# Patient Record
Sex: Male | Born: 1955 | Race: White | Hispanic: No | Marital: Married | Smoking: Former smoker
Health system: Southern US, Community
[De-identification: ages and names within clinical notes are randomized; demographics above are authoritative.]

## PROBLEM LIST (undated history)

## (undated) DIAGNOSIS — L409 Psoriasis, unspecified: Secondary | ICD-10-CM

## (undated) DIAGNOSIS — Z9221 Personal history of antineoplastic chemotherapy: Secondary | ICD-10-CM

## (undated) DIAGNOSIS — C099 Malignant neoplasm of tonsil, unspecified: Secondary | ICD-10-CM

## (undated) DIAGNOSIS — B370 Candidal stomatitis: Principal | ICD-10-CM

## (undated) DIAGNOSIS — B20 Human immunodeficiency virus [HIV] disease: Secondary | ICD-10-CM

## (undated) DIAGNOSIS — C801 Malignant (primary) neoplasm, unspecified: Secondary | ICD-10-CM

## (undated) DIAGNOSIS — K5900 Constipation, unspecified: Secondary | ICD-10-CM

## (undated) DIAGNOSIS — I890 Lymphedema, not elsewhere classified: Principal | ICD-10-CM

## (undated) DIAGNOSIS — Z21 Asymptomatic human immunodeficiency virus [HIV] infection status: Secondary | ICD-10-CM

## (undated) DIAGNOSIS — R070 Pain in throat: Principal | ICD-10-CM

## (undated) DIAGNOSIS — Z923 Personal history of irradiation: Secondary | ICD-10-CM

## (undated) DIAGNOSIS — H109 Unspecified conjunctivitis: Secondary | ICD-10-CM

## (undated) DIAGNOSIS — B1920 Unspecified viral hepatitis C without hepatic coma: Secondary | ICD-10-CM

## (undated) HISTORY — DX: Unspecified conjunctivitis: H10.9

## (undated) HISTORY — DX: Candidal stomatitis: B37.0

## (undated) HISTORY — DX: Pain in throat: R07.0

## (undated) HISTORY — DX: Psoriasis, unspecified: L40.9

## (undated) HISTORY — DX: Asymptomatic human immunodeficiency virus (hiv) infection status: Z21

## (undated) HISTORY — DX: Lymphedema, not elsewhere classified: I89.0

## (undated) HISTORY — DX: Malignant (primary) neoplasm, unspecified: C80.1

## (undated) HISTORY — DX: Unspecified viral hepatitis C without hepatic coma: B19.20

## (undated) HISTORY — DX: Human immunodeficiency virus (HIV) disease: B20

## (undated) HISTORY — DX: Constipation, unspecified: K59.00

---
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
## 1999-11-24 ENCOUNTER — Emergency Department (HOSPITAL_COMMUNITY): Admission: EM | Admit: 1999-11-24 | Discharge: 1999-11-24 | Payer: Self-pay | Admitting: *Deleted

## 2001-08-17 ENCOUNTER — Ambulatory Visit (HOSPITAL_COMMUNITY): Admission: RE | Admit: 2001-08-17 | Discharge: 2001-08-17 | Payer: Self-pay | Admitting: Gastroenterology

## 2001-10-05 ENCOUNTER — Ambulatory Visit (HOSPITAL_COMMUNITY): Admission: RE | Admit: 2001-10-05 | Discharge: 2001-10-05 | Payer: Self-pay | Admitting: Gastroenterology

## 2001-10-05 ENCOUNTER — Encounter: Payer: Self-pay | Admitting: Gastroenterology

## 2001-10-05 ENCOUNTER — Encounter (INDEPENDENT_AMBULATORY_CARE_PROVIDER_SITE_OTHER): Payer: Self-pay | Admitting: *Deleted

## 2001-11-02 ENCOUNTER — Ambulatory Visit (HOSPITAL_COMMUNITY): Admission: RE | Admit: 2001-11-02 | Discharge: 2001-11-02 | Payer: Self-pay | Admitting: Gastroenterology

## 2001-11-15 HISTORY — PX: LIVER BIOPSY: SHX301

## 2002-04-10 ENCOUNTER — Encounter: Payer: Self-pay | Admitting: Emergency Medicine

## 2002-04-10 ENCOUNTER — Emergency Department (HOSPITAL_COMMUNITY): Admission: EM | Admit: 2002-04-10 | Discharge: 2002-04-10 | Payer: Self-pay | Admitting: Physical Therapy

## 2007-02-02 ENCOUNTER — Ambulatory Visit: Payer: Self-pay | Admitting: Gastroenterology

## 2007-10-17 ENCOUNTER — Ambulatory Visit: Payer: Self-pay | Admitting: Gastroenterology

## 2008-11-15 HISTORY — PX: OTHER SURGICAL HISTORY: SHX169

## 2013-11-15 HISTORY — PX: OTHER SURGICAL HISTORY: SHX169

## 2014-03-15 ENCOUNTER — Other Ambulatory Visit: Payer: Self-pay | Admitting: Family Medicine

## 2014-03-15 DIAGNOSIS — R221 Localized swelling, mass and lump, neck: Secondary | ICD-10-CM

## 2014-03-20 ENCOUNTER — Ambulatory Visit
Admission: RE | Admit: 2014-03-20 | Discharge: 2014-03-20 | Disposition: A | Discharge status: Home / Self Care | Payer: BC Managed Care – PPO | Source: Ambulatory Visit | Attending: Family Medicine | Admitting: Family Medicine

## 2014-03-20 DIAGNOSIS — R221 Localized swelling, mass and lump, neck: Secondary | ICD-10-CM

## 2014-03-20 MED ORDER — IOHEXOL 300 MG/ML  SOLN
75.0000 mL | Freq: Once | INTRAMUSCULAR | Status: AC | PRN
  Administered 2014-03-20: 75 mL via INTRAVENOUS

## 2014-03-25 ENCOUNTER — Other Ambulatory Visit: Payer: Self-pay | Admitting: Otolaryngology

## 2014-03-25 DIAGNOSIS — C099 Malignant neoplasm of tonsil, unspecified: Secondary | ICD-10-CM

## 2014-03-25 HISTORY — DX: Malignant neoplasm of tonsil, unspecified: C09.9

## 2014-04-01 ENCOUNTER — Other Ambulatory Visit (HOSPITAL_COMMUNITY): Payer: Self-pay | Admitting: Otolaryngology

## 2014-04-01 ENCOUNTER — Other Ambulatory Visit (HOSPITAL_COMMUNITY): Payer: Self-pay

## 2014-04-01 DIAGNOSIS — C099 Malignant neoplasm of tonsil, unspecified: Secondary | ICD-10-CM

## 2014-04-03 ENCOUNTER — Telehealth: Payer: Self-pay | Admitting: *Deleted

## 2014-04-03 NOTE — Telephone Encounter (Signed)
Per discussion at ENT Conference this morning, called patient to offer attendance at the [facility] the afternoon of 04/17/14 as an option to his Consult appt with Dr. Isidore Moos that morning.  He stated he appreciated the opportunity to meet with several practitoners during the same visit and agreed to clinic attendance.  Initiating navigation as L1 patient (new patient) with this encounter.  Gayleen Orem, RN, BSN, [facility] Head & Neck Oncology Navigator [phone]

## 2014-04-10 ENCOUNTER — Telehealth: Payer: Self-pay | Admitting: *Deleted

## 2014-04-10 NOTE — Telephone Encounter (Signed)
Returned patient's call, answered his questions regarding the [facility] [place] he will be attending next week, the usual  treatment plan for his dx.  He expressed that he has not told his children yet about his dx b/c his son was just married a couple of weeks ago and he wants to have additional information before telling them.  He asked about suggested approach to sharing news, I suggested that he give Polo Riley, CSW, a call to see what assistance she might provide.  He understands that he will be seeing Lauren next week during [place].  He asked about obtaining a second opinion, I encouraged him to ask for a referral next week when he meets with Drs. Alvy Bimler and Isidore Moos.  I explained that the Tom[facility] we offer is state of the art.  Continuing navigation as L1 patient (new patient).

## 2014-04-11 ENCOUNTER — Encounter: Payer: Self-pay | Admitting: Radiation Oncology

## 2014-04-11 NOTE — Progress Notes (Signed)
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 Jodi Marble with a 1 month history of "slight uncomfortable dyshagia, but reported a slight dry cough", no hoarseness or swallowing difficulties." Also has mild right referred otalgia. He was examined by his Medical Physician for strep which was negative and then examined by his dentist who did not discover any abnormalities.  He then had a CT of the neck which showed several apparent necrotic centered nodes in the RIGHT neck level II,III.  Possible asymmetry in the RIGHT parapharyngeal space but difficult to assess due to dental artifact  Biopsies of the Right Tonsil (if applicable) revealed:  02/02/21  The tumor cells are strongly and diffusely positive for p16, HR-HPV surrogate marker  Tonsil, biopsy, right, biopsy - INVASIVE SQUAMOUS CELL CARCINOMA  Nutrition Status:  Weight changes: No  Swallowing status: "mild"  Plans, if any, for PEG tube: None yet  Tobacco/Marijuana/Snuff/ETOH use: smoked 1 PPD x 25 years, stopped 2 years ago.  Past/Anticipated interventions by otolaryngology, if QMG:NOIBBC of Right Tonsil   Past/Anticipated interventions by medical oncology, if any: Unknown  Referrals yet, to any of the following?  Social Work? NO  Dentistry? Personal Dentist Prior to Diaganosis  Swallowing therapy?  Nutrition? NO  Med/Onc? NO  PEG placement? No  SAFETY ISSUES:  Prior radiation? No  Pacemaker/ICD? No  Possible current pregnancy? N/A  Is the patient on methotrexate? 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  Liver Biopsy 2005 -negative

## 2014-04-12 ENCOUNTER — Telehealth: Payer: Self-pay | Admitting: Hematology and Oncology

## 2014-04-12 NOTE — Telephone Encounter (Signed)
C/D 04/12/14 for appt. 04/17/14 °

## 2014-04-15 ENCOUNTER — Ambulatory Visit (HOSPITAL_COMMUNITY)
Admission: RE | Admit: 2014-04-15 | Discharge: 2014-04-15 | Disposition: A | Discharge status: Home / Self Care | Payer: BC Managed Care – PPO | Source: Ambulatory Visit | Attending: Otolaryngology | Admitting: Otolaryngology

## 2014-04-15 DIAGNOSIS — R911 Solitary pulmonary nodule: Secondary | ICD-10-CM | POA: Insufficient documentation

## 2014-04-15 DIAGNOSIS — I700 Atherosclerosis of aorta: Secondary | ICD-10-CM | POA: Insufficient documentation

## 2014-04-15 DIAGNOSIS — C099 Malignant neoplasm of tonsil, unspecified: Secondary | ICD-10-CM | POA: Insufficient documentation

## 2014-04-15 LAB — GLUCOSE, CAPILLARY: Glucose-Capillary: 91 mg/dL (ref 70–99)

## 2014-04-15 MED ORDER — FLUDEOXYGLUCOSE F - 18 (FDG) INJECTION
9.9000 | Freq: Once | INTRAVENOUS | Status: AC | PRN
  Administered 2014-04-15: 9.9 via INTRAVENOUS

## 2014-04-16 ENCOUNTER — Telehealth: Payer: Self-pay | Admitting: *Deleted

## 2014-04-16 NOTE — Telephone Encounter (Signed)
Called patient to answer any additional questions prior to his attendance at the [facility] tomorrow afternoon, reminded him of his 12:15 arrival time, reviewed the check-in process.  He indicated that his wife will be joining him.  Gayleen Orem, RN, BSN, [facility] Head & Neck Oncology Navigator [phone]

## 2014-04-17 ENCOUNTER — Ambulatory Visit: Payer: BC Managed Care – PPO | Attending: Radiation Oncology | Admitting: Physical Therapy

## 2014-04-17 ENCOUNTER — Ambulatory Visit
Admission: RE | Admit: 2014-04-17 | Discharge: 2014-04-17 | Disposition: A | Discharge status: Home / Self Care | Payer: BC Managed Care – PPO | Source: Ambulatory Visit | Attending: Radiation Oncology | Admitting: Radiation Oncology

## 2014-04-17 ENCOUNTER — Encounter: Payer: Self-pay | Admitting: *Deleted

## 2014-04-17 ENCOUNTER — Ambulatory Visit: Payer: BC Managed Care – PPO

## 2014-04-17 ENCOUNTER — Encounter: Payer: Self-pay | Admitting: Hematology and Oncology

## 2014-04-17 ENCOUNTER — Ambulatory Visit: Payer: BC Managed Care – PPO | Admitting: Nutrition

## 2014-04-17 ENCOUNTER — Encounter: Payer: Self-pay | Admitting: Radiation Oncology

## 2014-04-17 ENCOUNTER — Ambulatory Visit (HOSPITAL_BASED_OUTPATIENT_CLINIC_OR_DEPARTMENT_OTHER): Payer: BC Managed Care – PPO | Admitting: Hematology and Oncology

## 2014-04-17 ENCOUNTER — Ambulatory Visit: Payer: BC Managed Care – PPO | Admitting: Radiation Oncology

## 2014-04-17 VITALS — BP 132/71 | HR 67 | Temp 97.7°F | Resp 16 | Ht 68.0 in | Wt 186.6 lb

## 2014-04-17 DIAGNOSIS — B977 Papillomavirus as the cause of diseases classified elsewhere: Secondary | ICD-10-CM

## 2014-04-17 DIAGNOSIS — C099 Malignant neoplasm of tonsil, unspecified: Secondary | ICD-10-CM

## 2014-04-17 DIAGNOSIS — L409 Psoriasis, unspecified: Secondary | ICD-10-CM

## 2014-04-17 DIAGNOSIS — L408 Other psoriasis: Secondary | ICD-10-CM

## 2014-04-17 DIAGNOSIS — R293 Abnormal posture: Secondary | ICD-10-CM | POA: Insufficient documentation

## 2014-04-17 DIAGNOSIS — IMO0001 Reserved for inherently not codable concepts without codable children: Secondary | ICD-10-CM | POA: Insufficient documentation

## 2014-04-17 DIAGNOSIS — Z8619 Personal history of other infectious and parasitic diseases: Secondary | ICD-10-CM | POA: Insufficient documentation

## 2014-04-17 DIAGNOSIS — B1920 Unspecified viral hepatitis C without hepatic coma: Secondary | ICD-10-CM

## 2014-04-17 HISTORY — DX: Malignant neoplasm of tonsil, unspecified: C09.9

## 2014-04-17 NOTE — Assessment & Plan Note (Signed)
We discussed his case in a multidisciplinary fashion and reviewed his pathology and imaging at the most recent tumor Board. I recommend concurrent chemoradiation therapy with high-dose cisplatin.  In preparation for treatment, the patient will need the following tests or referrals, to be arranged   #1 Referral to dentist for full dental evaluation and possible dental extraction  #2 Referral for feeding tube placement.  #3 Infusaport placement #4 Referral to Speech Pathologist  #5 Referral to Nutritionist  #6 Referral to Social Worker  #7 Referral to chemotherapy class to learn about practical tips while on treatment.  #8 Blood work

## 2014-04-17 NOTE — Progress Notes (Signed)
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 male who presented in early April with right neck swelling.  Strep culture was negative, and blood tests unremarkable.  He underwent a CT of his neck on 03-20-14 revealing: 1. There are several complex solid and cystic lesions within the  right neck corresponding with the patient's palpable concern. The  multiplicity is most consistent with necrotic lymphadenopathy and  worrisome for underlying squamous cell carcinoma of the head and  neck. No clear primary malignancy identified, although there is  asymmetric low-density in the right parapharyngeal space. An  inflammatory process is considered less likely.  He was referred to Dr. Erik Obey who appreciated a firm area in the right tonsil.  He performed a right tonsil biopsy on 03-25-14.  Pathology revealed:  INVASIVE SQUAMOUS CELL CARCINOMA; The tumor cells are strongly and diffusely positive for p16, HR-HPV surrogate marker.  PET on 04-15-14 showed: 1. Asymmetric increased radiotracer uptake within the right  parapharyngeal space which may represent site of primary head neck  neoplasm.  2. Multiple hypermetabolic right level 2 lymph nodes compatible with  metastatic adenopathy.  3. No evidence for hypermetabolic metastasis within the chest,  abdomen or pelvis.   He has lost 3 lbs.  No dysphagia, but swallowing sometimes feels "weird."  He smoked 1 PPD x 25 years, and stopped 2 years ago.  He works for [employer], and lives in [place]. 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    PREVIOUS RADIATION THERAPY: No  PAST MEDICAL HISTORY:  has a past medical history of Tonsillar cancer (03/25/14); Cancer; Hepatitis C; and Psoriasis.    PAST SURGICAL HISTORY: Past Surgical History  Procedure Laterality Date  . Splenectomy, total      at the age of 38  . Colonscopy  2010  . Tonsil biopsy    . Liver biopsy      FAMILY HISTORY: family history is negative for Cancer.  SOCIAL HISTORY:  reports that he quit smoking about 2 years ago. His smoking use included Cigarettes. He has a 30 pack-year smoking history. He has never used smokeless tobacco. He reports that he drinks alcohol. He reports that he does not use illicit drugs.  ALLERGIES: Sulfa antibiotics  MEDICATIONS:  No current outpatient prescriptions on file.   No current facility-administered medications for this encounter.    REVIEW OF SYSTEMS:  Notable for that above.   PHYSICAL EXAM:  height is 5' 8"  (1.727 m) and weight is 186 lb 9.6 oz (84.641 kg). His oral temperature is 97.7 F (36.5 C). His blood pressure is 132/71 and his pulse is 67. His respiration is 16 and oxygen saturation is 100%.   General: Alert and oriented, in no acute distress HEENT: Head is normocephalic. Pupils are equally round and reactive to light. Extraocular movements are intact. Oropharynx- slight fullness in right tonsil. Neck: ~3cm lymph node conglomerate in right level 2/3.  no palpable left cervical or bilateral supraclavicular lymphadenopathy. Heart: Regular in rate and rhythm with  no murmurs, rubs, or gallops. Chest: Clear to auscultation bilaterally, with no rhonchi, wheezes, or rales. Abdomen: Soft, nontender, nondistended, with no rigidity or guarding. Extremities: No cyanosis or edema. Lymphatics: No concerning lymphadenopathy. Skin: various psoriatic lesions Musculoskeletal: symmetric strength and muscle tone throughout. Neurologic: Cranial  nerves II through XII are grossly intact. No obvious focalities. Speech is fluent. Coordination is intact. Psychiatric: Judgment and insight are intact. Affect is appropriate.   ECOG = 0  0 - Asymptomatic (Fully active, able to carry on all predisease activities without restriction)  1 - Symptomatic but completely ambulatory (Restricted in physically strenuous activity but ambulatory and able to carry out work of a light or sedentary nature. For example, light housework, office work)  2 - Symptomatic, <50% in bed during the day (Ambulatory and capable of all self care but unable to carry out any work activities. Up and about more than 50% of waking hours)  3 - Symptomatic, >50% in bed, but not bedbound (Capable of only limited self-care, confined to bed or chair 50% or more of waking hours)  4 - Bedbound (Completely disabled. Cannot carry on any self-care. Totally confined to bed or chair)  5 - Death   Eustace Pen MM, Creech RH, Tormey DC, et al. [phone]). "Toxicity and response criteria of the [facility]". [place] Oncol. 5 (6): 649-55   LABORATORY DATA:  No results found for this basename: WBC, HGB, HCT, MCV, PLT   CMP  No results found for this basename: na, k, cl, co2, glucose, bun, creatinine, calcium, prot, albumin, ast, alt, alkphos, bilitot, gfrnonaa, gfraa        RADIOGRAPHY: Ct Soft Tissue Neck W Contrast  03/20/2014   CLINICAL DATA:  58 year old with right neck mass for 3 weeks. Some dysphagia. Evaluate for lipoma versus tumor.  EXAM: CT NECK WITH CONTRAST  TECHNIQUE: Multidetector CT imaging of the neck was performed using the standard protocol following the bolus administration of intravenous contrast.  CONTRAST:  74m OMNIPAQUE IOHEXOL 300 MG/ML  SOLN  COMPARISON:  None.  FINDINGS: Capsules were placed laterally over the patient's palpable concern in the mid right neck. There are several underlying complex masses within the right neck. These demonstrate  central low density, thickened peripheral septations and a few small calcifications. The dominant component measures up to 2.5 x 1.9 x 3.9 cm. This is most consistent with a necrotic level III lymph node. There are additional separate smaller IIA and IIB lymph nodes.  No enlarged lymph nodes are identified within the left neck. There is no generalized inflammatory changes.  No lesions of the pharyngeal mucosal space are seen. There is asymmetric low-density within the right parapharyngeal fat (image 25). This area is suboptimally evaluated due to artifact from the patient's dental ware. The thyroid, submandibular and parotid glands appear normal.  The mastoid air cells and middle ears are clear. The visualized paranasal sinuses are clear. No intracranial abnormalities are identified. The cervical spine appears normal. The lung apices are clear with mild emphysematous changes.  IMPRESSION: 1. There are several complex solid and cystic lesions within the right neck corresponding with the patient's palpable concern. The multiplicity is most consistent with necrotic lymphadenopathy and worrisome for underlying squamous cell carcinoma of the head and neck. No clear primary malignancy identified, although there is asymmetric low-density in the right parapharyngeal space. An inflammatory process is considered less likely. 2. ENT consultation and tissue sampling recommended. 3. These results will be called to the ordering clinician  or representative by the Radiologist Assistant, and communication documented in the PACS Dashboard.   Electronically Signed   By: Camie Patience M.D.   On: 03/20/2014 17:27   Nm Pet Image Initial (pi) Skull Base To Thigh  04/15/2014   CLINICAL DATA:  Initial treatment strategy for tonsillar cancer.  EXAM: NUCLEAR MEDICINE PET SKULL BASE TO THIGH  TECHNIQUE: 9.9 mCi F-18 FDG was injected intravenously. Full-ring PET imaging was performed from the skull base to thigh after the radiotracer. CT data  was obtained and used for attenuation correction and anatomic localization.  FASTING BLOOD GLUCOSE:  Value: 91 mg/dl  COMPARISON:  CT 03/20/2014  FINDINGS: NECK  There is subtle, asymmetric increased radiotracer activity localizing to the right parapharyngeal region. This has an SUV max equal to 6.8. Multi focal hypermetabolic level 2 lymph nodes are identified within the right side of neck. Index right level 2 lymph node has an SUV max equal to 3.5. More laterally there is a necrotic appearing level 2 lymph node within SUV max equal to 3.3. No hypermetabolic contralateral lymph nodes identified.  CHEST  No hypermetabolic mediastinal or hilar nodes. Subpleural nodule along the minor fissure measures 7 mm. No significant FDG uptake is associated with this nodule which is favored to represent a fissural lymph node. The heart size appears normal. There is calcified atherosclerotic disease involving the thoracic and abdominal aorta.  ABDOMEN/PELVIS  No abnormal hypermetabolic activity within the liver, pancreas, adrenal glands, or spleen. Multiple sub cm lymph nodes are identified within the abdomen. No adenopathy identified however. No hypermetabolic lymph nodes in the abdomen or pelvis.  SKELETON  No focal hypermetabolic activity to suggest skeletal metastasis.  IMPRESSION: 1. Asymmetric increased radiotracer uptake within the right parapharyngeal space which may represent site of primary head neck neoplasm. 2. Multiple hypermetabolic right level 2 lymph nodes compatible with metastatic adenopathy. 3. No evidence for hypermetabolic metastasis within the chest, abdomen or pelvis.   Electronically Signed   By: Kerby Moors M.D.   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 He is an excellent candidate for radiotherapy. Plan is as below:   1) He has met with med/onc  to discuss chemotherapy - anticipate concurrent ChRT   1a) Getting second opinion tomorrow with oncologists at [facility] encouraged him to keep these appointments. He is leaning towards keeping care close to home in [place], but will let us know if he decides otherwise.  2 He has been referred to Dr Enrique Sack for dental evaluation/extractions as needed +/- scatter guards   3) today in multidisciplinary clinic he will see Polo Riley from social work for social support  4) today in multidisciplinary clinic he will see nutrition for nutrition support - anticipate PEG tube. We discussed the rationale for this in depth.  4a) Medical Oncology will eventually refer to surgery for PEG tube placement.   5) Will refer to swallowing therapy for dysphagia which can occur during or after chemoradiotherapy.   6) Simulation once cleared by dentistry. Anticipate 7 weeks of RT - 70 Gy in 35 fractions.   7) PT referral for pre-RT assessment / neck measurements due to risk of lymphedema in neck; may benefit from PT for this after completion of radiotherapy. He also may benefit from this for deconditioning after treatment      It was a pleasure  meeting the patient today. We discussed the risks, benefits, and side effects of adjuvant radiotherapy. We talked in detail about acute and late effects. He understands that some of the most bothersome acute effects will be significant soreness of the mouth and throat, changes in taste, changes in salivary function, skin irritation, hair loss, dehydration, weight loss and fatigue. We talked about late effects which include but are not necessarily limited to dysphagia, hypothyroidism, nerve injury, spinal cord injury, dry mouth, trismus, and neck edema. No guarantees of treatment were given. A consent form was signed and placed in the patient's medical record. The patient is enthusiastic about proceeding with treatment. I look forward to participating in the patient's care.    __________________________________________   Eppie Gibson, MD

## 2014-04-17 NOTE — Assessment & Plan Note (Addendum)
He had a liver biopsy which was negative for liver cirrhosis. I would recheck a hepatitis C panel with the next blood draw. To his knowledge, this is not an active infection. It should not impact on decision making regarding his current treatment for tonsil cancer.

## 2014-04-17 NOTE — Progress Notes (Signed)
Pt is a 58 y/o male with Dx of Tonsil CA that will begin chemo and XRT.  Pt seen in clinic today.     HT 68" Wt 187 lbs UBW 187 lbs BMI 28.43  Pt reports good appetite and po intake.  No problems swallowing or wt loss.    Nutrition Dx:  Food and Nutrition related knowledge deficit related to new Dx of Tonsil CA and associated tx as evidenced by no prior need for nutrition related information.  Intervention:  Pt was educated on the importance of good nutrition and wt maintenance during tx.  Encouraged 6 small high kcal/high protein meals/snacks with supplements.  Reviewed tips on coping with sore throat and ways to increase kcals and protein with beverages.  Coupons for Boost plus and Ensure plus provided with recipes for shakes and smoothies.  Pt and pt's wife seemed to have a good understanding of the information and seemed motivated to follow recommendations. Also discussed PEG tube and benefits, if needed, pt open to PEG to help prevent malnutrition, will discuss with MD.   Monitoring, evaluation, goals:  Pt will tolerate adequate kcal and protein to promote wt maintenance and healing.   Next visit:  To be scheduled.

## 2014-04-17 NOTE — Assessment & Plan Note (Signed)
I warned the patient that his psoriasis might flare during treatment due to stress. I recommend we observe this carefully. He does not require any treatment for that right now.

## 2014-04-17 NOTE — Progress Notes (Signed)
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 male is here because of newly diagnosed tonsillar cancer According to the patient, the first initial presentation was due to a right neck swelling with associated sore throat. He saw his primary care provider for evaluation when it did not get better. He also saw the dentist for evaluation for possible dental abscess. CT scan show abnormalities and he was subsequently referred to ENT and had a biopsy which confirmed the diagnosis he denies any hearing deficit, difficulties with chewing food, swallowing difficulties, painful swallowing or abnormal weight loss. His wife thought that he may have changes in his voice. Oncology History   Tonsil cancer, Right, HPV positive,    Primary site: Pharynx - Oropharynx   Staging method: AJCC 7th Edition   Clinical: Stage IVA (T1, N2, M0) signed by Heath Lark, MD on 04/17/2014  1:04 PM   Summary: Stage IVA (T1, N2, M0)       Tonsil cancer   03/20/2014 Imaging Ct scan of neck showed several complex solid and cystic lesions within the right neck and abnormalities in the pharynx   03/25/2014 Procedure 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 Multiple hypermetabolic right level 2 lymph nodes compatible with metastatic adenopathy    MEDICAL HISTORY:  Past Medical History  Diagnosis Date  . 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 Cancer   . Hepatitis C   . Psoriasis     SURGICAL  HISTORY: Past Surgical History  Procedure Laterality Date  . 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 Colonscopy  2010  . Tonsil biopsy    . Liver biopsy      SOCIAL HISTORY: History   Social History  . Marital Status: Married    Spouse Name: N/A    Number of Children: N/A  . Years of Education: N/A   Occupational History  . Not on file.   Social History Main Topics  . Smoking status: Former Smoker -- 1.00 packs/day for 30 years    Types: Cigarettes    Quit date: 11/16/2011  . Smokeless tobacco: Never Used  . Alcohol Use: Yes     Comment: occasional   . Drug Use: No  . Sexual Activity: Yes   Other Topics Concern  . Not on file   Social History Narrative  . No narrative on file    FAMILY HISTORY: Family History  Problem Relation Age of Onset  . Cancer Neg Hx     ALLERGIES:  is allergic to sulfa antibiotics.  MEDICATIONS:  No current outpatient prescriptions on file.   No current facility-administered medications for this visit.    REVIEW OF SYSTEMS:   Constitutional: Denies fevers, chills or abnormal night sweats Eyes: Denies blurriness of vision, double vision or watery eyes Respiratory: Denies cough, dyspnea or wheezes Cardiovascular: Denies palpitation, chest discomfort or lower extremity swelling Gastrointestinal:  Denies nausea, heartburn or change in bowel habits Neurological:Denies numbness, tingling or new weaknesses Behavioral/Psych: Mood is stable, no  new changes  All other systems were reviewed with the patient and are negative.  PHYSICAL EXAMINATION: ECOG PERFORMANCE STATUS: 1 - Symptomatic but completely ambulatory  BP 132/71 HR 67 RR 16 Temp: 36.5  GENERAL:alert, no distress and comfortable SKIN: Noted multiple psoriatic plaques. He has very dry skin.  EYES: normal, conjunctiva are pink and non-injected, sclera clear OROPHARYNX:no exudate, no erythema and lips, buccal mucosa, and tongue normal  NECK: supple, thyroid normal  size, non-tender, without nodularity LYMPH:  Noted palpable multiple lymphadenopathy in the right side of the neck. Non-elsewhere. LUNGS: clear to auscultation and percussion with normal breathing effort HEART: regular rate & rhythm and no murmurs and no lower extremity edema ABDOMEN:abdomen soft, non-tender and normal bowel sounds Musculoskeletal:no cyanosis of digits and no clubbing  PSYCH: alert & oriented x 3 with fluent speech NEURO: no focal motor/sensory deficits  RADIOGRAPHIC STUDIES: I have personally reviewed the radiological images as listed and agreed with the findings in the report. Ct Soft Tissue Neck W Contrast  03/20/2014   CLINICAL DATA:  58 year old with right neck mass for 3 weeks. Some dysphagia. Evaluate for lipoma versus tumor.  EXAM: CT NECK WITH CONTRAST  TECHNIQUE: Multidetector CT imaging of the neck was performed using the standard protocol following the bolus administration of intravenous contrast.  CONTRAST:  71mL OMNIPAQUE IOHEXOL 300 MG/ML  SOLN  COMPARISON:  None.  FINDINGS: Capsules were placed laterally over the patient's palpable concern in the mid right neck. There are several underlying complex masses within the right neck. These demonstrate central low density, thickened peripheral septations and a few small calcifications. The dominant component measures up to 2.5 x 1.9 x 3.9 cm. This is most consistent with a necrotic level III lymph node. There are additional separate smaller IIA and IIB lymph nodes.  No enlarged lymph nodes are identified within the left neck. There is no generalized inflammatory changes.  No lesions of the pharyngeal mucosal space are seen. There is asymmetric low-density within the right parapharyngeal fat (image 25). This area is suboptimally evaluated due to artifact from the patient's dental ware. The thyroid, submandibular and parotid glands appear normal.  The mastoid air cells and middle ears are clear. The visualized paranasal sinuses are  clear. No intracranial abnormalities are identified. The cervical spine appears normal. The lung apices are clear with mild emphysematous changes.  IMPRESSION: 1. There are several complex solid and cystic lesions within the right neck corresponding with the patient's palpable concern. The multiplicity is most consistent with necrotic lymphadenopathy and worrisome for underlying squamous cell carcinoma of the head and neck. No clear primary malignancy identified, although there is asymmetric low-density in the right parapharyngeal space. An inflammatory process is considered less likely. 2. ENT consultation and tissue sampling recommended. 3. These results will be called to the ordering clinician or representative by the Radiologist Assistant, and communication documented in the PACS Dashboard.   Electronically Signed   By: Camie Patience M.D.   On: 03/20/2014 17:27   Nm Pet Image Initial (pi) Skull Base To Thigh  04/15/2014   CLINICAL DATA:  Initial treatment strategy for tonsillar cancer.  EXAM: NUCLEAR MEDICINE PET SKULL BASE TO THIGH  TECHNIQUE: 9.9 mCi F-18 FDG was injected intravenously. Full-ring PET imaging was performed from the skull base to thigh after the radiotracer. CT data was obtained and used for attenuation correction and anatomic localization.  FASTING BLOOD GLUCOSE:  Value: 91 mg/dl  COMPARISON:  CT 03/20/2014  FINDINGS: NECK  There is  subtle, asymmetric increased radiotracer activity localizing to the right parapharyngeal region. This has an SUV max equal to 6.8. Multi focal hypermetabolic level 2 lymph nodes are identified within the right side of neck. Index right level 2 lymph node has an SUV max equal to 3.5. More laterally there is a necrotic appearing level 2 lymph node within SUV max equal to 3.3. No hypermetabolic contralateral lymph nodes identified.  CHEST  No hypermetabolic mediastinal or hilar nodes. Subpleural nodule along the minor fissure measures 7 mm. No significant FDG uptake  is associated with this nodule which is favored to represent a fissural lymph node. The heart size appears normal. There is calcified atherosclerotic disease involving the thoracic and abdominal aorta.  ABDOMEN/PELVIS  No abnormal hypermetabolic activity within the liver, pancreas, adrenal glands, or spleen. Multiple sub cm lymph nodes are identified within the abdomen. No adenopathy identified however. No hypermetabolic lymph nodes in the abdomen or pelvis.  SKELETON  No focal hypermetabolic activity to suggest skeletal metastasis.  IMPRESSION: 1. Asymmetric increased radiotracer uptake within the right parapharyngeal space which may represent site of primary head neck neoplasm. 2. Multiple hypermetabolic right level 2 lymph nodes compatible with metastatic adenopathy. 3. No evidence for hypermetabolic metastasis within the chest, abdomen or pelvis.   Electronically Signed   By: Kerby Moors M.D.   On: 04/15/2014 10:46    ASSESSMENT:  Newly diagnosed squamous cell carcinoma of the Head & Neck, HPV Positive Tonsil cancer We discussed his case in a multidisciplinary fashion and reviewed his pathology and imaging at the most recent tumor Board. I recommend concurrent chemoradiation therapy with high-dose cisplatin.  In preparation for treatment, the patient will need the following tests or referrals, to be arranged   #1 Referral to dentist for full dental evaluation and possible dental extraction  #2 Referral for feeding tube placement.  #3 Infusaport placement #4 Referral to Speech Pathologist  #5 Referral to Nutritionist  #6 Referral to Social Worker  #7 Referral to chemotherapy class to learn about practical tips while on treatment.  #8 Blood work        Hepatitis C He had a liver biopsy which was negative for liver cirrhosis. I would recheck a hepatitis C panel with the next blood draw. To his knowledge, this is not an active infection. It should not impact on decision making regarding  his current treatment for tonsil cancer.  Psoriasis I warned the patient that his psoriasis might flare during treatment due to stress. I recommend we observe this carefully. He does not require any treatment for that right now.   We discussed also about prognosis and natural history of HPV positive tonsil cancer.  Orders Placed This Encounter  Procedures  . CBC with Differential    Standing Status: Standing     Number of Occurrences: 3     Standing Expiration Date: 04/18/2015  . Comprehensive metabolic panel    Standing Status: Standing     Number of Occurrences: 3     Standing Expiration Date: 04/18/2015  . Magnesium    Standing Status: Standing     Number of Occurrences: 3     Standing Expiration Date: 04/18/2015  . Ambulatory Referral to General Surgery for Portacath & Open Gastrostomy Tube    Referral Priority:  Routine    Referral Type:  Surgical    Referral Reason:  Specialty Services Required    Requested Specialty:  General Surgery    Number of Visits Requested:  1  .  Ambulatory Referral to Speech Therapy  (specifically to Garald Balding)    Referral Priority:  Routine    Referral Type:  Speech Therapy    Referral Reason:  Specialty Services Required    Requested Specialty:  Speech Pathology    Number of Visits Requested:  1  . Ambulatory Referral to Dentistry (specifically to Dr. Enrique Sack)    Referral Priority:  Routine    Referral Type:  Consultation    Referral Reason:  Specialty Services Required    Requested Specialty:  Dental General Practice    Number of Visits Requested:  1  . Amb Referral to Nutrition and Diabetic Education (specifically to Ernestene Kiel)    Referral Priority:  Routine    Referral Type:  Consultation    Referral Reason:  Specialty Services Required    Number of Visits Requested:  1  . Ambulatory Referral to Social Work    Referral Priority:  Routine    Referral Type:  Consultation    Referral Reason:  Specialty Services Required    Number of  Visits Requested:  1  . Ambulatory Referral to Physical Therapy    Referral Priority:  Routine    Referral Type:  Physical Medicine    Referral Reason:  Specialty Services Required    Requested Specialty:  Physical Therapy    Number of Visits Requested:  1    All questions were answered. The patient knows to call the clinic with any problems, questions or concerns.    Heath Lark, MD 04/17/2014 7:07 PM

## 2014-04-17 NOTE — Progress Notes (Signed)
Head & Neck Multidisciplinary Clinic Clinical Social Work  Clinical Social Work met with patient/family at head & neck multidisciplinary clinic to offer support and assess for psychosocial needs.  Patient was accompanied by spouse at today's visit.  Christopher Mullins has two children ages 4 and 22, both living in [place].  Patient and spouse shared they are concerned with sharing diagnosis with children and how to support family through cancer process.  CSW provided patient/spouse with practical ideas of how to communicate and cope with cancer (examples: when and how to share patient has cancer, how to keep family and friends updated throughout process, and how to support patient's children).  Christopher Mullins works in [employer] with [employer] and reported employer is very supportive of patient "focusing on surviving and getting through this".  The patient reported his main concern at this time is "starting treatment".  He expressed feeling well supported by family, friends, and coworkers.  CSW will provide patient's spouse with information for cancer support services in [place]- possible counseling resource for patient's children.  Clinical Social Work briefly discussed Clinical Social Work role and [employer] support programs/services.  Clinical Social Work encouraged patient to call with any additional questions or concerns.  ONCBCN DISTRESS SCREENING 04/17/2014  Screening Type Initial Screening  Mark the number that describes how much distress you have been experiencing in the past week 3  Other seen by Merdis Delay, MSW, LCSW, OSW-C Clinical Social Worker [place] [phone]

## 2014-04-18 ENCOUNTER — Telehealth: Payer: Self-pay | Admitting: Hematology and Oncology

## 2014-04-18 NOTE — Telephone Encounter (Signed)
s.w. pt and advised on June appts....pt sched to see car on 6.8 @ 2:45pm///pt sched for PT on 6.10 @ 1:45pm///lvm for Dr. Lawana Chambers office///lvm ofr Mrs. Mullis///nut appt sched on 6.23....sed added tx....Marland Kitchen-pt ok and aware

## 2014-04-19 ENCOUNTER — Telehealth: Payer: Self-pay | Admitting: *Deleted

## 2014-04-19 ENCOUNTER — Other Ambulatory Visit (HOSPITAL_COMMUNITY): Payer: BC Managed Care – PPO | Admitting: Dentistry

## 2014-04-22 ENCOUNTER — Encounter: Payer: Self-pay | Admitting: *Deleted

## 2014-04-22 ENCOUNTER — Ambulatory Visit: Payer: BC Managed Care – PPO

## 2014-04-23 ENCOUNTER — Ambulatory Visit (HOSPITAL_COMMUNITY): Payer: Self-pay | Admitting: Dentistry

## 2014-04-23 ENCOUNTER — Encounter (HOSPITAL_COMMUNITY): Payer: Self-pay | Admitting: Dentistry

## 2014-04-23 ENCOUNTER — Other Ambulatory Visit (HOSPITAL_COMMUNITY): Payer: BC Managed Care – PPO | Admitting: Dentistry

## 2014-04-23 VITALS — BP 111/76 | HR 59 | Temp 98.5°F

## 2014-04-23 DIAGNOSIS — C099 Malignant neoplasm of tonsil, unspecified: Secondary | ICD-10-CM

## 2014-04-23 DIAGNOSIS — IMO0002 Reserved for concepts with insufficient information to code with codable children: Secondary | ICD-10-CM

## 2014-04-23 DIAGNOSIS — M270 Developmental disorders of jaws: Secondary | ICD-10-CM

## 2014-04-23 DIAGNOSIS — K0530 Chronic periodontitis, unspecified: Secondary | ICD-10-CM

## 2014-04-23 DIAGNOSIS — K045 Chronic apical periodontitis: Secondary | ICD-10-CM

## 2014-04-23 DIAGNOSIS — K036 Deposits [accretions] on teeth: Secondary | ICD-10-CM

## 2014-04-23 DIAGNOSIS — M264 Malocclusion, unspecified: Secondary | ICD-10-CM

## 2014-04-23 DIAGNOSIS — Z0189 Encounter for other specified special examinations: Secondary | ICD-10-CM

## 2014-04-23 DIAGNOSIS — K029 Dental caries, unspecified: Secondary | ICD-10-CM

## 2014-04-23 DIAGNOSIS — K08409 Partial loss of teeth, unspecified cause, unspecified class: Secondary | ICD-10-CM

## 2014-04-23 DIAGNOSIS — K08109 Complete loss of teeth, unspecified cause, unspecified class: Secondary | ICD-10-CM

## 2014-04-23 MED ORDER — SODIUM FLUORIDE 1.1 % DT GEL: DENTAL | Status: DC

## 2014-04-23 NOTE — Progress Notes (Signed)
DENTAL CONSULTATION  Date of Consultation:  04/23/2014 Patient Name:   Christopher Mullins Date of Birth:   06-20-1956 Medical Record Number: [id number]  VITALS: BP 111/76  Pulse 59  Temp(Src) 98.5 F (36.9 C) (Oral)   CHIEF COMPLAINT: Patient was referred for a pre-chemoradiation therapy dental consultation.  HPI: Christopher Mullins is a 58 year old male recently diagnosed with squamous cell carcinoma of the right tonsil. Patient with anticipated chemoradiation therapy. Patient is now seen as part of a medically necessary pre-chemoradiation therapy dental protocol examination.  The patient currently denies acute toothache, swellings, or abscesses. Patient was last seen for an exam and cleaning approximately September of 2014. Patient is followed by Dr. Nicki Reaper Minor as his primary dentist. The patient had an implant placed in the area of tooth #30 in 2013 by Dr. Sherren Mocha Owsley(oral surgeon). This currently has a healing abutment and no definitive crown restoration.   PROBLEM LIST: Patient Active Problem List   Diagnosis Date Noted  . Tonsil cancer 04/17/2014    Priority: High  . Hepatitis C 04/17/2014  . Psoriasis 04/17/2014    PMH: Past Medical History  Diagnosis Date  . 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 Cancer   . Hepatitis C   . Psoriasis     PSH: Past Surgical History  Procedure Laterality Date  . 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 Colonscopy  2010  . Tonsil biopsy    . Liver biopsy      ALLERGIES: Allergies  Allergen Reactions  . Sulfa Antibiotics     MEDICATIONS: No current outpatient prescriptions on file.   No current facility-administered medications for this visit.    LABS: No results found for this basename: WBC, HGB, HCT, MCV, PLT   No results found for this basename: na, k, cl, co2, glucose, bun, creatinine, calcium, gfrnonaa, gfraa   No results found for this basename: INR, PROTIME   No results found for this  basename: PTT    SOCIAL HISTORY: History   Social History  . Marital Status: Married    Spouse Name: N/A    Number of Children: N/A  . Years of Education: N/A   Occupational History  . Not on file.   Social History Main Topics  . Smoking status: Former Smoker -- 1.00 packs/day for 30 years    Types: Cigarettes    Quit date: 11/16/2011  . Smokeless tobacco: Never Used  . Alcohol Use: Yes     Comment: occasional   . Drug Use: No  . Sexual Activity: Yes   Other Topics Concern  . Not on file   Social History Narrative  . No narrative on file    FAMILY HISTORY: Family History  Problem Relation Age of Onset  . Cancer Neg Hx      REVIEW OF SYSTEMS: Reviewed with patient and included in the dental record.  Constitutional: Denies fevers, chills or abnormal night sweats  Eyes: Denies blurriness of vision, double vision or watery eyes  Respiratory: Denies cough, dyspnea or wheezes  Cardiovascular: Denies palpitation, chest discomfort or lower extremity swelling  Gastrointestinal: Denies nausea, heartburn or change in bowel habits  Neurological:Denies numbness, tingling or new weaknesses  Behavioral/Psych: Mood is stable, no new changes  All other systems were reviewed with the patient and are negative.  DENTAL HISTORY: CHIEF COMPLAINT: Patient was referred for a pre-chemoradiation therapy dental consultation.  HPI: Christopher Mullins is a 58 year old  male recently diagnosed with squamous cell carcinoma of the right tonsil. Patient with anticipated chemoradiation therapy. Patient is now seen as part of a medically necessary pre-chemoradiation therapy dental protocol examination.  The patient currently denies acute toothache, swellings, or abscesses. Patient was last seen for an exam and cleaning approximately September of 2014. Patient is followed by Dr. Nicki Reaper Minor as his primary dentist. The patient had an implant placed in the area of tooth #30 in 2013 by Dr. Sherren Mocha  Owsley(oral surgeon). This currently has a healing abutment and no definitive crown restoration.   DENTAL EXAMINATION: GENERAL: Patient is a well-developed, well-nourished male in no acute distress. HEAD AND NECK: There is right neck lymphadenopathy that is palpated and noted on CT scan. There is no left neck lymphadenopathy palpated at this time. Patient denies acute TMJ symptoms. INTRAORAL EXAM: Patient has normal saliva. I do NOT see any evidence of oral abscess formation. The patient has a mandibular right torus. The patient has a deep palatal vault.  DENTITION: The patient is missing tooth numbers 1, 16, 17, 30 and 32. There is an implant in the area of tooth #30 that is not currently restored. The patient has multiple malpositioned teeth and maxillary and mandibular anterior crowding. PERIODONTAL: Patient has chronic periodontitis with plaque and calculus accumulations, selective areas of gingival recession and no significant tooth mobility. Incipient bone loss is noted. DENTAL CARIES/SUBOPTIMAL RESTORATIONS: There may be recurrent caries associated with the distal of tooth #13, lingaul caries on #14, and distal facial caries #21.  I will refer back to his primary dentist for evaluation for restorative treatment at this time.  ENDODONTIC: Patient currently denies acute pulpitis symptoms. Patient has a history of occasional sensitivity in the lower left molar area. There may be some periapical radiolucency associated with tooth #19. I will refer to an endodontist for evaluation and treatment as indicated. Patient does have a root canal therapy associated with tooth #14 that was treated by Dr. Tarri Glenn with no persistent symptoms. CROWN AND BRIDGE: There are multiple crown restorations noted on tooth numbers 2, 3, 5, 14, 15, 18, 8, 20, 29, and 31. IMPLANT: There is an endosteal implant in the area numbers 30 that has a healing cap on it. This has NOT been restored with a definitive restoration due to  economic reasons. PROSTHODONTIC: Patient denies having any partial dentures. OCCLUSION:  The patient has a poor occlusal scheme secondary to multiple missing teeth, multiple malpositioned teeth, anterior crowding, and slight anterior open bite.   RADIOGRAPHIC INTERPRETATION: An orthopantogram was taken and this was supplemented with a full series of dental radiographs. The patient is missing tooth numbers 1, 16, 17, 30, and 32. There is an implant in the area of tooth #30. There is incipient bone loss noted. Patient has a root canal therapy associated with tooth #14. Patient has multiple resin, amalgam, and crown or bridge restorations. There appears to be recurrent caries associated with the distal of #13. There may be incipient periapical radiolucency and pathology associated with the roots of tooth #19.   ASSESSMENTS: 1. Squamous cell carcinoma of the right tonsil 2. Pre-chemoradiation therapy dental protocol  3. Chronic periodontitis with bone loss 4. Accretions 5. Gingival recession 6. Missing tooth numbers 1, 16, 17, 30, and 32.  6. Endosteal implant an area of tooth #30. 7. Possible recurrent caries associated with the distal of #13 along with caries of tooth numbers 14 on the lingual, and distal facial of #21 8. Multiple malpositioned teeth. 9.  Malocclusion 10. Possible periapical radiolucency in pathology associated with the roots of tooth #19-endodontic referral was recommended 11. Mandibualr right lingual torus   PLAN/RECOMMENDATIONS: 1. I discussed the risks, benefits, and complications of various treatment options with the patient in relationship to his medical and dental conditions, anticipated chemoradiation therapy, chemoradiation therapy side effects to include xerostomia, radiation caries, trismus, mucositis, taste changes, gum and jawbone changes, and risk for infection and osteoradionecrosis. We discussed various treatment options to include no treatment, multiple  extraction of teeth in the primary field of radiation therapy, alveoloplasty as needed, pre-prosthetic surgery as indicated, periodontal therapy, dental restorations, root canal therapy, crown and bridge therapy, implant therapy, and replacement of missing teeth as indicated. We also discussed fabrication of fluoride trays and scatter protection devices. The patient currently wishes to proceed with second opinion evaluation with Dr. Frederik Schmidt (oral surgeon) concerning extraction of tooth numbers 2 and 31 with alveoloplasty as needed. The patient also agrees with referral to an endodontist for evaluation for root canal therapy of tooth #19. The patient also agrees to followup with his primary dentist, Dr. Nicki Reaper Minor, for evaluation for restoration of tooth numbers 13, 14, and 21 along with periodontal therapy. He also agrees to impressions today for the fabrication of fluoride trays and scatter protection devices.  A prescription for FLUORISHIELD was sent to [facility] long outpatient pharmacy at the patient's request.   2. Discussion of findings with medical team and coordination of future medical and dental care as needed.  I spent 75 minutes face to face with patient and more than 50% of time was spent in counseling and /or coordination of care.   Lenn Cal, DDS

## 2014-04-23 NOTE — Patient Instructions (Signed)

## 2014-04-24 ENCOUNTER — Ambulatory Visit: Payer: BC Managed Care – PPO | Admitting: Physical Therapy

## 2014-04-24 ENCOUNTER — Telehealth: Payer: Self-pay | Admitting: *Deleted

## 2014-04-24 NOTE — Telephone Encounter (Addendum)
Patient called in follow-up to his second-opinion consult at [facility] today.  He stated that overall the information was comparable to that obtained during his visits with Drs. Isidore Moos and Alvy Bimler during his 04/17/14 [facility] [place] visit with 2 notable expceptions:  1) administration of Cisplatin daily at 20 mg/d over 5 days, per a clinical trial currently underway, and 2) feeding tubes are optional.  Patient indicated [place] has 2 overriding strengths: 1) availability of Tomotherapy, and 2) care coordination.  He also noted that he likes the proactive stance on the feeding tube.  While confident he will pursue his tmts at [facility], he wanted to discuss his dx and proposed tmts with his children during a visit over the weekend.  He indicated he will call me next week to confirm his decision.   Gayleen Orem, RN, BSN, [facility] Head & Neck Oncology Navigator [phone]

## 2014-04-24 NOTE — Telephone Encounter (Signed)
Patient's dtr called with questions re: her dad's treatments, SEs, prognosis.  Per conversation with patient on Monday of this week during which he gave me permission to share information with his children, I answered her questions.  She expressed appreciation for the information; stated she felt she had a better understanding of what lies ahead for her dad and how she can support him.  Gayleen Orem, RN, BSN, [facility] Head & Neck Oncology Navigator [phone]

## 2014-04-24 NOTE — Progress Notes (Signed)
Patient called to offer update after visit with his children over the weekend during which he told them about his dx, upcoming procedures,  treatments and probable SEs.  He indicated that I could expect a call from his dtr Merleen Nicely and that it was OK to answer her questions.  He confirmed that he will pursue his tmts at [facility].    Gayleen Orem, RN, BSN, [facility] Head & Neck Oncology Navigator [phone]

## 2014-04-26 ENCOUNTER — Telehealth: Payer: Self-pay | Admitting: Hematology and Oncology

## 2014-04-26 NOTE — Telephone Encounter (Signed)
per staff message from NG moved all appts from 6/22 and 6/23 to 6/29. lmonvm informing pt and confirming next appt for 6/16. pt to get new schedule when he comes in 6/16.

## 2014-04-29 ENCOUNTER — Telehealth: Payer: Self-pay | Admitting: Hematology and Oncology

## 2014-04-29 ENCOUNTER — Telehealth: Payer: Self-pay | Admitting: *Deleted

## 2014-04-29 NOTE — Telephone Encounter (Signed)
Pt left VM this morning says he missed call from Scheduling.  Called back and s/w wife.  Reviewed pt's schedule and she confirmed his appts.  No questions.

## 2014-04-29 NOTE — Telephone Encounter (Signed)
Patient called to report that he is unable to keep his Chemo Ed appt tomorrow as it conflicts with schedule extractions with Dr.Owsley.  I told him I would facilitate rescheduling.  Notified Hassan Rowan who arranged rescheduled appt for 05/07/14.  Continuing navigation as L1 patient (new patient).  Gayleen Orem, RN, BSN, [facility] Head & Neck Oncology Navigator [phone]

## 2014-04-29 NOTE — Telephone Encounter (Signed)
Per message from MD, I have moved appt for treatment from 6/29 to 7/1. Left patient message to call the office for new appts

## 2014-04-29 NOTE — Telephone Encounter (Signed)
returned pt call and r/s appt per pt request dut to pt having tooth extraction...done...pt aware of new d.t

## 2014-04-29 NOTE — Telephone Encounter (Signed)
Documentation under phone call.

## 2014-04-30 ENCOUNTER — Other Ambulatory Visit: Payer: BC Managed Care – PPO

## 2014-05-01 ENCOUNTER — Other Ambulatory Visit: Payer: Self-pay | Admitting: Radiation Oncology

## 2014-05-01 DIAGNOSIS — C099 Malignant neoplasm of tonsil, unspecified: Secondary | ICD-10-CM

## 2014-05-02 ENCOUNTER — Telehealth: Payer: Self-pay | Admitting: *Deleted

## 2014-05-02 NOTE — Telephone Encounter (Signed)
CALLED PATIENT TO INFORM OF LAB ON 05-06-14 @ 12:45 PM, SPOKE WITH HIS WIFE  ANNETTE AND SHE IS AWARE OF THIS APPT.

## 2014-05-06 ENCOUNTER — Other Ambulatory Visit: Payer: Self-pay | Admitting: Hematology and Oncology

## 2014-05-06 ENCOUNTER — Ambulatory Visit: Payer: BC Managed Care – PPO | Admitting: Hematology and Oncology

## 2014-05-06 ENCOUNTER — Encounter: Payer: Self-pay | Admitting: *Deleted

## 2014-05-06 ENCOUNTER — Ambulatory Visit
Admission: RE | Admit: 2014-05-06 | Discharge: 2014-05-06 | Disposition: A | Discharge status: Home / Self Care | Payer: BC Managed Care – PPO | Source: Ambulatory Visit | Attending: Radiation Oncology | Admitting: Radiation Oncology

## 2014-05-06 ENCOUNTER — Encounter: Payer: Self-pay | Admitting: Radiation Oncology

## 2014-05-06 ENCOUNTER — Telehealth (INDEPENDENT_AMBULATORY_CARE_PROVIDER_SITE_OTHER): Payer: Self-pay

## 2014-05-06 ENCOUNTER — Encounter (HOSPITAL_COMMUNITY): Payer: Self-pay | Admitting: Dentistry

## 2014-05-06 ENCOUNTER — Other Ambulatory Visit: Payer: BC Managed Care – PPO

## 2014-05-06 ENCOUNTER — Ambulatory Visit (HOSPITAL_COMMUNITY): Payer: Medicaid - Dental | Admitting: Dentistry

## 2014-05-06 VITALS — BP 122/80 | HR 49 | Temp 97.5°F | Ht 68.0 in

## 2014-05-06 VITALS — BP 118/76 | HR 53 | Temp 97.8°F

## 2014-05-06 DIAGNOSIS — Z931 Gastrostomy status: Secondary | ICD-10-CM | POA: Insufficient documentation

## 2014-05-06 DIAGNOSIS — K08409 Partial loss of teeth, unspecified cause, unspecified class: Secondary | ICD-10-CM

## 2014-05-06 DIAGNOSIS — B1920 Unspecified viral hepatitis C without hepatic coma: Secondary | ICD-10-CM | POA: Insufficient documentation

## 2014-05-06 DIAGNOSIS — Z0189 Encounter for other specified special examinations: Secondary | ICD-10-CM

## 2014-05-06 DIAGNOSIS — B370 Candidal stomatitis: Secondary | ICD-10-CM | POA: Diagnosis not present

## 2014-05-06 DIAGNOSIS — C099 Malignant neoplasm of tonsil, unspecified: Secondary | ICD-10-CM

## 2014-05-06 DIAGNOSIS — R634 Abnormal weight loss: Secondary | ICD-10-CM | POA: Insufficient documentation

## 2014-05-06 LAB — BUN AND CREATININE (CC13)
BUN: 16.9 mg/dL (ref 7.0–26.0)
CREATININE: 0.9 mg/dL (ref 0.7–1.3)

## 2014-05-06 MED ORDER — SODIUM CHLORIDE 0.9 % IJ SOLN
10.0000 mL | Freq: Once | INTRAMUSCULAR | Status: AC
  Administered 2014-05-06: 10 mL via INTRAVENOUS

## 2014-05-06 NOTE — Progress Notes (Signed)
IV start documented in on Doc flow sheet.  Tolerated without complaint.  Patric Dykes with unsuccessful attempt x 2 in the left antecubital region and the right hand.  Successful attempt by Gaspar Garbe, RN in thee right antecubital region at 1500.  Escorted to simulation with his spouse.

## 2014-05-06 NOTE — Addendum Note (Signed)
Encounter addended by: Deirdre Evener, RN on: 05/06/2014  4:22 PM<BR>     Documentation filed: Notes Section

## 2014-05-06 NOTE — Progress Notes (Signed)
05/06/2014  Patient:            Christopher Mullins Date of Birth:  [birth year] MRN:                [id number]  BP 118/76  Pulse 53  Temp(Src) 97.8 F (36.6 C) (Oral)  [name] now presents for insertion of upper and lower fluoride trays and scatter protection devices. The patient had extraction of tooth numbers 2 and 31 with Dr. Benson Norway on 04/30/2014. The patient also had root canal therapy of tooth #19 and 05/02/2014 with Dr. Sue Lush. Patient had a dental cleaning on 04/25/2014 with Dr. Belva Agee. Patient scheduled to have a dental restoration of tooth #13 with Dr. Minor in the near future.  SUBJECTIVE: Patient with minimal discomfort from the root canal therapy and dental extractions.  OBJECTIVE: There is no sign of infection, heme, or ooze coming from dental extraction sites of tooth #2 and 31. Sutures are intact. Healing appears to be progressing well. Sutures are to dissolve on their own.  ASSESSMENT: Postop course is consistent with dental extractions by Dr. Benson Norway and root canal therapy by Dr. Sue Lush.  PROCEDURE: Appliances were tried in and adjusted as needed. [place] Trismus device was previously fabricated 45 mm using 27 sticks. Postop instructions were provided and a written and verbal format concerning the use and care of appliances. All questions were answered.  Plan: 1. Patient to use salt water rinses as needed to aid healing. 2. Patient is cleared to start chemoradiation therapy on 05/15/2014 barring any unanticipated complication. 3. Patient to brush after meals and at bedtime. Patient to use fluoride at bedtime as instructed. 4. Patient to perform trismus exercises daily as instructed. 5. Patient to return to clinic for periodic oral examination in approximately 2-3 weeks during radiation therapy. 6. Patient to call if questions or problems arise before then.  Lenn Cal, DDS

## 2014-05-06 NOTE — Addendum Note (Signed)
Encounter addended by: Deirdre Evener, RN on: 05/06/2014  3:34 PM<BR>     Documentation filed: Inpatient MAR

## 2014-05-06 NOTE — Addendum Note (Signed)
Encounter addended by: Deirdre Evener, RN on: 05/06/2014  3:30 PM<BR>     Documentation filed: Visit Diagnoses, Orders

## 2014-05-06 NOTE — Addendum Note (Signed)
Encounter addended by: Rebecca Eaton, RN on: 05/06/2014  4:05 PM<BR>     Documentation filed: Notes Section

## 2014-05-06 NOTE — Progress Notes (Addendum)
Patient IV Right Antecubital removed, catheter  tip intact,  2x2 gause placed over site and taped, and held pressure 2 minutes arm held up, gave instructions to leave on for a few hours, then can place a bandiad over site for 1 day,patient gave verbal understanding ,d/c home ambulatory steady gait.  BUN and Creat levels were 16.9 and 0.9 respectively and is is not a diabetic. 4:05 PM

## 2014-05-06 NOTE — Patient Instructions (Signed)
Plan: 1. Patient to use salt water rinses as needed to aid healing. 2. Patient is cleared to start chemoradiation therapy on 05/15/2014 barring any unanticipated complication. 3. Patient to brush after meals and at bedtime. Patient to use fluoride at bedtime as instructed. 4. Patient to perform trismus exercises daily as instructed. 5. Patient to return to clinic for periodic oral examination in approximately 2-3 weeks during radiation therapy. 6. Patient to call if questions or problems arise before then.  Lenn Cal, DDS   FLUORIDE TRAYS PATIENT INSTRUCTIONS    Obtain prescription from the pharmacy.  Don't be surprised if it needs to be ordered.  Be sure to let the pharmacy know when you are close to needing a new refill for them to have it ready for you without interruption of Fluoride use.  The best time to use your Fluoride is before bed time.  You must brush your teeth very well and floss before using the Fluoride in order to get the best use out of the Fluoride treatments.  Place 1 drop of Fluoride gel per tooth in the tray.  Place the tray on your lower teeth and your upper teeth.  Make sure the trays are seated all the way.  Remember, they only fit one way on your teeth.  Insert for 5 full minutes.  At the end of the 5 minutes, take the trays out.  SPIT OUT excess. .  Do NOT rinse your mouth!  Do NOT eat or drink after treatments for at least 30 minutes.  This is why the best time for your treatments is before bedtime.  Clean the inside of your Fluoride trays using COLD WATER and a toothbrush.  In order to keep your Trays from discoloring and free from odors, soak them overnight in denture cleaners such as Efferdent.  Do not use bleach or non denture products.  Store the trays in a safe dry place AWAY from any heat until your next treatment.  If anything happens to your Fluoride trays, or they don't fit as well after any dental work, please let us know as  soon as possible.

## 2014-05-06 NOTE — Progress Notes (Signed)
Simulation, IMRT treatment planning, and Special treatment procedure note   outpatient  Diagnosis: head and neck cancer   ICD-9-CM  1. 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 An Aquaplast head and shoulder mask was custom fitted to the patient's anatomy. High-resolution CT axial imaging was obtained of the head and neck with contrast. I verified that the quality of the imaging is good for treatment planning. 1 Medically Necessary Treatment Device was fabricated and supervised by me: Aquaplast mask.   Treatment planning note I plan to treat the patient with helical Tomotherapy, IMRT. I plan to treat the patient's tumor and bilateral neck nodes. I plan to treat to a total dose of 70 Gray in 35  fractions   IMRT planning Note  IMRT is an important modality to deliver adequate dose to the patient's at risk tissues while sparing the patient's normal structures, including the: esophagus, parotid tissue, mandible, brain stem, spinal cord, oral cavity, brachial plexus.  This justifies the use of IMRT in the patient's treatment.   Special Treatment Procedure Note:  The patient will be receiving chemotherapy concurrently. Chemotherapy heightens the risk of side effects. I have considered this during the patient's treatment planning process and will monitor the patient accordingly for side effects on a weekly basis. Concurrent chemotherapy increases the complexity of this patient's treatment and therefore this constitutes a special treatment procedure.  NOTE: I spoke with Dr. Constance Holster, as Dr Erik Obey is out of town this week, about scoping /biopsying the left nasopharynx due to subtle uptake and asymmetry on his PET. I will devise two plans, so both are ready depending on whether the biopsy is positive or not.  -----------------------------------  Eppie Gibson, MD

## 2014-05-06 NOTE — Telephone Encounter (Signed)
Called and spoke to patient regarding appointment tomorrow with Dr. Hassell Done for evaluation of [facility] & Feeding Tube Placement.  Patient to start treatment on 05/15/14 per Dr. Alvy Bimler.  Reviewed with Dr. Hassell Done and advised to have patient be sen in office tomorrow.

## 2014-05-07 ENCOUNTER — Other Ambulatory Visit: Payer: BC Managed Care – PPO

## 2014-05-07 ENCOUNTER — Ambulatory Visit: Payer: BC Managed Care – PPO

## 2014-05-07 ENCOUNTER — Encounter: Payer: Self-pay | Admitting: *Deleted

## 2014-05-07 ENCOUNTER — Encounter: Payer: BC Managed Care – PPO | Admitting: Nutrition

## 2014-05-07 ENCOUNTER — Other Ambulatory Visit: Payer: Self-pay | Admitting: Otolaryngology

## 2014-05-07 ENCOUNTER — Ambulatory Visit (INDEPENDENT_AMBULATORY_CARE_PROVIDER_SITE_OTHER): Payer: BC Managed Care – PPO | Admitting: Surgery

## 2014-05-07 ENCOUNTER — Encounter (INDEPENDENT_AMBULATORY_CARE_PROVIDER_SITE_OTHER): Payer: Self-pay | Admitting: Surgery

## 2014-05-07 VITALS — BP 130/90 | HR 61 | Temp 98.0°F | Resp 14 | Ht 68.0 in | Wt 193.2 lb

## 2014-05-07 DIAGNOSIS — C099 Malignant neoplasm of tonsil, unspecified: Secondary | ICD-10-CM

## 2014-05-07 HISTORY — PX: PHARYNX BIOPSY: SHX1029

## 2014-05-07 NOTE — Patient Instructions (Signed)
Gastrostomy Tube Home Guide A gastrostomy tube is a tube that is surgically placed into the stomach. It is also called a "G-tube." G-tubes are used when a person is unable to eat and drink enough on their own to stay healthy. The tube is inserted into the stomach through a small cut (incision)in the skin. This tube is used for:  Feeding.  Giving medication. GASTROSTOMY TUBE CARE  Wash your hands with soap and water.  Remove the old dressing (if any). Some styles of G-tubes may need a dressing inserted between the skin and the G-tube. Other types of G-tubes do not require a dressing. Ask your health care provider if a dressing is needed.  Check the area where the tube enters the skin (insertion site) for redness, swelling, or pus-like (purulent) drainage. A small amount of clear or tan liquid drainage is normal. Check to make sure scar tissue(skin) is not growing around the insertion site. This could have a raised, bumpy appearance.  A cotton swab can be used to clean the skin around the tube:  When the G-tube is first put in, a normal saline solution or water can be used to clean the skin.  Mild soap and warm water can be used when the skin around the G-tube site has healed.  Roll the cotton swab around the G-tube insertion site to remove any drainage or crusting at the insertion site. STOMACH RESIDUALS Feeding tube residuals are the amount of liquids that are in the stomach at any given time. Residuals may be checked before giving feedings, medications, or as instructed by your health care provider.  Ask your health care provider if there are instances when you would not start tube feedings depending on the amount or type of contents withdrawn from the stomach.  Check residuals by attaching a syringe to the G-tube and pulling back on the syringe plunger. Note the amount, and return the residual back into the stomach. FLUSHING THE G-TUBE  The G-tube should be periodically flushed with  clean warm water to keep it from clogging.  Flush the G-tube after feedings or medications. Draw up 30 mL of warm water in a syringe. Connect the syringe to the G-tube and slowly push the water into the tube.  Do not push feedings, medications, or flushes rapidly. Flush the G-tube gently and slowly.  Only use syringes made for G-tubes to flush medications or feedings.  Your health care provider may want the G-tube flushed more often or with more water. If this is the case, follow your health care provider's instructions. FEEDINGS Your health care provider will determine whether feedings are given as a bolus (a certain amount given at one time and at scheduled times) or whether feedings will be given continuously on a feeding pump.   Formulas should be given at room temperature.  If feedings are continuous, no more than 4 hours worth of feedings should be placed in the feeding bag. This helps prevent spoilage or accidental excess infusion.  Cover and place unused formula in the refrigerator.  If feedings are continuous, stop the feedings when medications or flushes are given. Be sure to restart the feedings.  Feeding bags and syringes should be replaced as instructed by your health care provider. GIVING MEDICATION   In general, it is best if all medications are in a liquid form for G-tube administration. Liquid medications are less likely to clog the G-tube.  Mix the liquid medication with 30 mL (or amount recommended by your health care  provider) of warm water.  Draw up the medication into the syringe.  Attach the syringe to the G-tube and slowly push the mixture into the G-tube.  After giving the medication, draw up 30 mL of warm water in the syringe and slowly flush the G-tube.  For pills or capsules, check with your health care provider first before crushing medications. Some pills are not effective if they are crushed. Some capsules are sustained release medications.  If  appropriate, crush the pill or capsule and mix with 30 mL of warm water. Using the syringe, slowly push the medication through the tube, then flush the tube with another 30 mL of tap water. G-TUBE PROBLEMS G-tube was pulled out.  Cause: May have been pulled out accidentally.  Solutions: Cover the opening with clean dressing and tape. Call your health care provider right away. The G-tube should be put in as soon as possible (within 4 hours) so the G-tube opening (tract) does not close. The G-tube needs to be put in at a health care setting. An X-ray needs to be done to confirm placement before the G-tube can be used again. Redness, irritation, soreness, or foul odor around the gastrostomy site.  Cause: May be caused by leakage or infection.  Solutions: Call your health care provider right away. Large amount of leakage of fluid or mucus-like liquid present (a large amount means it soaks clothing).  Cause: Many reasons could cause the G-tube to leak.  Solutions: Call your health care provider to discuss the amount of leakage. Skin or scar tissue appears to be growing where tube enters skin.   Cause: Tissue growth may develop around the insertion site if the G-tube is moved or pulled on excessively.  Solutions: Secure tube with tape so that excess movement does not occur. Call your health care provider. G-tube is clogged.  Cause: Thick formula or medication.  Solutions: Try to slowly push warm water into the tube with a large syringe. Never try to push any object into the tube to unclog it. Do not force fluid into the G-tube. If you are unable to unclog the tube, call your health care provider right away. TIPS  Head of bed (HOB) position refers to the upright position of a person's upper body.  When giving medications or a feeding bolus, keep the [facility] up as told by your health care provider. Do this during the feeding and for 1 hour after the feeding or medication administration.  If  continuous feedings are being given, it is best to keep the [facility] up as told by your health care provider. When ADLs (activities of daily living) are performed and the [facility] needs to be flat, be sure to turn the feeding pump off. Restart the feeding pump when the [facility] is returned to the recommended height.  Do not pull or put tension on the tube.  To prevent fluid backflow, kink the G-tube before removing the cap or disconnecting a syringe.  Check the G-tube length every day. Measure from the insertion site to the end of the G-tube. If the length is longer than previous measurements, the tube may be coming out. Call your health care provider if you notice increasing G-tube length.  Oral care, such as brushing teeth, must be continued.  You may need to remove excess air (vent) from the G-tube. Your health care provider will tell you if this is needed.  Always call your health care provider if you have questions or problems with the G-tube. SEEK  IMMEDIATE MEDICAL CARE IF:   You have severe abdominal pain, tenderness, or abdominal bloating (distension).  You have nausea or vomiting.  You are constipated or have problems moving your bowels.  The G-tube insertion site is red, swollen, has a foul smell, or has yellow or brown drainage.  You have difficulty breathing or shortness of breath.  You have a fever.  You have a large amount of feeding tube residuals.  The G-tube is clogged and cannot be flushed. MAKE SURE YOU:   Understand these instructions.  Will watch your condition.  Will get help right away if you are not doing well or get worse. Document Released: 01/10/2002 Document Revised: 11/06/2013 Document Reviewed: 07/09/2013 [facility] Patient Information 2015 [place], [place]. This information is not intended to replace advice given to you by your health care provider. Make sure you discuss any questions you have with your health care provider. Care of a Feeding Tube People who have  trouble swallowing or cannot take food or medicine by mouth are sometimes given feeding tubes. A feeding tube can go into the nose and down to the stomach or through the skin in the abdomen and into the stomach or small bowel. Some of the names of these feeding tubes are gastrostomy tubes, PEG lines, nasogastric tubes, and gastrojejunostomy tubes.  SUPPLIES NEEDED TO CARE FOR THE TUBE SITE  Clean gloves.  Clean wash cloth, gauze pads, or soft paper towel.  Cotton swabs.  Skin barrier ointment or cream.  Soap and water.  Pre-cut foam pads or gauze (that go around the tube).  Tube tape. TUBE SITE CARE 1. Have all supplies ready and available. 2. Wash hands well. 3. Put on clean gloves. 4. Remove the soiled foam pad or gauze, if present, that is found under the tube stabilizer. Change the foam pad or gauze daily or when soiled or moist. 5. Check the skin around the tube site for redness, rash, swelling, drainage, or extra tissue growth. If you notice any of these, call your caregiver. 6. Moisten gauze and cotton swabs with water and soap. 7. Wipe the area closest to the tube (right near the stoma) with cotton swabs. Wipe the surrounding skin with moistened gauze. Rinse with water. 8. Dry the skin and stoma site with a dry gauze pad or soft paper towel. Do not use antibiotic ointments at the tube site. 9. If the skin is red, apply a skin barrier cream or ointment (such as petroleum jelly) in a circular motion, using a cotton swab. The cream or ointment will provide a moisture barrier for the skin and helps with wound healing. 10. Apply a new pre-cut foam pad or gauze around the tube. Secure it with tape around the edges. If no drainage is present, foam pads or gauze may be left off. 11. Use tape or an anchoring device to fasten the feeding tube to the skin for comfort or as directed. Rotate where you tape the tube to avoid skin damage from the adhesive. 12. Position the person in a  semi-upright position (30-45 degree angle). 13. Throw away used supplies. 14. Remove gloves. 15. Wash hands. SUPPLIES NEEDED TO FLUSH A FEEDING TUBE  Clean gloves.  60 mL syringe (that connects to the feeding tube).  Towel.  Water. FLUSHING A FEEDING TUBE  1. Have all supplies ready and available. 2. Wash hands well. 3. Put on clean gloves. 4. Draw up 30 mL of water in the syringe. 5. Kink the feeding tube while disconnecting it  from the feeding-bag tubing or while removing the plug at the end of the tube. Kinking closes the tube and prevents secretions in the tube from spilling out. 6. Insert the tip of the syringe into the end of the feeding tube. Release the kink. Slowly inject the water. 7. If unable to inject the water, the person with the feeding tube should lay on his or her left side. The tip of the tube may be against the stomach wall, blocking fluid flow. Changing positions may move the tip away from the stomach wall. After repositioning, try injecting the water again. 8. After injecting the water, remove the syringe. 9. Always flush before giving the first medicine, between medicines, and after the final medicine before starting a feeding. This prevents medicines from clogging the tube. 10. Throw away used supplies. 11. Remove gloves. 12. Wash hands. Document Released: 11/01/2005 Document Revised: 10/18/2012 Document Reviewed: 06/15/2012 [facility] Patient Information 2015 [place], [place]. This information is not intended to replace advice given to you by your health care provider. Make sure you discuss any questions you have with your health care provider.

## 2014-05-07 NOTE — Progress Notes (Signed)
Chief Complaint:  Right tonsillar cancer; need for feeding tube and Port-A-Cath for chemotherapy  History of Present Illness:  Christopher Mullins is an 58 y.o. 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 This was found when he first initially found a mass in his right neck. He is scheduled to begin chemo radiation therapy on July 1.  Because he has had a prior open splenectomy for trauma he will need to have this performed an open or laparoscopic fashion.  At the time of his splenectomy for trauma he received multiple units of blood and from that and contracted hepatitis C. He is not recorded treatment for this as yet.  Past Medical History  Diagnosis Date  . 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 Cancer   . Hepatitis C   . Psoriasis     Past Surgical History  Procedure Laterality Date  . 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 Colonscopy  2010  . Tonsil biopsy    . Liver biopsy      Current Outpatient Prescriptions  Medication Sig Dispense Refill  . amoxicillin (AMOXIL) 500 MG capsule       . HYDROcodone-acetaminophen (NORCO) 10-325 MG per tablet       . sodium fluoride (FLUORISHIELD) 1.1 % GEL dental gel Instill one drop of fluoride per tooth space of fluoride tray. Place over teeth for 5 minutes. Remove. Spit out excess. Repeat nightly.  120 mL  prn   No current facility-administered medications for this visit.   Sulfa antibiotics Family History  Problem Relation Age of Onset  . Cancer Neg Hx    Social History:   reports that he quit smoking about 2 years ago. His smoking use included Cigarettes. He has a 30 pack-year smoking history. He has never used smokeless tobacco. He reports that he drinks alcohol. He reports that he does not use illicit drugs.   REVIEW OF SYSTEMS : Positive for blood transfusions and hepatitis C, prior exploratory laparotomy ; otherwise negative  Physical Exam:   Blood pressure 130/90, pulse 61,  temperature 98 F (36.7 C), resp. rate 14, height 5\' 8"  (1.727 m), weight 193 lb 3.2 oz (87.635 kg). Body mass index is 29.38 kg/(m^2).  Gen:  WDWN white male NAD  Neurological: Alert and oriented to person, place, and time. Motor and sensory function is grossly intact  Head: Normocephalic and atraumatic.  Eyes: Conjunctivae are normal. Pupils are equal, round, and reactive to light. No scleral icterus.  Neck: Normal range of motion. There is a palpable firmness in the right jaw area. No tracheal deviation or thyromegaly present.  Cardiovascular:  SR without murmurs or gallops.  No carotid bruits Breast:  Not examined Respiratory: Effort normal.  No respiratory distress. No chest wall tenderness. Breath sounds normal.  No wheezes, rales or rhonchi.  Abdomen:  Midline incision from splenectomy. GU:  Not examined Musculoskeletal: Normal range of motion. Extremities are nontender. No cyanosis, edema or clubbing noted Lymphadenopathy: No cervical, preauricular, postauricular or axillary adenopathy is present Skin: Skin is warm and dry. No rash noted. No diaphoresis. No erythema. No pallor. Pscyh: Normal mood and affect. Behavior is normal. Judgment and thought content normal.   LABORATORY RESULTS: Results for orders placed during the hospital encounter of 05/06/14 (from the past 48 hour(s))  BUN AND CREATININE (CC13)     Status: None   Collection Time    05/06/14 12:58 PM  Result Value Ref Range   BUN 16.9  7.0 - 26.0 mg/dL   Creatinine 0.9  0.7 - 1.3 mg/dL     RADIOLOGY RESULTS: No results found.  Problem List: Patient Active Problem List   Diagnosis Date Noted  . Tonsil cancer 04/17/2014  . Hepatitis C 04/17/2014  . Psoriasis 04/17/2014    Assessment & Plan: Tonsillar cancer on the right side with prior laparotomy precluding PEG.  Plan left subclavian portacath and placement of lap/open G tube    Matt B. Hassell Done, MD, [facility] [phone]  beeper [phone]  05/07/2014 2:59 PM

## 2014-05-08 ENCOUNTER — Encounter (HOSPITAL_COMMUNITY): Payer: Self-pay | Admitting: Pharmacy Technician

## 2014-05-08 NOTE — Patient Instructions (Addendum)
[place]  05/09/2014   Your procedure is scheduled on: 05/10/14  Report to [facility] at 05:30 AM.  Call this number if you have problems the morning of surgery [phone]: [phone]   Remember:   Do not eat food or drink liquids After Midnight.     Take these medicines the morning of surgery with A SIP OF WATER: amoxicillin   Do not wear jewelry, make-up or nail polish.  Do not wear lotions, powders, or perfumes. You may wear deodorant.  Do not shave 48 hours prior to surgery. Men may shave face and neck.  Do not bring valuables to the hospital.  Contacts, dentures or bridgework may not be worn into surgery.     Patients discharged the day of surgery will not be allowed to drive home.  Name and phone number of your driver:Anne Ng [phone]    Paulette Blanch, RN  pre op nurse call if needed [phone]    [facility] - Preparing for Surgery Before surgery, you can play an important role.  Because skin is not sterile, your skin needs to be as free of germs as possible.  You can reduce the number of germs on your skin by washing with CHG (chlorahexidine gluconate) soap before surgery.  CHG is an antiseptic cleaner which kills germs and bonds with the skin to continue killing germs even after washing. Please DO NOT use if you have an allergy to CHG or antibacterial soaps.  If your skin becomes reddened/irritated stop using the CHG and inform your nurse when you arrive at Short Stay. Do not shave (including legs and underarms) for at least 48 hours prior to the first CHG shower.  You may shave your face/neck. Please follow these instructions carefully:  1.  Shower with CHG Soap the night before surgery and the  morning of Surgery.  2.  If you choose to wash your hair, wash your hair first as usual with your  normal  shampoo.  3.  After you shampoo, rinse your hair and body thoroughly to remove the  shampoo.                            4.  Use CHG as you would any  other liquid soap.  You can apply chg directly  to the skin and wash                       Gently with a scrungie or clean washcloth.  5.  Apply the CHG Soap to your body ONLY FROM THE NECK DOWN.   Do not use on face/ open                           Wound or open sores. Avoid contact with eyes, ears mouth and genitals (private parts).                       Wash face,  Genitals (private parts) with your normal soap.             6.  Wash thoroughly, paying special attention to the area where your surgery  will be performed.  7.  Thoroughly rinse your body with warm water from the neck down.  8.  DO NOT shower/wash with your normal soap after using and rinsing off  the CHG Soap.  9.  Pat yourself dry with a clean towel.            10.  Wear clean pajamas.            11.  Place clean sheets on your bed the night of your first shower and do not  sleep with pets. Day of Surgery : Do not apply any lotions/deodorants the morning of surgery.  Please wear clean clothes to the hospital/surgery center.  FAILURE TO FOLLOW THESE INSTRUCTIONS MAY RESULT IN THE CANCELLATION OF YOUR SURGERY PATIENT SIGNATURE_________________________________  NURSE SIGNATURE__________________________________  ________________________________________________________________________

## 2014-05-09 ENCOUNTER — Encounter (HOSPITAL_COMMUNITY): Payer: Self-pay

## 2014-05-09 ENCOUNTER — Encounter (HOSPITAL_COMMUNITY)
Admission: RE | Admit: 2014-05-09 | Discharge: 2014-05-09 | Disposition: A | Discharge status: Home / Self Care | Payer: BC Managed Care – PPO | Source: Ambulatory Visit | Attending: Surgery | Admitting: Surgery

## 2014-05-09 LAB — COMPREHENSIVE METABOLIC PANEL
ALT: 180 U/L — ABNORMAL HIGH (ref 0–53)
AST: 108 U/L — ABNORMAL HIGH (ref 0–37)
Albumin: 3.6 g/dL (ref 3.5–5.2)
Alkaline Phosphatase: 43 U/L (ref 39–117)
BUN: 17 mg/dL (ref 6–23)
CALCIUM: 9.2 mg/dL (ref 8.4–10.5)
CO2: 23 mEq/L (ref 19–32)
CREATININE: 0.86 mg/dL (ref 0.50–1.35)
Chloride: 104 mEq/L (ref 96–112)
GFR calc non Af Amer: >90 mL/min (ref >90)
GLUCOSE: 102 mg/dL — AB (ref 70–99)
Potassium: 4.4 mEq/L (ref 3.7–5.3)
Sodium: 140 mEq/L (ref 137–147)
TOTAL PROTEIN: 7.3 g/dL (ref 6.0–8.3)
Total Bilirubin: 0.4 mg/dL (ref 0.3–1.2)

## 2014-05-09 LAB — CBC
HEMATOCRIT: 40.8 % (ref 39.0–52.0)
Hemoglobin: 14.2 g/dL (ref 13.0–17.0)
MCH: 31.3 pg (ref 26.0–34.0)
MCHC: 34.8 g/dL (ref 30.0–36.0)
MCV: 90.1 fL (ref 78.0–100.0)
Platelets: 191 10*3/uL (ref 150–400)
RBC: 4.53 MIL/uL (ref 4.22–5.81)
RDW: 12.7 % (ref 11.5–15.5)
WBC: 3.8 10*3/uL — ABNORMAL LOW (ref 4.0–10.5)

## 2014-05-09 NOTE — Progress Notes (Signed)
Please order an antibiotic per SCIP protocol.  Thank you

## 2014-05-09 NOTE — Progress Notes (Signed)
PET scan 04/15/14 on EPIC

## 2014-05-10 ENCOUNTER — Ambulatory Visit (HOSPITAL_COMMUNITY): Payer: BC Managed Care – PPO

## 2014-05-10 ENCOUNTER — Encounter (HOSPITAL_COMMUNITY): Payer: BC Managed Care – PPO | Admitting: Certified Registered Nurse Anesthetist

## 2014-05-10 ENCOUNTER — Ambulatory Visit (HOSPITAL_COMMUNITY)
Admission: RE | Admit: 2014-05-10 | Discharge: 2014-05-11 | Disposition: A | Discharge status: Home / Self Care | Payer: BC Managed Care – PPO | Source: Ambulatory Visit | Attending: Surgery | Admitting: Surgery

## 2014-05-10 ENCOUNTER — Encounter (HOSPITAL_COMMUNITY): Payer: Self-pay | Admitting: *Deleted

## 2014-05-10 ENCOUNTER — Encounter (HOSPITAL_COMMUNITY)
Admission: RE | Disposition: A | Discharge status: Home / Self Care | Payer: Self-pay | Source: Ambulatory Visit | Attending: Surgery

## 2014-05-10 ENCOUNTER — Ambulatory Visit (HOSPITAL_COMMUNITY): Payer: BC Managed Care – PPO | Admitting: Certified Registered Nurse Anesthetist

## 2014-05-10 DIAGNOSIS — C099 Malignant neoplasm of tonsil, unspecified: Secondary | ICD-10-CM | POA: Insufficient documentation

## 2014-05-10 DIAGNOSIS — Z87891 Personal history of nicotine dependence: Secondary | ICD-10-CM | POA: Insufficient documentation

## 2014-05-10 DIAGNOSIS — K660 Peritoneal adhesions (postprocedural) (postinfection): Secondary | ICD-10-CM | POA: Insufficient documentation

## 2014-05-10 DIAGNOSIS — L408 Other psoriasis: Secondary | ICD-10-CM | POA: Insufficient documentation

## 2014-05-10 DIAGNOSIS — Z9089 Acquired absence of other organs: Secondary | ICD-10-CM | POA: Insufficient documentation

## 2014-05-10 DIAGNOSIS — Z01812 Encounter for preprocedural laboratory examination: Secondary | ICD-10-CM | POA: Insufficient documentation

## 2014-05-10 DIAGNOSIS — B1920 Unspecified viral hepatitis C without hepatic coma: Secondary | ICD-10-CM | POA: Insufficient documentation

## 2014-05-10 HISTORY — PX: LAPAROSCOPIC GASTROSTOMY: SHX5896

## 2014-05-10 HISTORY — PX: LAPAROSCOPIC LYSIS OF ADHESIONS: SHX5905

## 2014-05-10 HISTORY — PX: PORTACATH PLACEMENT: SHX2246

## 2014-05-10 LAB — CBC
HCT: 39.8 % (ref 39.0–52.0)
Hemoglobin: 13.9 g/dL (ref 13.0–17.0)
MCH: 31.2 pg (ref 26.0–34.0)
MCHC: 34.9 g/dL (ref 30.0–36.0)
MCV: 89.4 fL (ref 78.0–100.0)
Platelets: 194 10*3/uL (ref 150–400)
RBC: 4.45 MIL/uL (ref 4.22–5.81)
RDW: 12.6 % (ref 11.5–15.5)
WBC: 8.4 10*3/uL (ref 4.0–10.5)

## 2014-05-10 LAB — CREATININE, SERUM
Creatinine, Ser: 0.95 mg/dL (ref 0.50–1.35)
GFR calc Af Amer: >90 mL/min (ref >90)
GFR calc non Af Amer: >90 mL/min (ref >90)

## 2014-05-10 SURGERY — CREATION, GASTROSTOMY, LAPAROSCOPIC
Anesthesia: General | Site: Chest

## 2014-05-10 MED ORDER — HYDROCODONE-ACETAMINOPHEN 5-325 MG PO TABS
1.0000 | ORAL_TABLET | ORAL | Status: DC | PRN
  Administered 2014-05-10 – 2014-05-11 (×4): 1 via ORAL
  Filled 2014-05-10 (×4): qty 1

## 2014-05-10 MED ORDER — DEXAMETHASONE SODIUM PHOSPHATE 10 MG/ML IJ SOLN
INTRAMUSCULAR | Status: AC
  Filled 2014-05-10: qty 1

## 2014-05-10 MED ORDER — MORPHINE SULFATE 2 MG/ML IJ SOLN: 1.0000 mg | INTRAMUSCULAR | Status: DC | PRN

## 2014-05-10 MED ORDER — HEPARIN SODIUM (PORCINE) 5000 UNIT/ML IJ SOLN
5000.0000 [IU] | Freq: Once | INTRAMUSCULAR | Status: AC
  Administered 2014-05-10: 5000 [IU] via SUBCUTANEOUS
  Filled 2014-05-10: qty 1

## 2014-05-10 MED ORDER — ONDANSETRON HCL 4 MG/2ML IJ SOLN: 4.0000 mg | Freq: Four times a day (QID) | INTRAMUSCULAR | Status: DC | PRN

## 2014-05-10 MED ORDER — HYDROMORPHONE HCL PF 2 MG/ML IJ SOLN
INTRAMUSCULAR | Status: AC
  Filled 2014-05-10: qty 1

## 2014-05-10 MED ORDER — EPHEDRINE SULFATE 50 MG/ML IJ SOLN
INTRAMUSCULAR | Status: DC | PRN
  Administered 2014-05-10 (×3): 10 mg via INTRAVENOUS

## 2014-05-10 MED ORDER — SUCCINYLCHOLINE CHLORIDE 20 MG/ML IJ SOLN
INTRAMUSCULAR | Status: DC | PRN
  Administered 2014-05-10: 100 mg via INTRAVENOUS

## 2014-05-10 MED ORDER — ONDANSETRON HCL 4 MG/2ML IJ SOLN
INTRAMUSCULAR | Status: AC
  Filled 2014-05-10: qty 2

## 2014-05-10 MED ORDER — FENTANYL CITRATE 0.05 MG/ML IJ SOLN
INTRAMUSCULAR | Status: AC
  Filled 2014-05-10: qty 5

## 2014-05-10 MED ORDER — CEFAZOLIN SODIUM-DEXTROSE 2-3 GM-% IV SOLR
INTRAVENOUS | Status: DC | PRN
  Administered 2014-05-10: 2 g via INTRAVENOUS

## 2014-05-10 MED ORDER — BUPIVACAINE LIPOSOME 1.3 % IJ SUSP
20.0000 mL | Freq: Once | INTRAMUSCULAR | Status: DC
  Filled 2014-05-10: qty 20

## 2014-05-10 MED ORDER — LIDOCAINE HCL (CARDIAC) 20 MG/ML IV SOLN
INTRAVENOUS | Status: AC
  Filled 2014-05-10: qty 5

## 2014-05-10 MED ORDER — KCL IN DEXTROSE-NACL 20-5-0.45 MEQ/L-%-% IV SOLN
INTRAVENOUS | Status: AC
  Filled 2014-05-10: qty 1000

## 2014-05-10 MED ORDER — AMOXICILLIN 500 MG PO CAPS
500.0000 mg | ORAL_CAPSULE | Freq: Three times a day (TID) | ORAL | Status: DC
  Administered 2014-05-10 – 2014-05-11 (×3): 500 mg via ORAL
  Filled 2014-05-10 (×5): qty 1

## 2014-05-10 MED ORDER — ROCURONIUM BROMIDE 100 MG/10ML IV SOLN
INTRAVENOUS | Status: AC
  Filled 2014-05-10: qty 1

## 2014-05-10 MED ORDER — GLYCOPYRROLATE 0.2 MG/ML IJ SOLN
INTRAMUSCULAR | Status: AC
  Filled 2014-05-10: qty 3

## 2014-05-10 MED ORDER — LIDOCAINE HCL (CARDIAC) 20 MG/ML IV SOLN
INTRAVENOUS | Status: DC | PRN
  Administered 2014-05-10: 100 mg via INTRAVENOUS

## 2014-05-10 MED ORDER — EPHEDRINE SULFATE 50 MG/ML IJ SOLN
INTRAMUSCULAR | Status: AC
  Filled 2014-05-10: qty 1

## 2014-05-10 MED ORDER — NEOSTIGMINE METHYLSULFATE 10 MG/10ML IV SOLN
INTRAVENOUS | Status: AC
  Filled 2014-05-10: qty 1

## 2014-05-10 MED ORDER — ROCURONIUM BROMIDE 100 MG/10ML IV SOLN
INTRAVENOUS | Status: DC | PRN
  Administered 2014-05-10: 10 mg via INTRAVENOUS
  Administered 2014-05-10: 25 mg via INTRAVENOUS
  Administered 2014-05-10 (×2): 10 mg via INTRAVENOUS
  Administered 2014-05-10: 25 mg via INTRAVENOUS

## 2014-05-10 MED ORDER — PROMETHAZINE HCL 25 MG/ML IJ SOLN: 6.2500 mg | INTRAMUSCULAR | Status: DC | PRN

## 2014-05-10 MED ORDER — CEFAZOLIN SODIUM-DEXTROSE 2-3 GM-% IV SOLR
INTRAVENOUS | Status: AC
  Filled 2014-05-10: qty 50

## 2014-05-10 MED ORDER — FENTANYL CITRATE 0.05 MG/ML IJ SOLN
INTRAMUSCULAR | Status: DC | PRN
  Administered 2014-05-10 (×5): 50 ug via INTRAVENOUS

## 2014-05-10 MED ORDER — ONDANSETRON HCL 4 MG PO TABS: 4.0000 mg | ORAL_TABLET | Freq: Four times a day (QID) | ORAL | Status: DC | PRN

## 2014-05-10 MED ORDER — LIDOCAINE HCL 1 % IJ SOLN
INTRAMUSCULAR | Status: AC
  Filled 2014-05-10: qty 20

## 2014-05-10 MED ORDER — DEXAMETHASONE SODIUM PHOSPHATE 10 MG/ML IJ SOLN
INTRAMUSCULAR | Status: DC | PRN
  Administered 2014-05-10: 10 mg via INTRAVENOUS

## 2014-05-10 MED ORDER — HEPARIN SOD (PORK) LOCK FLUSH 100 UNIT/ML IV SOLN
INTRAVENOUS | Status: DC | PRN
  Administered 2014-05-10: 500 [IU]

## 2014-05-10 MED ORDER — LACTATED RINGERS IV SOLN
INTRAVENOUS | Status: DC | PRN
  Administered 2014-05-10: 1

## 2014-05-10 MED ORDER — BUPIVACAINE-EPINEPHRINE (PF) 0.25% -1:200000 IJ SOLN
INTRAMUSCULAR | Status: AC
  Filled 2014-05-10: qty 30

## 2014-05-10 MED ORDER — BUPIVACAINE-EPINEPHRINE (PF) 0.25% -1:200000 IJ SOLN
INTRAMUSCULAR | Status: AC
Start: 2014-05-10 — End: 2014-05-10
  Filled 2014-05-10: qty 30

## 2014-05-10 MED ORDER — GLYCOPYRROLATE 0.2 MG/ML IJ SOLN
INTRAMUSCULAR | Status: DC | PRN
Start: 2014-05-10 — End: 2014-05-10
  Administered 2014-05-10: 0.6 mg via INTRAVENOUS
  Administered 2014-05-10: 0.2 mg via INTRAVENOUS

## 2014-05-10 MED ORDER — SODIUM CHLORIDE 0.9 % IR SOLN
Freq: Once | Status: AC
  Administered 2014-05-10: 08:00:00
  Filled 2014-05-10: qty 1.2

## 2014-05-10 MED ORDER — KCL IN DEXTROSE-NACL 20-5-0.45 MEQ/L-%-% IV SOLN
INTRAVENOUS | Status: DC
  Administered 2014-05-10: 15:00:00 via INTRAVENOUS
  Filled 2014-05-10 (×3): qty 1000

## 2014-05-10 MED ORDER — NEOSTIGMINE METHYLSULFATE 10 MG/10ML IV SOLN
INTRAVENOUS | Status: DC | PRN
  Administered 2014-05-10: 5 mg via INTRAVENOUS

## 2014-05-10 MED ORDER — BUPIVACAINE-EPINEPHRINE 0.25% -1:200000 IJ SOLN
INTRAMUSCULAR | Status: DC | PRN
  Administered 2014-05-10: 10 mL

## 2014-05-10 MED ORDER — HYDROMORPHONE HCL PF 1 MG/ML IJ SOLN
INTRAMUSCULAR | Status: DC | PRN
  Administered 2014-05-10 (×2): 1 mg via INTRAVENOUS

## 2014-05-10 MED ORDER — CLOBETASOL PROPIONATE 0.05 % EX CREA
1.0000 "application " | TOPICAL_CREAM | Freq: Every day | CUTANEOUS | Status: DC | PRN
  Filled 2014-05-10: qty 15

## 2014-05-10 MED ORDER — MIDAZOLAM HCL 2 MG/2ML IJ SOLN
INTRAMUSCULAR | Status: AC
  Filled 2014-05-10: qty 2

## 2014-05-10 MED ORDER — HEPARIN SODIUM (PORCINE) 5000 UNIT/ML IJ SOLN
5000.0000 [IU] | Freq: Three times a day (TID) | INTRAMUSCULAR | Status: DC
  Administered 2014-05-10 – 2014-05-11 (×3): 5000 [IU] via SUBCUTANEOUS
  Filled 2014-05-10 (×6): qty 1

## 2014-05-10 MED ORDER — HYDROCODONE-ACETAMINOPHEN 10-325 MG PO TABS: 1.0000 | ORAL_TABLET | Freq: Four times a day (QID) | ORAL | Status: DC | PRN

## 2014-05-10 MED ORDER — PROPOFOL 10 MG/ML IV BOLUS
INTRAVENOUS | Status: DC | PRN
  Administered 2014-05-10: 150 mg via INTRAVENOUS

## 2014-05-10 MED ORDER — GLYCOPYRROLATE 0.2 MG/ML IJ SOLN
INTRAMUSCULAR | Status: AC
  Filled 2014-05-10: qty 1

## 2014-05-10 MED ORDER — HEPARIN SOD (PORK) LOCK FLUSH 100 UNIT/ML IV SOLN
INTRAVENOUS | Status: AC
  Filled 2014-05-10: qty 5

## 2014-05-10 MED ORDER — ONDANSETRON HCL 4 MG/2ML IJ SOLN
INTRAMUSCULAR | Status: DC | PRN
  Administered 2014-05-10: 4 mg via INTRAVENOUS

## 2014-05-10 MED ORDER — MIDAZOLAM HCL 5 MG/5ML IJ SOLN
INTRAMUSCULAR | Status: DC | PRN
  Administered 2014-05-10: 2 mg via INTRAVENOUS

## 2014-05-10 MED ORDER — FENTANYL CITRATE 0.05 MG/ML IJ SOLN: 25.0000 ug | INTRAMUSCULAR | Status: DC | PRN

## 2014-05-10 MED ORDER — LACTATED RINGERS IV SOLN
INTRAVENOUS | Status: DC | PRN
  Administered 2014-05-10 (×2): via INTRAVENOUS

## 2014-05-10 MED ORDER — PROPOFOL 10 MG/ML IV BOLUS
INTRAVENOUS | Status: AC
  Filled 2014-05-10: qty 20

## 2014-05-10 MED ORDER — SODIUM CHLORIDE 0.9 % IJ SOLN
INTRAMUSCULAR | Status: AC
  Filled 2014-05-10: qty 10

## 2014-05-10 SURGICAL SUPPLY — 68 items
APL SKNCLS STERI-STRIP NONHPOA (GAUZE/BANDAGES/DRESSINGS)
BAG DECANTER FOR FLEXI CONT (MISCELLANEOUS) ×4 IMPLANT
BENZOIN TINCTURE PRP APPL 2/3 (GAUZE/BANDAGES/DRESSINGS) IMPLANT
BLADE HEX COATED 2.75 (ELECTRODE) ×4 IMPLANT
BLADE SURG 15 STRL LF DISP TIS (BLADE) ×2 IMPLANT
BLADE SURG 15 STRL SS (BLADE) ×2
CANISTER SUCTION 2500CC (MISCELLANEOUS) ×4 IMPLANT
CLOSURE WOUND 1/2 X4 (GAUZE/BANDAGES/DRESSINGS)
COVER SURGICAL LIGHT HANDLE (MISCELLANEOUS) ×4 IMPLANT
DECANTER SPIKE VIAL GLASS SM (MISCELLANEOUS) ×8 IMPLANT
DERMABOND ADVANCED (GAUZE/BANDAGES/DRESSINGS) ×2
DERMABOND ADVANCED .7 DNX12 (GAUZE/BANDAGES/DRESSINGS) ×2 IMPLANT
DEVICE SUT QUICK LOAD TK 5 (STAPLE) ×3 IMPLANT
DEVICE SUT TI-KNOT TK 5X26 (MISCELLANEOUS) ×3 IMPLANT
DEVICE SUTURE ENDOST 10MM (ENDOMECHANICALS) ×4 IMPLANT
DEVICE TI KNOT TK5 (MISCELLANEOUS) ×1
DEVICE TROCAR PUNCTURE CLOSURE (ENDOMECHANICALS) ×4 IMPLANT
DRAPE C-ARM 42X120 X-RAY (DRAPES) ×4 IMPLANT
DRAPE LAPAROSCOPIC ABDOMINAL (DRAPES) ×4 IMPLANT
DRAPE PED LAPAROTOMY (DRAPES) ×4 IMPLANT
ELECT REM PT RETURN 9FT ADLT (ELECTROSURGICAL) ×4
ELECTRODE REM PT RTRN 9FT ADLT (ELECTROSURGICAL) ×2 IMPLANT
ENDOSTITCH 0 SINGLE 48 (SUTURE) ×12 IMPLANT
GAUZE SPONGE 2X2 8PLY STRL LF (GAUZE/BANDAGES/DRESSINGS) IMPLANT
GAUZE SPONGE 4X4 12PLY STRL (GAUZE/BANDAGES/DRESSINGS) IMPLANT
GAUZE SPONGE 4X4 16PLY XRAY LF (GAUZE/BANDAGES/DRESSINGS) ×4 IMPLANT
GLOVE BIOGEL M 8.0 STRL (GLOVE) ×4 IMPLANT
GLOVE BIOGEL PI IND STRL 7.0 (GLOVE) ×2 IMPLANT
GLOVE BIOGEL PI INDICATOR 7.0 (GLOVE) ×2
GOWN SPEC L4 XLG W/TWL (GOWN DISPOSABLE) ×4 IMPLANT
GOWN STRL REUS W/TWL LRG LVL3 (GOWN DISPOSABLE) ×4 IMPLANT
GOWN STRL REUS W/TWL XL LVL3 (GOWN DISPOSABLE) ×12 IMPLANT
GUIDEWIRE ANG ZIPWIRE 035X150 (WIRE) ×4 IMPLANT
KIT BARDPORT ISP (Port) IMPLANT
KIT BARDPORT ISP 9.6FR (PORTABLE EQUIPMENT SUPPLIES) IMPLANT
KIT BASIN OR (CUSTOM PROCEDURE TRAY) ×4 IMPLANT
KIT PORT POWER 8FR ISP CVUE (Catheter) ×4 IMPLANT
NEEDLE HYPO 22GX1.5 SAFETY (NEEDLE) ×4 IMPLANT
NS IRRIG 1000ML POUR BTL (IV SOLUTION) ×4 IMPLANT
PACK BASIC VI WITH GOWN DISP (CUSTOM PROCEDURE TRAY) ×4 IMPLANT
PENCIL BUTTON HOLSTER BLD 10FT (ELECTRODE) ×4 IMPLANT
QUICK LOAD TK 5 (STAPLE) ×1
SCISSORS LAP 5X35 DISP (ENDOMECHANICALS) ×4 IMPLANT
SET IRRIG TUBING LAPAROSCOPIC (IRRIGATION / IRRIGATOR) ×4 IMPLANT
SHEARS HARMONIC ACE PLUS 36CM (ENDOMECHANICALS) ×4 IMPLANT
SLEEVE XCEL OPT CAN 5 100 (ENDOMECHANICALS) ×12 IMPLANT
SLEEVE Z-THREAD 5X100MM (TROCAR) IMPLANT
SOLUTION ANTI FOG 6CC (MISCELLANEOUS) ×4 IMPLANT
SPONGE GAUZE 2X2 STER 10/PKG (GAUZE/BANDAGES/DRESSINGS)
STRIP CLOSURE SKIN 1/2X4 (GAUZE/BANDAGES/DRESSINGS) IMPLANT
SUT PROLENE 2 0 CT2 30 (SUTURE) IMPLANT
SUT PROLENE 2 0 SH DA (SUTURE) ×8 IMPLANT
SUT VIC AB 2-0 SH 27 (SUTURE) ×2
SUT VIC AB 2-0 SH 27X BRD (SUTURE) ×2 IMPLANT
SUT VIC AB 4-0 SH 18 (SUTURE) ×8 IMPLANT
SYR 30ML LL (SYRINGE) ×4 IMPLANT
SYR BULB IRRIGATION 50ML (SYRINGE) IMPLANT
SYR CONTROL 10ML LL (SYRINGE) ×4 IMPLANT
SYRINGE 12CC LL (MISCELLANEOUS) ×4 IMPLANT
TOWEL OR 17X26 10 PK STRL BLUE (TOWEL DISPOSABLE) ×4 IMPLANT
TOWEL OR NON WOVEN STRL DISP B (DISPOSABLE) ×4 IMPLANT
TRAY FOLEY CATH 14FRSI W/METER (CATHETERS) ×4 IMPLANT
TRAY LAP CHOLE (CUSTOM PROCEDURE TRAY) ×4 IMPLANT
TROCAR BLADELESS OPT 5 100 (ENDOMECHANICALS) ×4 IMPLANT
TROCAR XCEL NON-BLD 11X100MML (ENDOMECHANICALS) ×4 IMPLANT
TROCAR XCEL UNIV SLVE 11M 100M (ENDOMECHANICALS) IMPLANT
TUBE MOSS GAS 18FR (TUBING) ×4 IMPLANT
TUBING INSUFFLATION 10FT LAP (TUBING) ×4 IMPLANT

## 2014-05-10 NOTE — Interval H&P Note (Signed)
History and Physical Interval Note:  05/10/2014 7:37 AM  Christopher Mullins  has presented today for surgery, with the diagnosis of tonsilar cancer  The various methods of treatment have been discussed with the patient and family. After consideration of risks, benefits and other options for treatment, the patient has consented to  Procedure(s): LAPAROSCOPIC POSSIBLE OPN  GASTROSTOMY TUBE (N/A) INSERTION PORT-A-CATH (N/A) as a surgical intervention .  The patient's history has been reviewed, patient examined, no change in status, stable for surgery.  I have reviewed the patient's chart and labs.  Questions were answered to the patient's satisfaction.     Maxine Fredman B

## 2014-05-10 NOTE — Op Note (Signed)
Surgeon: Kaylyn Lim, MD, FACS  Asst:  none  Anes:  general  Procedure: 1. Insertion of portacath through left subclavian; powerport 8 Fr.  2. Laparoscopic enterolysis (1.5 hours) with placement of gastrostomy tube  Diagnosis: Right tonsillar cancer  Complications: none  EBL:   8 cc  Drains: none  Description of Procedure:  The patient was taken to OR 6 at [facility].  After anesthesia was administered and the patient was prepped a timeout was performed.  The operations proceeded in 2 parts. The first part was the Port-A-Cath and this was performed first by prepping with PCMX throughout the chest. A left subclavian site was chosen. With the patient in Trendelenburg and with the endotracheal tube to repair I cannulated the left subclavian vein on the first the and passed the wire into the superior vena cava. I verified the position with the C-arm. Proceeded to create a pocket in the left chest and then passed the detached power port 8 [place] and prepared for insertion. It was flushed. The peel-away sheath and obturator were then passed over the wire and the wire and obturator were withdrawn. The catheter was then passed into the superior vena cava. It actually was passed into the right atrium the was withdrawn. Where it came to rest was not bleeding and with was probably more in the SVC near the heart. Good blood return was present. It was then cut and connected to the port which was then placed in the pocket and secured with a single Prolene. The 2 wounds were closed with 4-0 Vicryl and Dermabond.  The next portion of the case began after prepping the abdomen. Patient had a previous left paramedian incision through which he underwent a splenectomy as a teenager. Entered the abdomen easily to the right upper quadrant using 5 mm Optiview. I insufflated and then placed a 5 mm lower right side and one on the left side. Through those I began a very tedious 1.5 hour enteral lysis to take down the transverse  colon which along with the omentum was completely stuck to the intra-abdominal wall particularly along the midline incision. After this was completed and there was no evidence of any enterotomies also had to mobilize it greater curvature of the stomach which was stuck up where the spleen had been. When this had been done I then elected to begin to see how I could create the G-tube.  And an oscillating side down in the antrum fundus junction. I went inside the stomach with a single blade of the harmonic scalpel. I dilated the tract and then eventually got a 24 French peel-away sheath from interventional radiology. I passed it through the left upper quadrant 5 mm port inserted and was able to then dilate pass a wire into the stomach. With that in place I passed the [place] G-tube and blew up the balloon in brought it up to the anterior abdominal wall. Just shy of that I was able to then using Endo Stitch to suture remnant a pursestring suture of 2-0 Vicryl which were tied down. I then secured it to the abdominal wall with a laterally placed 2-0 Vicryl placed with a free needle and then used the Endo Close to tack it up to the fascia. This was a 2-0 Vicryl. It seemed to flush easily. It was secured to the skin with 40540 nylons and was secured to the ring the comes along with a with a 2-0 silk to tie down. The 3 other trocar incisions closed with  4 wire: Dermabond. Patient our the procedure well taken recovery room in satisfactory condition.  The patient tolerated the procedure well and was taken to the PACU in stable condition.     Matt B. Hassell Done, [place], [facility], [place]

## 2014-05-10 NOTE — Anesthesia Postprocedure Evaluation (Signed)
  Anesthesia Post-op Note  Patient: Christopher Mullins  Procedure(s) Performed: Procedure(s) (LRB): LAPAROSCOPIC GASTROSTOMY TUBE PLACEMENT (N/A) INSERTION PORT-A-CATH (Left) LAPAROSCOPIC LYSIS OF ADHESIONS (N/A)  Patient Location: PACU  Anesthesia Type: General  Level of Consciousness: awake and alert   Airway and Oxygen Therapy: Patient Spontanous Breathing  Post-op Pain: mild  Post-op Assessment: Post-op Vital signs reviewed, Patient's Cardiovascular Status Stable, Respiratory Function Stable, Patent Airway and No signs of Nausea or vomiting  Last Vitals:  Filed Vitals:   05/10/14 1440  BP: 121/75  Pulse: 74  Temp: 36.2 C  Resp: 16    Post-op Vital Signs: stable   Complications: No apparent anesthesia complications

## 2014-05-10 NOTE — Anesthesia Preprocedure Evaluation (Signed)
Anesthesia Evaluation  Patient identified by MRN, date of birth, ID band Patient awake    Reviewed: Allergy & Precautions, H&P , NPO status , Patient's Chart, lab work & pertinent test results  Airway Mallampati: II TM Distance: >3 FB Neck ROM: Full   Comment: H/O tonsillar cancer. Dental no notable dental hx.    Pulmonary neg pulmonary ROS, former smoker,  breath sounds clear to auscultation  Pulmonary exam normal       Cardiovascular negative cardio ROS  Rhythm:Regular Rate:Normal     Neuro/Psych negative neurological ROS  negative psych ROS   GI/Hepatic negative GI ROS, (+) Hepatitis -, C  Endo/Other  negative endocrine ROS  Renal/GU negative Renal ROS  negative genitourinary   Musculoskeletal negative musculoskeletal ROS (+)   Abdominal   Peds negative pediatric ROS (+)  Hematology negative hematology ROS (+)   Anesthesia Other Findings   Reproductive/Obstetrics negative OB ROS                           Anesthesia Physical Anesthesia Plan  ASA: II  Anesthesia Plan: General   Post-op Pain Management:    Induction: Intravenous  Airway Management Planned: Oral ETT  Additional Equipment:   Intra-op Plan:   Post-operative Plan: Extubation in OR  Informed Consent: I have reviewed the patients History and Physical, chart, labs and discussed the procedure including the risks, benefits and alternatives for the proposed anesthesia with the patient or authorized representative who has indicated his/her understanding and acceptance.   Dental advisory given  Plan Discussed with: CRNA  Anesthesia Plan Comments:         Anesthesia Quick Evaluation

## 2014-05-10 NOTE — Transfer of Care (Signed)
Immediate Anesthesia Transfer of Care Note  Patient: Christopher Mullins  Procedure(s) Performed: Procedure(s) (LRB): LAPAROSCOPIC GASTROSTOMY TUBE PLACEMENT (N/A) INSERTION PORT-A-CATH (Left) LAPAROSCOPIC LYSIS OF ADHESIONS (N/A)  Patient Location: PACU  Anesthesia Type: General  Level of Consciousness: sedated, patient cooperative and responds to stimulation  Airway & Oxygen Therapy: Patient Spontanous Breathing and Patient connected to face mask oxgen  Post-op Assessment: Report given to PACU RN and Post -op Vital signs reviewed and stable  Post vital signs: Reviewed and stable  Complications: No apparent anesthesia complications

## 2014-05-10 NOTE — H&P (View-Only) (Signed)
Chief Complaint:  Right tonsillar cancer; need for feeding tube and Port-A-Cath for chemotherapy  History of Present Illness:  Christopher Mullins is an 58 y.o. 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 This was found when he first initially found a mass in his right neck. He is scheduled to begin chemo radiation therapy on July 1.  Because he has had a prior open splenectomy for trauma he will need to have this performed an open or laparoscopic fashion.  At the time of his splenectomy for trauma he received multiple units of blood and from that and contracted hepatitis C. He is not recorded treatment for this as yet.  Past Medical History  Diagnosis Date  . 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 Cancer   . Hepatitis C   . Psoriasis     Past Surgical History  Procedure Laterality Date  . Splenectomy, total      at the age of 40  . Colonscopy  2010  . Tonsil biopsy    . Liver biopsy      Current Outpatient Prescriptions  Medication Sig Dispense Refill  . amoxicillin (AMOXIL) 500 MG capsule       . HYDROcodone-acetaminophen (NORCO) 10-325 MG per tablet       . sodium fluoride (FLUORISHIELD) 1.1 % GEL dental gel Instill one drop of fluoride per tooth space of fluoride tray. Place over teeth for 5 minutes. Remove. Spit out excess. Repeat nightly.  120 mL  prn   No current facility-administered medications for this visit.   Sulfa antibiotics Family History  Problem Relation Age of Onset  . Cancer Neg Hx    Social History:   reports that he quit smoking about 2 years ago. His smoking use included Cigarettes. He has a 30 pack-year smoking history. He has never used smokeless tobacco. He reports that he drinks alcohol. He reports that he does not use illicit drugs.   REVIEW OF SYSTEMS : Positive for blood transfusions and hepatitis C, prior exploratory laparotomy ; otherwise negative  Physical Exam:   Blood pressure 130/90, pulse 61,  temperature 98 F (36.7 C), resp. rate 14, height 5\' 8"  (1.727 m), weight 193 lb 3.2 oz (87.635 kg). Body mass index is 29.38 kg/(m^2).  Gen:  WDWN white male NAD  Neurological: Alert and oriented to person, place, and time. Motor and sensory function is grossly intact  Head: Normocephalic and atraumatic.  Eyes: Conjunctivae are normal. Pupils are equal, round, and reactive to light. No scleral icterus.  Neck: Normal range of motion. There is a palpable firmness in the right jaw area. No tracheal deviation or thyromegaly present.  Cardiovascular:  SR without murmurs or gallops.  No carotid bruits Breast:  Not examined Respiratory: Effort normal.  No respiratory distress. No chest wall tenderness. Breath sounds normal.  No wheezes, rales or rhonchi.  Abdomen:  Midline incision from splenectomy. GU:  Not examined Musculoskeletal: Normal range of motion. Extremities are nontender. No cyanosis, edema or clubbing noted Lymphadenopathy: No cervical, preauricular, postauricular or axillary adenopathy is present Skin: Skin is warm and dry. No rash noted. No diaphoresis. No erythema. No pallor. Pscyh: Normal mood and affect. Behavior is normal. Judgment and thought content normal.   LABORATORY RESULTS: Results for orders placed during the hospital encounter of 05/06/14 (from the past 48 hour(s))  BUN AND CREATININE (CC13)     Status: None   Collection Time    05/06/14 12:58 PM  Result Value Ref Range   BUN 16.9  7.0 - 26.0 mg/dL   Creatinine 0.9  0.7 - 1.3 mg/dL     RADIOLOGY RESULTS: No results found.  Problem List: Patient Active Problem List   Diagnosis Date Noted  . Tonsil cancer 04/17/2014  . Hepatitis C 04/17/2014  . Psoriasis 04/17/2014    Assessment & Plan: Tonsillar cancer on the right side with prior laparotomy precluding PEG.  Plan left subclavian portacath and placement of lap/open G tube    Matt B. Hassell Done, MD, [facility] [phone]  beeper [phone]  05/07/2014 2:59 PM

## 2014-05-11 LAB — CBC
HEMATOCRIT: 37.3 % — AB (ref 39.0–52.0)
HEMOGLOBIN: 12.7 g/dL — AB (ref 13.0–17.0)
MCH: 30.9 pg (ref 26.0–34.0)
MCHC: 34 g/dL (ref 30.0–36.0)
MCV: 90.8 fL (ref 78.0–100.0)
Platelets: 180 10*3/uL (ref 150–400)
RBC: 4.11 MIL/uL — AB (ref 4.22–5.81)
RDW: 12.7 % (ref 11.5–15.5)
WBC: 8.7 10*3/uL (ref 4.0–10.5)

## 2014-05-11 LAB — BASIC METABOLIC PANEL
BUN: 14 mg/dL (ref 6–23)
CHLORIDE: 103 meq/L (ref 96–112)
CO2: 25 meq/L (ref 19–32)
CREATININE: 0.87 mg/dL (ref 0.50–1.35)
Calcium: 9 mg/dL (ref 8.4–10.5)
GFR calc Af Amer: >90 mL/min (ref >90)
GFR calc non Af Amer: >90 mL/min (ref >90)
GLUCOSE: 127 mg/dL — AB (ref 70–99)
Potassium: 4.6 mEq/L (ref 3.7–5.3)
Sodium: 139 mEq/L (ref 137–147)

## 2014-05-11 MED ORDER — HYDROCODONE-ACETAMINOPHEN 5-325 MG PO TABS: 1.0000 | ORAL_TABLET | ORAL | Status: DC | PRN

## 2014-05-11 NOTE — Discharge Instructions (Addendum)
Light activities for one week. Soft diet today, resume normal diet tomorrow. Call for heavy bleeding, wound problems or g-tube problems.    Gastrostomy Tube Home Guide A gastrostomy tube is a tube that is surgically placed into the stomach. It is also called a "G-tube." G-tubes are used when a person is unable to eat and drink enough on their own to stay healthy. The tube is inserted into the stomach through a small cut (incision)in the skin. This tube is used for:  Feeding.  Giving medication. GASTROSTOMY TUBE CARE  Wash your hands with soap and water.  Remove the old dressing (if any). Some styles of G-tubes may need a dressing inserted between the skin and the G-tube. Keep dressing dry until Monday then remove it.  May shower on Monday.  Apply a dry dressing to the site daily.Check the area where the tube enters the skin (insertion site) for redness, swelling, or pus-like (purulent) drainage. A small amount of clear or tan liquid drainage is normal. Check to make sure scar tissue(skin) is not growing around the insertion site. This could have a raised, bumpy appearance.  A cotton swab can be used to clean the skin around the tube:  When the G-tube is first put in, a normal saline solution or water can be used to clean the skin.  Mild soap and warm water can be used when the skin around the G-tube site has healed.  Roll the cotton swab around the G-tube insertion site to remove any drainage or crusting at the insertion site. STOMACH RESIDUALS Feeding tube residuals are the amount of liquids that are in the stomach at any given time. Residuals may be checked before giving feedings, medications, or as instructed by your health care provider.  Ask your health care provider if there are instances when you would not start tube feedings depending on the amount or type of contents withdrawn from the stomach.  Check residuals by attaching a syringe to the G-tube and pulling back on the  syringe plunger. Note the amount, and return the residual back into the stomach. FLUSHING THE G-TUBE-this will be initiated by the cancer center  The G-tube should be periodically flushed with clean warm water to keep it from clogging.  Flush the G-tube after feedings or medications. Draw up 30 mL of warm water in a syringe. Connect the syringe to the G-tube and slowly push the water into the tube.  Do not push feedings, medications, or flushes rapidly. Flush the G-tube gently and slowly.  Only use syringes made for G-tubes to flush medications or feedings.  Your health care provider may want the G-tube flushed more often or with more water. If this is the case, follow your health care provider's instructions. FEEDINGS Your health care provider will determine whether feedings are given as a bolus (a certain amount given at one time and at scheduled times) or whether feedings will be given continuously on a feeding pump.   Formulas should be given at room temperature.  If feedings are continuous, no more than 4 hours worth of feedings should be placed in the feeding bag. This helps prevent spoilage or accidental excess infusion.  Cover and place unused formula in the refrigerator.  If feedings are continuous, stop the feedings when medications or flushes are given. Be sure to restart the feedings.  Feeding bags and syringes should be replaced as instructed by your health care provider. GIVING MEDICATION   In general, it is best if all medications  are in a liquid form for G-tube administration. Liquid medications are less likely to clog the G-tube.  Mix the liquid medication with 30 mL (or amount recommended by your health care provider) of warm water.  Draw up the medication into the syringe.  Attach the syringe to the G-tube and slowly push the mixture into the G-tube.  After giving the medication, draw up 30 mL of warm water in the syringe and slowly flush the G-tube.  For pills  or capsules, check with your health care provider first before crushing medications. Some pills are not effective if they are crushed. Some capsules are sustained release medications.  If appropriate, crush the pill or capsule and mix with 30 mL of warm water. Using the syringe, slowly push the medication through the tube, then flush the tube with another 30 mL of tap water. G-TUBE PROBLEMS G-tube was pulled out.  Cause: May have been pulled out accidentally.  Solutions: Cover the opening with clean dressing and tape. Call your health care provider right away. The G-tube should be put in as soon as possible (within 4 hours) so the G-tube opening (tract) does not close. The G-tube needs to be put in at a health care setting. An X-ray needs to be done to confirm placement before the G-tube can be used again. Redness, irritation, soreness, or foul odor around the gastrostomy site.  Cause: May be caused by leakage or infection.  Solutions: Call your health care provider right away. Large amount of leakage of fluid or mucus-like liquid present (a large amount means it soaks clothing).  Cause: Many reasons could cause the G-tube to leak.  Solutions: Call your health care provider to discuss the amount of leakage. Skin or scar tissue appears to be growing where tube enters skin.   Cause: Tissue growth may develop around the insertion site if the G-tube is moved or pulled on excessively.  Solutions: Secure tube with tape so that excess movement does not occur. Call your health care provider. G-tube is clogged.  Cause: Thick formula or medication.  Solutions: Try to slowly push warm water into the tube with a large syringe. Never try to push any object into the tube to unclog it. Do not force fluid into the G-tube. If you are unable to unclog the tube, call your health care provider right away. TIPS  Head of bed (HOB) position refers to the upright position of a person's upper body.  When  giving medications or a feeding bolus, keep the [facility] up as told by your health care provider. Do this during the feeding and for 1 hour after the feeding or medication administration.  If continuous feedings are being given, it is best to keep the [facility] up as told by your health care provider. When ADLs (activities of daily living) are performed and the [facility] needs to be flat, be sure to turn the feeding pump off. Restart the feeding pump when the [facility] is returned to the recommended height.  Do not pull or put tension on the tube.  To prevent fluid backflow, kink the G-tube before removing the cap or disconnecting a syringe.  Check the G-tube length every day. Measure from the insertion site to the end of the G-tube. If the length is longer than previous measurements, the tube may be coming out. Call your health care provider if you notice increasing G-tube length.  Oral care, such as brushing teeth, must be continued.  You may need to remove excess air (  vent) from the G-tube. Your health care provider will tell you if this is needed.  Always call your health care provider if you have questions or problems with the G-tube. SEEK IMMEDIATE MEDICAL CARE IF:   You have severe abdominal pain, tenderness, or abdominal bloating (distension).  You have nausea or vomiting.  You are constipated or have problems moving your bowels.  The G-tube insertion site is red, swollen, has a foul smell, or has yellow or brown drainage.  You have difficulty breathing or shortness of breath.  You have a fever.  You have a large amount of feeding tube residuals.  The G-tube is clogged and cannot be flushed. MAKE SURE YOU:   Understand these instructions.  Will watch your condition.  Will get help right away if you are not doing well or get worse. Document Released: 01/10/2002 Document Revised: 11/06/2013 Document Reviewed: 07/09/2013 [employer] Patient Information 2015 [place], [place]. This information is not  intended to replace advice given to you by your health care provider. Make sure you discuss any questions you have with your health care provider.

## 2014-05-11 NOTE — Progress Notes (Signed)
1 Day Post-Op  Subjective: Comfortable.  Tolerating liquids.  Objective: Vital signs in last 24 hours: Temp:  [97.2 F (36.2 C)-98.3 F (36.8 C)] 98.3 F (36.8 C) (06/27 1000) Pulse Rate:  [52-80] 60 (06/27 1000) Resp:  [12-20] 18 (06/27 1000) BP: (114-147)/(62-88) 121/77 mmHg (06/27 1000) SpO2:  [94 %-100 %] 96 % (06/27 1000) Weight:  [190 lb (86.183 kg)] 190 lb (86.183 kg) (06/26 1341)    Intake/Output from previous day: 06/26 0701 - 06/27 0700 In: 3920 [P.O.:120; I.V.:3800] Out: 4166 [Urine:3125; Blood:50] Intake/Output this shift: Total I/O In: -  Out: 1000 [Urine:1000]  PE: General- In NAD Chest-Left sided PAC site clean and intact Abdomen-soft, incisions clean and intact, g-tube in LUQ with dry dressing.  Lab Results:   Recent Labs  05/10/14 1420 05/11/14 0518  WBC 8.4 8.7  HGB 13.9 12.7*  HCT 39.8 37.3*  PLT 194 180   BMET  Recent Labs  05/09/14 1130 05/10/14 1420 05/11/14 0518  NA 140  --  139  K 4.4  --  4.6  CL 104  --  103  CO2 23  --  25  GLUCOSE 102*  --  127*  BUN 17  --  14  CREATININE 0.86 0.95 0.87  CALCIUM 9.2  --  9.0   PT/INR No results found for this basename: LABPROT, INR,  in the last 72 hours Comprehensive Metabolic Panel:    Component Value Date/Time   NA 139 05/11/2014 0518   NA 140 05/09/2014 1130   K 4.6 05/11/2014 0518   K 4.4 05/09/2014 1130   CL 103 05/11/2014 0518   CL 104 05/09/2014 1130   CO2 25 05/11/2014 0518   CO2 23 05/09/2014 1130   BUN 14 05/11/2014 0518   BUN 17 05/09/2014 1130   BUN 16.9 05/06/2014 1258   CREATININE 0.87 05/11/2014 0518   CREATININE 0.95 05/10/2014 1420   CREATININE 0.9 05/06/2014 1258   GLUCOSE 127* 05/11/2014 0518   GLUCOSE 102* 05/09/2014 1130   CALCIUM 9.0 05/11/2014 0518   CALCIUM 9.2 05/09/2014 1130   AST 108* 05/09/2014 1130   ALT 180* 05/09/2014 1130   ALKPHOS 43 05/09/2014 1130   BILITOT 0.4 05/09/2014 1130   PROT 7.3 05/09/2014 1130   ALBUMIN 3.6 05/09/2014 1130      Studies/Results: Dg Chest Port 1 View  05/10/2014   CLINICAL DATA:  Port-A-Cath placement.  EXAM: PORTABLE CHEST - 1 VIEW  COMPARISON:  PET-CT 04/15/2014  FINDINGS: There has been interval placement of a left subclavian Port-A-Cath with tip near the cavoatrial junction. Cardiomediastinal silhouette is within normal limits. Thoracic aortic calcification is noted. The lungs are well inflated. Interstitial markings are mildly prominent bilaterally without evidence of confluent airspace opacity, pleural effusion, or pneumothorax. Free air is seen underneath the right greater than left hemidiaphragms. No acute osseous abnormality is identified.  IMPRESSION: 1. Left subclavian Port-A-Cath placement with tip near the cavoatrial junction. No pneumothorax. 2. Pneumoperitoneum, consistent with recent laparoscopic gastrostomy tube placement.   Electronically Signed   By: Logan Bores   On: 05/10/2014 13:12   Dg C-arm 1-60 Min-no Report  05/10/2014   CLINICAL DATA: port a cath tube placement   C-ARM 1-60 MINUTES  Fluoroscopy was utilized by the requesting physician.  No radiographic  interpretation.     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    LOS: 1 day   Plan:   Discharge.  Instructions given.   ROSENBOWER,TODD Lenna Sciara 05/11/2014

## 2014-05-11 NOTE — Progress Notes (Signed)
Assessment unchanged. Pt and wife verbalized understanding of dc instructions through teach back. Script x 1 given as provided by MD. Discharged via wc to front entrance to meet awaiting vehicle to carry home. Accompanied by NT and wife.

## 2014-05-12 NOTE — Discharge Summary (Signed)
Physician Discharge Summary  Patient ID: Christopher Mullins MRN: [id number] DOB/AGE: 01-17-1956 58 y.o.  Admit date: 05/10/2014 Discharge date: 05/12/2014  Admission Diagnoses:  Tonsillar cancer  Discharge Diagnoses:  Active Problems:   Tonsillar cancer   Discharged Condition: good  Hospital Course: He was admitted and underwent Port-A-Cath placement and laparoscopic gastrostomy placement which he tolerated well. He was able to be discharged on his first postoperative day. Discharge instructions were given to him.  Consults: None  Significant Diagnostic Studies: none  Treatments: surgery: Port-A-Cath insertion and laparoscopic gastrostomy.  Discharge Exam: Blood pressure 121/77, pulse 60, temperature 98.3 F (36.8 C), temperature source Oral, resp. rate 18, height 5\' 8"  (1.727 m), weight 190 lb (86.183 kg), SpO2 96.00%.   Disposition: 01-Home or Self Care     Medication List    STOP taking these medications       HYDROcodone-acetaminophen 10-325 MG per tablet  Commonly known as:  NORCO  Replaced by:  HYDROcodone-acetaminophen 5-325 MG per tablet      TAKE these medications       amoxicillin 500 MG capsule  Commonly known as:  AMOXIL  Take 500 mg by mouth 3 (three) times daily. Started 04/30/14. 14 day course     clobetasol cream 0.05 %  Commonly known as:  TEMOVATE  Apply 1 application topically daily as needed (Psorasis).     HYDROcodone-acetaminophen 5-325 MG per tablet  Commonly known as:  NORCO/VICODIN  Take 1-2 tablets by mouth every 4 (four) hours as needed for moderate pain.     sodium fluoride 1.1 % Gel dental gel  Commonly known as:  FLUORISHIELD  Instill one drop of fluoride per tooth space of fluoride tray. Place over teeth for 5 minutes. Remove. Spit out excess. Repeat nightly.           Follow-up Information   Follow up with Johnathan Hausen B, MD In 2 weeks.   Specialty:  General Surgery   Contact information:   [address] [place] [address] [phone]       Signed: Odis Hollingshead 05/12/2014, 3:13 PM

## 2014-05-13 ENCOUNTER — Ambulatory Visit: Payer: BC Managed Care – PPO

## 2014-05-13 ENCOUNTER — Encounter: Payer: Self-pay | Admitting: Hematology and Oncology

## 2014-05-13 ENCOUNTER — Ambulatory Visit (HOSPITAL_BASED_OUTPATIENT_CLINIC_OR_DEPARTMENT_OTHER): Payer: BC Managed Care – PPO | Admitting: Hematology and Oncology

## 2014-05-13 ENCOUNTER — Other Ambulatory Visit (HOSPITAL_BASED_OUTPATIENT_CLINIC_OR_DEPARTMENT_OTHER): Payer: BC Managed Care – PPO

## 2014-05-13 ENCOUNTER — Other Ambulatory Visit: Payer: Self-pay | Admitting: Hematology and Oncology

## 2014-05-13 ENCOUNTER — Encounter (HOSPITAL_COMMUNITY): Payer: Self-pay | Admitting: Surgery

## 2014-05-13 ENCOUNTER — Encounter: Payer: Self-pay | Admitting: *Deleted

## 2014-05-13 ENCOUNTER — Ambulatory Visit: Payer: BC Managed Care – PPO | Admitting: Nutrition

## 2014-05-13 VITALS — BP 130/75 | HR 80 | Temp 97.9°F | Resp 20 | Ht 68.0 in | Wt 184.2 lb

## 2014-05-13 DIAGNOSIS — H109 Unspecified conjunctivitis: Secondary | ICD-10-CM

## 2014-05-13 DIAGNOSIS — R7401 Elevation of levels of liver transaminase levels: Secondary | ICD-10-CM

## 2014-05-13 DIAGNOSIS — R7402 Elevation of levels of lactic acid dehydrogenase (LDH): Secondary | ICD-10-CM

## 2014-05-13 DIAGNOSIS — C099 Malignant neoplasm of tonsil, unspecified: Secondary | ICD-10-CM

## 2014-05-13 DIAGNOSIS — B1920 Unspecified viral hepatitis C without hepatic coma: Secondary | ICD-10-CM

## 2014-05-13 DIAGNOSIS — R740 Nonspecific elevation of levels of transaminase and lactic acid dehydrogenase [LDH]: Secondary | ICD-10-CM

## 2014-05-13 DIAGNOSIS — R109 Unspecified abdominal pain: Secondary | ICD-10-CM

## 2014-05-13 HISTORY — DX: Unspecified conjunctivitis: H10.9

## 2014-05-13 LAB — COMPREHENSIVE METABOLIC PANEL (CC13)
ALT: 228 U/L — AB (ref 0–55)
ANION GAP: 10 meq/L (ref 3–11)
AST: 150 U/L — ABNORMAL HIGH (ref 5–34)
Albumin: 3.6 g/dL (ref 3.5–5.0)
Alkaline Phosphatase: 44 U/L (ref 40–150)
BILIRUBIN TOTAL: 0.6 mg/dL (ref 0.20–1.20)
BUN: 12.3 mg/dL (ref 7.0–26.0)
CALCIUM: 9.5 mg/dL (ref 8.4–10.4)
CHLORIDE: 105 meq/L (ref 98–109)
CO2: 26 meq/L (ref 22–29)
CREATININE: 0.9 mg/dL (ref 0.7–1.3)
GLUCOSE: 110 mg/dL (ref 70–140)
Potassium: 3.7 mEq/L (ref 3.5–5.1)
Sodium: 141 mEq/L (ref 136–145)
Total Protein: 7.5 g/dL (ref 6.4–8.3)

## 2014-05-13 LAB — CBC WITH DIFFERENTIAL/PLATELET
BASO%: 0.5 % (ref 0.0–2.0)
BASOS ABS: 0 10*3/uL (ref 0.0–0.1)
EOS ABS: 0.2 10*3/uL (ref 0.0–0.5)
EOS%: 3.5 % (ref 0.0–7.0)
HCT: 46.1 % (ref 38.4–49.9)
HEMOGLOBIN: 15.5 g/dL (ref 13.0–17.1)
LYMPH#: 2.3 10*3/uL (ref 0.9–3.3)
LYMPH%: 37.7 % (ref 14.0–49.0)
MCH: 31.1 pg (ref 27.2–33.4)
MCHC: 33.6 g/dL (ref 32.0–36.0)
MCV: 92.4 fL (ref 79.3–98.0)
MONO#: 0.9 10*3/uL (ref 0.1–0.9)
MONO%: 14.9 % — ABNORMAL HIGH (ref 0.0–14.0)
NEUT#: 2.6 10*3/uL (ref 1.5–6.5)
NEUT%: 43.4 % (ref 39.0–75.0)
PLATELETS: 202 10*3/uL (ref 140–400)
RBC: 4.99 10*6/uL (ref 4.20–5.82)
RDW: 12.5 % (ref 11.0–14.6)
WBC: 6 10*3/uL (ref 4.0–10.3)

## 2014-05-13 LAB — MAGNESIUM (CC13): MAGNESIUM: 2 mg/dL (ref 1.5–2.5)

## 2014-05-13 LAB — HEPATITIS C ANTIBODY: HCV AB: REACTIVE — AB

## 2014-05-13 MED ORDER — LIDOCAINE-PRILOCAINE 2.5-2.5 % EX CREA: 1.0000 "application " | TOPICAL_CREAM | CUTANEOUS | Status: DC | PRN

## 2014-05-13 MED ORDER — TOBRAMYCIN 0.3 % OP SOLN: 1.0000 [drp] | OPHTHALMIC | Status: DC

## 2014-05-13 MED ORDER — PROCHLORPERAZINE MALEATE 10 MG PO TABS: 10.0000 mg | ORAL_TABLET | Freq: Four times a day (QID) | ORAL | Status: DC | PRN

## 2014-05-13 MED ORDER — ONDANSETRON HCL 8 MG PO TABS: 8.0000 mg | ORAL_TABLET | Freq: Three times a day (TID) | ORAL | Status: DC | PRN

## 2014-05-13 NOTE — Assessment & Plan Note (Signed)
He is noted to have elevated liver function tests. I will order hepatitis C panel to make sure he has no reactivation of hepatitis C.

## 2014-05-13 NOTE — Progress Notes (Signed)
[place] OFFICE PROGRESS NOTE  Patient Care Team: Gavin Pound, MD as PCP - General (Family Medicine) Brooks Sailors, RN as Oncology Nurse Navigator (Oncology)  SUMMARY OF ONCOLOGIC HISTORY: Oncology History   Tonsil cancer, Right, HPV positive,    Primary site: Pharynx - Oropharynx   Staging method: AJCC 7th Edition   Clinical: Stage IVA (T1, N2, M0) signed by Heath Lark, MD on 04/17/2014  1:04 PM   Summary: Stage IVA (T1, N2, M0)       Tonsil cancer   03/20/2014 Imaging Ct scan of neck showed several complex solid and cystic lesions within the right neck and abnormalities in the pharynx   03/25/2014 Procedure 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 Multiple hypermetabolic right level 2 lymph nodes compatible with metastatic adenopathy   05/10/2014 Surgery The patient has placement of port and feeding tube.    INTERVAL HISTORY: Please see below for problem oriented charting. He complained of left shoulder pain related to after surgery. Has a little sore throat. Overall, denies recent nausea, vomiting, swallowing difficulties weight loss.  REVIEW OF SYSTEMS:   Constitutional: Denies fevers, chills or abnormal weight loss Eyes: Denies blurriness of vision Ears, nose, mouth, throat, and face: Denies mucositis or sore throat Respiratory: Denies cough, dyspnea or wheezes Cardiovascular: Denies palpitation, chest discomfort or lower extremity swelling Gastrointestinal:  Denies nausea, heartburn or change in bowel habits Skin: Denies abnormal skin rashes Lymphatics: Denies new lymphadenopathy or easy bruising Neurological:Denies numbness, tingling or new weaknesses Behavioral/Psych: Mood is stable, no new changes  All other systems were reviewed with the patient and are  negative.  I have reviewed the past medical history, past surgical history, social history and family history with the patient and they are unchanged from previous note.  ALLERGIES:  is allergic to sulfa antibiotics.  MEDICATIONS:  Current Outpatient Prescriptions  Medication Sig Dispense Refill  . amoxicillin (AMOXIL) 500 MG capsule Take 500 mg by mouth 3 (three) times daily. Started 04/30/14. 14 day course      . sodium fluoride (FLUORISHIELD) 1.1 % GEL dental gel Instill one drop of fluoride per tooth space of fluoride tray. Place over teeth for 5 minutes. Remove. Spit out excess. Repeat nightly.  120 mL  prn  . clobetasol cream (TEMOVATE) 6.96 % Apply 1 application topically daily as needed (Psorasis).       Marland Kitchen HYDROcodone-acetaminophen (NORCO/VICODIN) 5-325 MG per tablet Take 1-2 tablets by mouth every 4 (four) hours as needed for moderate pain.  30 tablet  0  . lidocaine-prilocaine (EMLA) cream Apply 1 application topically as needed. Apply to [facility] a Cath site one hour prior to needle stick.  30 g  3  . ondansetron (ZOFRAN) 8 MG tablet Take 1 tablet (8 mg total) by mouth every 8 (eight) hours as needed for nausea or vomiting. Start on the third day after chemotherapy.  30 tablet  1  . prochlorperazine (COMPAZINE) 10 MG tablet Take 1 tablet (10 mg total) by mouth every 6 (six) hours as needed (Nausea or vomiting).  30 tablet  1   No current facility-administered medications for this visit.    PHYSICAL EXAMINATION: ECOG PERFORMANCE STATUS: 1 - Symptomatic but completely ambulatory  Filed Vitals:   05/13/14 1211  BP: 130/75  Pulse: 80  Temp: 97.9 F (36.6 C)  Resp: 20   Filed Weights   05/13/14 1211  Weight: 184 lb 3.2 oz (83.553 kg)    GENERAL:alert, no distress and comfortable SKIN: skin color, texture, turgor are normal, no rashes or significant lesions EYES: normal, Conjunctiva are pink and non-injected, sclera clear OROPHARYNX:no exudate, no erythema and lips, buccal  mucosa, and tongue normal  NECK: supple, thyroid normal size, non-tender, without nodularity LYMPH: Persistent palpable neck mass on the right side of the neck. LUNGS: clear to auscultation and percussion with normal breathing effort HEART: regular rate & rhythm and no murmurs and no lower extremity edema ABDOMEN:abdomen soft, non-tender and normal bowel sounds. Feeding tube site looks okay Musculoskeletal:no cyanosis of digits and no clubbing . Port looks okay NEURO: alert & oriented x 3 with fluent speech, no focal motor/sensory deficits  LABORATORY DATA:  I have reviewed the data as listed    Component Value Date/Time   NA 141 05/13/2014 1159   NA 139 05/11/2014 0518   K 3.7 05/13/2014 1159   K 4.6 05/11/2014 0518   CL 103 05/11/2014 0518   CO2 26 05/13/2014 1159   CO2 25 05/11/2014 0518   GLUCOSE 110 05/13/2014 1159   GLUCOSE 127* 05/11/2014 0518   BUN 12.3 05/13/2014 1159   BUN 14 05/11/2014 0518   CREATININE 0.9 05/13/2014 1159   CREATININE 0.87 05/11/2014 0518   CALCIUM 9.5 05/13/2014 1159   CALCIUM 9.0 05/11/2014 0518   PROT 7.5 05/13/2014 1159   PROT 7.3 05/09/2014 1130   ALBUMIN 3.6 05/13/2014 1159   ALBUMIN 3.6 05/09/2014 1130   AST 150* 05/13/2014 1159   AST 108* 05/09/2014 1130   ALT 228* 05/13/2014 1159   ALT 180* 05/09/2014 1130   ALKPHOS 44 05/13/2014 1159   ALKPHOS 43 05/09/2014 1130   BILITOT 0.60 05/13/2014 1159   BILITOT 0.4 05/09/2014 1130   GFRNONAA >90 05/11/2014 0518   GFRAA >90 05/11/2014 0518    No results found for this basename: SPEP, UPEP,  kappa and lambda light chains    Lab Results  Component Value Date   WBC 6.0 05/13/2014   NEUTROABS 2.6 05/13/2014   HGB 15.5 05/13/2014   HCT 46.1 05/13/2014   MCV 92.4 05/13/2014   PLT 202 05/13/2014      Chemistry      Component Value Date/Time   NA 141 05/13/2014 1159   NA 139 05/11/2014 0518   K 3.7 05/13/2014 1159   K 4.6 05/11/2014 0518   CL 103 05/11/2014 0518   CO2 26 05/13/2014 1159   CO2 25 05/11/2014 0518   BUN  12.3 05/13/2014 1159   BUN 14 05/11/2014 0518   CREATININE 0.9 05/13/2014 1159   CREATININE 0.87 05/11/2014 0518      Component Value Date/Time   CALCIUM 9.5 05/13/2014 1159   CALCIUM 9.0 05/11/2014 0518   ALKPHOS 44 05/13/2014 1159   ALKPHOS 43 05/09/2014 1130   AST 150* 05/13/2014 1159   AST 108* 05/09/2014 1130   ALT 228* 05/13/2014 1159   ALT 180* 05/09/2014 1130   BILITOT 0.60 05/13/2014 1159   BILITOT 0.4 05/09/2014 1130      ASSESSMENT & PLAN:  Tonsil cancer We discussed the role of chemotherapy. The intent is for cure.  We discussed some of the risks, benefits, side-effects of cisplatin. Some of the short term side-effects included, though not limited to, including weight loss, life threatening infections, risk of allergic reactions, need for transfusions of blood products, nausea, vomiting, change in bowel  habits, loss of hair, admission to hospital for various reasons, and risks of death.   Long term side-effects are also discussed including risks of infertility, permanent damage to nerve function, hearing loss, chronic fatigue, kidney damage with possibility needing hemodialysis, and rare secondary malignancy including bone marrow disorders.  The patient is aware that the response rates discussed earlier is not guaranteed.  After a long discussion, patient made an informed decision to proceed with the prescribed plan of care and went ahead to sign the consent form today.   Patient education material was dispensed. Treatment decision is based on NCCN guidelines, 100 mg/m2 every 3 weeks for total of 3 days with concurrent radiation treatment. 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 Burt Knack, M.D., Lucille Passy. Georjean Mode, Ph.D., Arlene A. Jasper Loser, M.D., Blase Mess, M.D., Darrick Penna. Megan Salon, M.D., Phillips Jonna Munro, M.D., Domingo Cocking. Lyndon Code, M.D., Sallye Lat, M.D., Luciano Cutter, M.D., Polly Cobia, M.D., Minerva Ends, M.D., Sandre Kitty, M.D., K.S. Mariane Duval, M.D., Trinidad Curet, M.D., Meryl Dare, M.D., and Danae Orleans. Fu, M.D. for the [facility] [address] [employer] [facility] 2004; [phone], 2004DOI: 10.1056/[id number]     Hepatitis C He is noted to have elevated liver function tests. I will order hepatitis C panel to make sure he has no reactivation of hepatitis C.  Elevated transaminase level This could be related to a reaction to recent anesthesia. I will monitored carefully. I have checked with the pharmacist and we would not need to reduce the dose of cisplatin as it is not cleared by liver function.  Referred abdominal pain He has left shoulder pain related to recent surgery. I recommend conservative management and take pain medicine as needed.   Orders Placed This Encounter  Procedures  . Ambulatory referral to Home Health    Referral Priority:  Routine    Referral Type:  Home Health Care    Referral Reason:  Specialty Services Required    Requested Specialty:  [place]    Number of Visits Requested:  1   All questions were answered. The patient knows to call the clinic with any problems, questions or concerns. No barriers to learning was detected.Alvy Bimler, NI, MD 05/13/2014 1:32 PM

## 2014-05-13 NOTE — Progress Notes (Signed)
To provide support, encouragement and care continuity, met with patient and his wife during appt with Dr. Alvy Bimler.  Using Teach Back, explained/ demonstrated procedure for flushing PEG.  Patient successfully verbalized and demonstrated procedure, explained purpose of daily flushes.  I provided patient syringe, Hypofix tape, and saline pending visit by Sylva.  Continuing navigation as L1 patient (new patient).  Gayleen Orem, RN, BSN, [facility] Head [facility] Neck Oncology Navigator [phone]

## 2014-05-13 NOTE — Assessment & Plan Note (Signed)
He has left shoulder pain related to recent surgery. I recommend conservative management and take pain medicine as needed.

## 2014-05-13 NOTE — Assessment & Plan Note (Signed)
This could be related to a reaction to recent anesthesia. I will monitored carefully. I have checked with the pharmacist and we would not need to reduce the dose of cisplatin as it is not cleared by liver function.

## 2014-05-13 NOTE — Progress Notes (Signed)
Nutrition followup completed with patient and wife.  Patient is status post laparoscopic G-tube placement along with Port-A-Cath.  Patient reports recent weight loss is reflective of requiring clear liquid diet for several days surrounding surgery.  Weight documented as 184.2 pounds June 29, down from 190 pounds June 26 and 187 pounds June 3.  Patient was educated on tube feeding care.  Patient able to verbalize importance of free water flushes.  Patient reports he is tolerating a soft diet secondary to dental work.  Nutrition diagnosis: Food and nutrition related knowledge deficit.  Intervention: Enforced the importance of patient consuming smaller, more frequent meals and snacks throughout the day concentrating on high-calorie, high-protein foods.  Encouraged patient to add in oral nutrition supplements as desired.  Educated patient on minimum caloric and protein requirements.  Reviewed tube feeding instructions and provided patient with samples enteral nutrition of Osmolite 1.5 so he could practice using his G-tube.  Also provided patient with samples of oral nutrition supplements and additional fact sheets.  Questions were answered.  Teach back method used.    Monitoring, evaluation, goals: Patient has had some weight loss secondary to recent surgery.  He will work to increase calories and protein in small, frequent meals and snacks.  Next visit: Wednesday, July 1, during chemotherapy.

## 2014-05-13 NOTE — Assessment & Plan Note (Addendum)
We discussed the role of chemotherapy. The intent is for cure.  We discussed some of the risks, benefits, side-effects of cisplatin. Some of the short term side-effects included, though not limited to, including weight loss, life threatening infections, risk of allergic reactions, need for transfusions of blood products, nausea, vomiting, change in bowel habits, loss of hair, admission to hospital for various reasons, and risks of death.   Long term side-effects are also discussed including risks of infertility, permanent damage to nerve function, hearing loss, chronic fatigue, kidney damage with possibility needing hemodialysis, and rare secondary malignancy including bone marrow disorders.  The patient is aware that the response rates discussed earlier is not guaranteed.  After a long discussion, patient made an informed decision to proceed with the prescribed plan of care and went ahead to sign the consent form today.   Patient education material was dispensed. Treatment decision is based on NCCN guidelines, 100 mg/m2 every 3 weeks for total of 3 days with concurrent radiation treatment. 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 Burt Knack, M.D., Lucille Passy. Georjean Mode, Ph.D., Arlene A. Jasper Loser, M.D., Blase Mess, M.D., Darrick Penna. Megan Salon, M.D., [place] Jonna Munro, M.D., Domingo Cocking. Lyndon Code, M.D., Sallye Lat, M.D., Luciano Cutter, M.D., Polly Cobia, M.D., Minerva Ends, M.D., Sandre Kitty, M.D., K.S. Mariane Duval, M.D., Trinidad Curet, M.D., Meryl Dare, M.D., and Danae Orleans. Fu, M.D. for the [facility] [employer] [facility] 2004; [id number] 8, 2004DOI: [phone]

## 2014-05-14 ENCOUNTER — Telehealth: Payer: Self-pay | Admitting: Hematology and Oncology

## 2014-05-14 DIAGNOSIS — C099 Malignant neoplasm of tonsil, unspecified: Secondary | ICD-10-CM | POA: Diagnosis not present

## 2014-05-15 ENCOUNTER — Ambulatory Visit (HOSPITAL_BASED_OUTPATIENT_CLINIC_OR_DEPARTMENT_OTHER): Payer: BC Managed Care – PPO

## 2014-05-15 ENCOUNTER — Ambulatory Visit
Admission: RE | Admit: 2014-05-15 | Discharge: 2014-05-15 | Disposition: A | Discharge status: Home / Self Care | Payer: BC Managed Care – PPO | Source: Ambulatory Visit | Attending: Radiation Oncology | Admitting: Radiation Oncology

## 2014-05-15 ENCOUNTER — Encounter: Payer: Self-pay | Admitting: *Deleted

## 2014-05-15 ENCOUNTER — Encounter: Payer: Self-pay | Admitting: Radiation Oncology

## 2014-05-15 ENCOUNTER — Ambulatory Visit: Payer: BC Managed Care – PPO | Admitting: Nutrition

## 2014-05-15 VITALS — BP 124/71 | HR 79 | Temp 97.8°F

## 2014-05-15 DIAGNOSIS — C099 Malignant neoplasm of tonsil, unspecified: Secondary | ICD-10-CM

## 2014-05-15 DIAGNOSIS — Z5111 Encounter for antineoplastic chemotherapy: Secondary | ICD-10-CM

## 2014-05-15 MED ORDER — SODIUM CHLORIDE 0.9 % IJ SOLN
10.0000 mL | INTRAMUSCULAR | Status: DC | PRN
  Administered 2014-05-15: 10 mL
  Filled 2014-05-15: qty 10

## 2014-05-15 MED ORDER — SODIUM CHLORIDE 0.9 % IV SOLN
150.0000 mg | Freq: Once | INTRAVENOUS | Status: AC
  Administered 2014-05-15: 150 mg via INTRAVENOUS
  Filled 2014-05-15: qty 5

## 2014-05-15 MED ORDER — DEXAMETHASONE SODIUM PHOSPHATE 20 MG/5ML IJ SOLN
INTRAMUSCULAR | Status: AC
  Filled 2014-05-15: qty 5

## 2014-05-15 MED ORDER — MANNITOL 25 % IV SOLN
Freq: Once | INTRAVENOUS | Status: AC
  Administered 2014-05-15: 09:00:00 via INTRAVENOUS
  Filled 2014-05-15: qty 10

## 2014-05-15 MED ORDER — DEXAMETHASONE SODIUM PHOSPHATE 20 MG/5ML IJ SOLN
12.0000 mg | Freq: Once | INTRAMUSCULAR | Status: AC
  Administered 2014-05-15: 12 mg via INTRAVENOUS

## 2014-05-15 MED ORDER — HEPARIN SOD (PORK) LOCK FLUSH 100 UNIT/ML IV SOLN
500.0000 [IU] | Freq: Once | INTRAVENOUS | Status: AC | PRN
  Administered 2014-05-15: 500 [IU]
  Filled 2014-05-15: qty 5

## 2014-05-15 MED ORDER — SODIUM CHLORIDE 0.9 % IV SOLN
100.0000 mg/m2 | Freq: Once | INTRAVENOUS | Status: AC
  Administered 2014-05-15: 200 mg via INTRAVENOUS
  Filled 2014-05-15: qty 200

## 2014-05-15 MED ORDER — PALONOSETRON HCL INJECTION 0.25 MG/5ML
INTRAVENOUS | Status: AC
  Filled 2014-05-15: qty 5

## 2014-05-15 MED ORDER — PALONOSETRON HCL INJECTION 0.25 MG/5ML
0.2500 mg | Freq: Once | INTRAVENOUS | Status: AC
  Administered 2014-05-15: 0.25 mg via INTRAVENOUS

## 2014-05-15 NOTE — Patient Instructions (Signed)
[facility] Discharge Instructions for Patients Receiving Chemotherapy  Today you received the following chemotherapy agents cisplatin.    To help prevent nausea and vomiting after your treatment, we encourage you to take your nausea medication as directed.    If you develop nausea and vomiting that is not controlled by your nausea medication, call the clinic.   BELOW ARE SYMPTOMS THAT SHOULD BE REPORTED IMMEDIATELY:  *FEVER GREATER THAN 100.5 F  *CHILLS WITH OR WITHOUT FEVER  NAUSEA AND VOMITING THAT IS NOT CONTROLLED WITH YOUR NAUSEA MEDICATION  *UNUSUAL SHORTNESS OF BREATH  *UNUSUAL BRUISING OR BLEEDING  TENDERNESS IN MOUTH AND THROAT WITH OR WITHOUT PRESENCE OF ULCERS  *URINARY PROBLEMS  *BOWEL PROBLEMS  UNUSUAL RASH Items with * indicate a potential emergency and should be followed up as soon as possible.  Cisplatin injection What is this medicine? CISPLATIN (SIS pla tin) is a chemotherapy drug. It targets fast dividing cells, like cancer cells, and causes these cells to die. This medicine is used to treat many types of cancer like bladder, ovarian, and testicular cancers. This medicine may be used for other purposes; ask your health care provider or pharmacist if you have questions. COMMON BRAND NAME(S): Platinol, Platinol -AQ What should I tell my health care provider before I take this medicine? They need to know if you have any of these conditions: -blood disorders -hearing problems -kidney disease -recent or ongoing radiation therapy -an unusual or allergic reaction to cisplatin, carboplatin, other chemotherapy, other medicines, foods, dyes, or preservatives -pregnant or trying to get pregnant -breast-feeding How should I use this medicine? This drug is given as an infusion into a vein. It is administered in a hospital or clinic by a specially trained health care professional. Talk to your pediatrician regarding the use of this medicine in  children. Special care may be needed. Overdosage: If you think you have taken too much of this medicine contact a poison control center or emergency room at once. NOTE: This medicine is only for you. Do not share this medicine with others. What if I miss a dose? It is important not to miss a dose. Call your doctor or health care professional if you are unable to keep an appointment. What may interact with this medicine? -dofetilide -foscarnet -medicines for seizures -medicines to increase blood counts like filgrastim, pegfilgrastim, sargramostim -probenecid -pyridoxine used with altretamine -rituximab -some antibiotics like amikacin, gentamicin, neomycin, polymyxin B, streptomycin, tobramycin -sulfinpyrazone -vaccines -zalcitabine Talk to your doctor or health care professional before taking any of these medicines: -acetaminophen -aspirin -ibuprofen -ketoprofen -naproxen This list may not describe all possible interactions. Give your health care provider a list of all the medicines, herbs, non-prescription drugs, or dietary supplements you use. Also tell them if you smoke, drink alcohol, or use illegal drugs. Some items may interact with your medicine. What should I watch for while using this medicine? Your condition will be monitored carefully while you are receiving this medicine. You will need important blood work done while you are taking this medicine. This drug may make you feel generally unwell. This is not uncommon, as chemotherapy can affect healthy cells as well as cancer cells. Report any side effects. Continue your course of treatment even though you feel ill unless your doctor tells you to stop. In some cases, you may be given additional medicines to help with side effects. Follow all directions for their use. Call your doctor or health care professional for advice if you get a fever,  chills or sore throat, or other symptoms of a cold or flu. Do not treat yourself. This drug  decreases your body's ability to fight infections. Try to avoid being around people who are sick. This medicine may increase your risk to bruise or bleed. Call your doctor or health care professional if you notice any unusual bleeding. Be careful brushing and flossing your teeth or using a toothpick because you may get an infection or bleed more easily. If you have any dental work done, tell your dentist you are receiving this medicine. Avoid taking products that contain aspirin, acetaminophen, ibuprofen, naproxen, or ketoprofen unless instructed by your doctor. These medicines may hide a fever. Do not become pregnant while taking this medicine. Women should inform their doctor if they wish to become pregnant or think they might be pregnant. There is a potential for serious side effects to an unborn child. Talk to your health care professional or pharmacist for more information. Do not breast-feed an infant while taking this medicine. Drink fluids as directed while you are taking this medicine. This will help protect your kidneys. Call your doctor or health care professional if you get diarrhea. Do not treat yourself. What side effects may I notice from receiving this medicine? Side effects that you should report to your doctor or health care professional as soon as possible: -allergic reactions like skin rash, itching or hives, swelling of the face, lips, or tongue -signs of infection - fever or chills, cough, sore throat, pain or difficulty passing urine -signs of decreased platelets or bleeding - bruising, pinpoint red spots on the skin, black, tarry stools, nosebleeds -signs of decreased red blood cells - unusually weak or tired, fainting spells, lightheadedness -breathing problems -changes in hearing -gout pain -low blood counts - This drug may decrease the number of white blood cells, red blood cells and platelets. You may be at increased risk for infections and bleeding. -nausea and  vomiting -pain, swelling, redness or irritation at the injection site -pain, tingling, numbness in the hands or feet -problems with balance, movement -trouble passing urine or change in the amount of urine Side effects that usually do not require medical attention (report to your doctor or health care professional if they continue or are bothersome): -changes in vision -loss of appetite -metallic taste in the mouth or changes in taste This list may not describe all possible side effects. Call your doctor for medical advice about side effects. You may report side effects to FDA at 1-800-FDA-1088. Where should I keep my medicine? This drug is given in a hospital or clinic and will not be stored at home. NOTE: This sheet is a summary. It may not cover all possible information. If you have questions about this medicine, talk to your doctor, pharmacist, or health care provider.  2015, Elsevier/Gold Standard. (2008-02-06 14:40:54)   Feel free to call the clinic you have any questions or concerns. The clinic phone number is [phone].

## 2014-05-15 NOTE — Progress Notes (Signed)
Brief followup with patient during chemotherapy.  Patient is receiving first chemotherapy today.  Patient has not had nutrition side effects.  He feels like he is eating well. He has no questions regarding nutrition education or tube feeding education.  Nutrition diagnosis: Food and nutrition related knowledge deficit improved.  Intervention: Provided support and encouragement to patient and wife regarding upcoming treatment and potential side effects.  Encouraged patient to continue small frequent meals with high protein foods.  Recommended patient continue flushing G-tube on a daily basis.  Teach back method used.  Monitoring, evaluation, goals: Patient currently is eating well.  Next visit: Wednesday, July 8, after M.D. appointment.

## 2014-05-15 NOTE — Progress Notes (Unsigned)
IMRT Device Note     delivered field widths represent one set of IMRT treatment devices. The code is [phone].  -----------------------------------  Eppie Gibson, MD

## 2014-05-16 ENCOUNTER — Ambulatory Visit
Admission: RE | Admit: 2014-05-16 | Discharge: 2014-05-16 | Disposition: A | Discharge status: Home / Self Care | Payer: BC Managed Care – PPO | Source: Ambulatory Visit | Attending: Radiation Oncology | Admitting: Radiation Oncology

## 2014-05-16 ENCOUNTER — Telehealth: Payer: Self-pay | Admitting: *Deleted

## 2014-05-16 DIAGNOSIS — C099 Malignant neoplasm of tonsil, unspecified: Secondary | ICD-10-CM | POA: Diagnosis not present

## 2014-05-16 NOTE — Telephone Encounter (Signed)
Pt left VM states constipated and asks if ok to take colace with chemo?  Spoke w/ wife while pt in Radiation Treatment.  She states pt did have a good BM this afternoon.  Informed her ok for pt to take Stool softener one to two once to twice daily as needed to prevent constipation.  Let us know if this doesn't work or have pt call back if any further questions.  She verbalized understanding.

## 2014-05-16 NOTE — Progress Notes (Signed)
To provide support, encouragement and care continuity, met with patient and his wife during first chemo and RT.  Pt reported that [place] had come by yesterday to provide PEG use/maintenance education.  I reviewed daily flushing procedure with them.  I emphasized the importance of hydration and high protein/calorie nutrition and the goal of oral consumption as long as he is able.  I explained that following one of his early RTs, they will receive RT care education from one of the [facility] RNs.  In preface, I educated them on the points of BID application of Biafine to irradiated areas and protecting his neck from direct sun when outdoors.  They verbalized understanding.  Continuing navigation as L1 patient (new patient).  Gayleen Orem, RN, BSN, [facility] Navigator [phone]

## 2014-05-16 NOTE — Telephone Encounter (Signed)
Spoke with pt today for post chemo follow up call.  Pt stated he was doing fine.  Had slight nausea, and took antiemetics as instructed by md with relief.  No vomiting.  Pt stated ok appetite, and drinks lots of fluids as tolerated.  Pt had feeding tube  insertion last Fri, and has been flushing tube daily with water.  Pt was instructed by [place], desk nurse to take stool softener BID.  Stated bladder functions fine.  Denied pain. Pt stated overall he had good experience with first chemo yesterday.  No other concerns at this time.

## 2014-05-20 ENCOUNTER — Ambulatory Visit
Admission: RE | Admit: 2014-05-20 | Discharge: 2014-05-20 | Disposition: A | Discharge status: Home / Self Care | Payer: BC Managed Care – PPO | Source: Ambulatory Visit | Attending: Radiation Oncology | Admitting: Radiation Oncology

## 2014-05-20 VITALS — BP 131/68 | HR 83 | Temp 97.5°F | Resp 14 | Wt 181.6 lb

## 2014-05-20 DIAGNOSIS — C099 Malignant neoplasm of tonsil, unspecified: Secondary | ICD-10-CM

## 2014-05-20 MED ORDER — BIAFINE EX EMUL: Freq: Two times a day (BID) | CUTANEOUS | Status: DC

## 2014-05-20 NOTE — Progress Notes (Signed)
Mr. Platner Given Biafine today with instructions to apply to skin of lower jaw and neck BID-TID only after each daily treatment. He was given the Radiation Therapy and You booklet to review with pages marked regarding External Beam irradiation, fatigue, sore mouth, sore throat(pain on swallowing), skin care and potential nausea with Cisplatin. He seemed in a hurry today, therefore, this RN requested he review the booklet and will meet with him on tomorrow to review.

## 2014-05-20 NOTE — Progress Notes (Signed)
   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  CBCT/MVCT images/Port film x-rays were reviewed.  The chart was checked. Doing well. Minimal complaints.  Physical Findings:  weight is 181 lb 9.6 oz (82.373 kg). His oral temperature is 97.5 F (36.4 C). His blood pressure is 131/68 and his pulse is 83. His respiration is 14 and oxygen saturation is 99%.  peg site intact. Oropharynx - no thrush or mucositis. Grossly visible lump in right neck CBC    Component Value Date/Time   WBC 6.0 05/13/2014 1159   WBC 8.7 05/11/2014 0518   RBC 4.99 05/13/2014 1159   RBC 4.11* 05/11/2014 0518   HGB 15.5 05/13/2014 1159   HGB 12.7* 05/11/2014 0518   HCT 46.1 05/13/2014 1159   HCT 37.3* 05/11/2014 0518   PLT 202 05/13/2014 1159   PLT 180 05/11/2014 0518   MCV 92.4 05/13/2014 1159   MCV 90.8 05/11/2014 0518   MCH 31.1 05/13/2014 1159   MCH 30.9 05/11/2014 0518   MCHC 33.6 05/13/2014 1159   MCHC 34.0 05/11/2014 0518   RDW 12.5 05/13/2014 1159   RDW 12.7 05/11/2014 0518   LYMPHSABS 2.3 05/13/2014 1159   MONOABS 0.9 05/13/2014 1159   EOSABS 0.2 05/13/2014 1159   BASOSABS 0.0 05/13/2014 1159    CMP     Component Value Date/Time   NA 141 05/13/2014 1159   NA 139 05/11/2014 0518   K 3.7 05/13/2014 1159   K 4.6 05/11/2014 0518   CL 103 05/11/2014 0518   CO2 26 05/13/2014 1159   CO2 25 05/11/2014 0518   GLUCOSE 110 05/13/2014 1159   GLUCOSE 127* 05/11/2014 0518   BUN 12.3 05/13/2014 1159   BUN 14 05/11/2014 0518   CREATININE 0.9 05/13/2014 1159   CREATININE 0.87 05/11/2014 0518   CALCIUM 9.5 05/13/2014 1159   CALCIUM 9.0 05/11/2014 0518   PROT 7.5 05/13/2014 1159   PROT 7.3 05/09/2014 1130   ALBUMIN 3.6 05/13/2014 1159   ALBUMIN 3.6 05/09/2014 1130   AST 150* 05/13/2014 1159   AST 108* 05/09/2014 1130   ALT 228* 05/13/2014 1159   ALT 180* 05/09/2014 1130   ALKPHOS 44 05/13/2014  1159   ALKPHOS 43 05/09/2014 1130   BILITOT 0.60 05/13/2014 1159   BILITOT 0.4 05/09/2014 1130   GFRNONAA >90 05/11/2014 0518   GFRAA >90 05/11/2014 0518     Impression:  The patient is tolerating radiotherapy.  Plan:  Continue radiotherapy as planned.   ________________________________   Eppie Gibson, M.D.

## 2014-05-20 NOTE — Progress Notes (Signed)
He is currently in no pain. Pt complains of Fatigue. Pt denies dysphagia. Pt reports a "hard swallow" occasionally. The patient eats a regular, healthy diet.. Oral exam reveals mucous membranes moist, pharynx normal without lesions. Noted a moderate amount of yellow/white exudate on tongue. Noted a small amount of dry desquamation on the right side of the neck. Pt reports he doesn't have any radiaplex or biafine.

## 2014-05-21 ENCOUNTER — Ambulatory Visit
Admission: RE | Admit: 2014-05-21 | Discharge: 2014-05-21 | Disposition: A | Discharge status: Home / Self Care | Payer: BC Managed Care – PPO | Source: Ambulatory Visit | Attending: Radiation Oncology | Admitting: Radiation Oncology

## 2014-05-21 DIAGNOSIS — C099 Malignant neoplasm of tonsil, unspecified: Secondary | ICD-10-CM | POA: Diagnosis not present

## 2014-05-22 ENCOUNTER — Telehealth: Payer: Self-pay | Admitting: *Deleted

## 2014-05-22 ENCOUNTER — Ambulatory Visit (HOSPITAL_BASED_OUTPATIENT_CLINIC_OR_DEPARTMENT_OTHER): Payer: BC Managed Care – PPO | Admitting: Hematology and Oncology

## 2014-05-22 ENCOUNTER — Ambulatory Visit: Payer: BC Managed Care – PPO | Admitting: Nutrition

## 2014-05-22 ENCOUNTER — Encounter: Payer: Self-pay | Admitting: *Deleted

## 2014-05-22 ENCOUNTER — Telehealth: Payer: Self-pay | Admitting: Hematology and Oncology

## 2014-05-22 ENCOUNTER — Ambulatory Visit
Admission: RE | Admit: 2014-05-22 | Discharge: 2014-05-22 | Disposition: A | Discharge status: Home / Self Care | Payer: BC Managed Care – PPO | Source: Ambulatory Visit | Attending: Radiation Oncology | Admitting: Radiation Oncology

## 2014-05-22 ENCOUNTER — Encounter: Payer: Self-pay | Admitting: Hematology and Oncology

## 2014-05-22 VITALS — BP 106/75 | HR 88 | Temp 97.1°F | Resp 18 | Ht 68.0 in | Wt 179.6 lb

## 2014-05-22 DIAGNOSIS — C099 Malignant neoplasm of tonsil, unspecified: Secondary | ICD-10-CM | POA: Diagnosis not present

## 2014-05-22 DIAGNOSIS — K9420 Gastrostomy complication, unspecified: Secondary | ICD-10-CM | POA: Insufficient documentation

## 2014-05-22 DIAGNOSIS — R634 Abnormal weight loss: Secondary | ICD-10-CM

## 2014-05-22 DIAGNOSIS — L408 Other psoriasis: Secondary | ICD-10-CM

## 2014-05-22 DIAGNOSIS — L409 Psoriasis, unspecified: Secondary | ICD-10-CM

## 2014-05-22 DIAGNOSIS — Z931 Gastrostomy status: Secondary | ICD-10-CM | POA: Insufficient documentation

## 2014-05-22 DIAGNOSIS — R7401 Elevation of levels of liver transaminase levels: Secondary | ICD-10-CM

## 2014-05-22 DIAGNOSIS — R740 Nonspecific elevation of levels of transaminase and lactic acid dehydrogenase [LDH]: Secondary | ICD-10-CM

## 2014-05-22 DIAGNOSIS — K9423 Gastrostomy malfunction: Secondary | ICD-10-CM

## 2014-05-22 NOTE — Progress Notes (Signed)
To provide support, encouragement and care continuity, met with patient and his wife s/p daily RT and during est pt appt with Dr. Alvy Bimler.  Re-educated patient on use of Zofran and Compazine; he verbalized understanding.   Continuing navigation as L1 patient (new patient).  Gayleen Orem, RN, BSN, [facility] Neck On[facility] Navigator [phone]

## 2014-05-22 NOTE — Telephone Encounter (Signed)
Per staff message and POF I have scheduled appts. Advised scheduler of appts. JMW  

## 2014-05-22 NOTE — Assessment & Plan Note (Signed)
This could be related to a reaction to recent anesthesia. I will monitored carefully.

## 2014-05-22 NOTE — Assessment & Plan Note (Signed)
He tolerated treatment well. Continue supportive care

## 2014-05-22 NOTE — Assessment & Plan Note (Signed)
The suture sites appeared infected. I recommend he calls general surgery for urgent evaluation

## 2014-05-22 NOTE — Assessment & Plan Note (Signed)
This is stable Continue close observation 

## 2014-05-22 NOTE — Progress Notes (Signed)
Nutrition followup completed with patient and wife.  Weight has decreased and was documented as 179.6 pounds July 8 decreased from usual body weight of 187 pounds reflecting 4% weight loss.  Patient complains about his feeding tube.  He is flushing this with water daily.  However, he has not begun to use it for enteral nutrition supplements.  Dietary recall reveals patient is eating approximately 1200 calories, and 50 g of protein, which is about 50% of estimated nutrition needs.  He has not been drinking oral nutrition supplements.  Patient reports he is drinking so much water he is unable to increase calories.  Nutrition diagnosis: Food and nutrition related knowledge deficit continues.  Intervention: Patient educated to begin Ensure Plus, boost plus or Carnation breakfast essentials 3 times a day between meals.  Recommended patient consume meals and snacks and then drink water.  Reviewed ways to add calories to foods.  Encouraged protein at every meal and snack.  Reviewed importance of adequate nutrition to minimize further weight loss.  Educated on beginning tube feedings if patient unable to increase oral intake.  Monitoring, evaluation, goals: Patient has been unable to increase oral intake and has lost 4% of his usual body weight.  Next visit: Thursday, July 16.    **Disclaimer: This note was dictated with voice recognition software. Similar sounding words can inadvertently be transcribed and this note may contain transcription errors which may not have been corrected upon publication of note.**

## 2014-05-22 NOTE — Telephone Encounter (Signed)
Gave pt appt for labs and MD, emailed Sharyn Lull regarding chemo

## 2014-05-22 NOTE — Assessment & Plan Note (Signed)
I recommend increasing nutritional supplements in between meals.

## 2014-05-22 NOTE — Progress Notes (Signed)
[place] OFFICE PROGRESS NOTE  Patient Care Team: Gavin Pound, MD as PCP - General (Family Medicine) Brooks Sailors, RN as Oncology Nurse Navigator (Oncology)  SUMMARY OF ONCOLOGIC HISTORY: Oncology History   Tonsil cancer, Right, HPV positive,    Primary site: Pharynx - Oropharynx   Staging method: AJCC 7th Edition   Clinical: Stage IVA (T1, N2, M0) signed by Heath Lark, MD on 04/17/2014  1:04 PM   Summary: Stage IVA (T1, N2, M0)       Tonsil cancer   03/20/2014 Imaging Ct scan of neck showed several complex solid and cystic lesions within the right neck and abnormalities in the pharynx   03/25/2014 Procedure 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 Multiple hypermetabolic right level 2 lymph nodes compatible with metastatic adenopathy   05/10/2014 Surgery The patient has placement of port and feeding tube.   05/15/2014 -  Chemotherapy He received high dose cisplatin every 3 weeks   05/15/2014 -  Radiation Therapy He received radiation treatment    INTERVAL HISTORY: Please see below for problem oriented charting. He had lost some weight recently due to early satiety.  The patient denies any mouth sores, nausea, vomiting or change in bowel habits He denies any recent fever, chills, night sweats or abnormal weight loss He complained of mild drainage and redness at suture sites around his feeding tube and had difficulties using his feeding tube recently.  REVIEW OF SYSTEMS:   Constitutional: Denies fevers, chills  Eyes: Denies blurriness of vision Ears, nose, mouth, throat, and face: Denies mucositis or sore throat Respiratory: Denies cough, dyspnea or wheezes Cardiovascular: Denies palpitation, chest discomfort or lower extremity swelling Gastrointestinal:  Denies  nausea, heartburn or change in bowel habits Skin: Denies abnormal skin rashes Lymphatics: Denies new lymphadenopathy or easy bruising Neurological:Denies numbness, tingling or new weaknesses Behavioral/Psych: Mood is stable, no new changes  All other systems were reviewed with the patient and are negative.  I have reviewed the past medical history, past surgical history, social history and family history with the patient and they are unchanged from previous note.  ALLERGIES:  is allergic to sulfa antibiotics.  MEDICATIONS:  Current Outpatient Prescriptions  Medication Sig Dispense Refill  . lidocaine-prilocaine (EMLA) cream Apply 1 application topically as needed. Apply to [facility] a Cath site one hour prior to needle stick.  30 g  3  . ondansetron (ZOFRAN) 8 MG tablet Take 1 tablet (8 mg total) by mouth every 8 (eight) hours as needed for nausea or vomiting. Start on the third day after chemotherapy.  30 tablet  1  . prochlorperazine (COMPAZINE) 10 MG tablet Take 1 tablet (10 mg total) by mouth every 6 (six) hours as needed (Nausea or vomiting).  30 tablet  1  . sodium fluoride (FLUORISHIELD) 1.1 % GEL dental gel Instill one drop of fluoride per tooth space of fluoride tray. Place over teeth for 5 minutes. Remove. Spit out excess. Repeat nightly.  120 mL  prn  . tobramycin (TOBREX) 0.3 % ophthalmic solution Place 1 drop into both eyes every 4 (four) hours.  5 mL  0  . clobetasol cream (TEMOVATE) 9.73 % Apply 1 application topically daily as needed (Psorasis).        No current facility-administered medications for this visit.    PHYSICAL EXAMINATION: ECOG PERFORMANCE  STATUS: 1 - Symptomatic but completely ambulatory  Filed Vitals:   05/22/14 1402  BP: 106/75  Pulse: 88  Temp: 97.1 F (36.2 C)  Resp: 18   Filed Weights   05/22/14 1402  Weight: 179 lb 9.6 oz (81.466 kg)    GENERAL:alert, no distress and comfortable SKIN: skin color, texture, turgor are normal, no rashes or  significant lesions. Noted psoriatic plaques unchanged. EYES: normal, Conjunctiva are pink and non-injected, sclera clear OROPHARYNX:no exudate, no erythema and lips, buccal mucosa, and tongue normal  NECK: supple, thyroid normal size, non-tender, without nodularity LYMPH:  persistent palpable lymphadenopathy in the neck, unchanged LUNGS: clear to auscultation and percussion with normal breathing effort HEART: regular rate & rhythm and no murmurs and no lower extremity edema ABDOMEN:abdomen soft, non-tender and normal bowel sounds. There is mild erythema around his feeding tube site without signs of cellulitis. Musculoskeletal:no cyanosis of digits and no clubbing  NEURO: alert & oriented x 3 with fluent speech, no focal motor/sensory deficits  LABORATORY DATA:  I have reviewed the data as listed    Component Value Date/Time   NA 141 05/13/2014 1159   NA 139 05/11/2014 0518   K 3.7 05/13/2014 1159   K 4.6 05/11/2014 0518   CL 103 05/11/2014 0518   CO2 26 05/13/2014 1159   CO2 25 05/11/2014 0518   GLUCOSE 110 05/13/2014 1159   GLUCOSE 127* 05/11/2014 0518   BUN 12.3 05/13/2014 1159   BUN 14 05/11/2014 0518   CREATININE 0.9 05/13/2014 1159   CREATININE 0.87 05/11/2014 0518   CALCIUM 9.5 05/13/2014 1159   CALCIUM 9.0 05/11/2014 0518   PROT 7.5 05/13/2014 1159   PROT 7.3 05/09/2014 1130   ALBUMIN 3.6 05/13/2014 1159   ALBUMIN 3.6 05/09/2014 1130   AST 150* 05/13/2014 1159   AST 108* 05/09/2014 1130   ALT 228* 05/13/2014 1159   ALT 180* 05/09/2014 1130   ALKPHOS 44 05/13/2014 1159   ALKPHOS 43 05/09/2014 1130   BILITOT 0.60 05/13/2014 1159   BILITOT 0.4 05/09/2014 1130   GFRNONAA >90 05/11/2014 0518   GFRAA >90 05/11/2014 0518    No results found for this basename: SPEP, UPEP,  kappa and lambda light chains    Lab Results  Component Value Date   WBC 6.0 05/13/2014   NEUTROABS 2.6 05/13/2014   HGB 15.5 05/13/2014   HCT 46.1 05/13/2014   MCV 92.4 05/13/2014   PLT 202 05/13/2014      Chemistry       Component Value Date/Time   NA 141 05/13/2014 1159   NA 139 05/11/2014 0518   K 3.7 05/13/2014 1159   K 4.6 05/11/2014 0518   CL 103 05/11/2014 0518   CO2 26 05/13/2014 1159   CO2 25 05/11/2014 0518   BUN 12.3 05/13/2014 1159   BUN 14 05/11/2014 0518   CREATININE 0.9 05/13/2014 1159   CREATININE 0.87 05/11/2014 0518      Component Value Date/Time   CALCIUM 9.5 05/13/2014 1159   CALCIUM 9.0 05/11/2014 0518   ALKPHOS 44 05/13/2014 1159   ALKPHOS 43 05/09/2014 1130   AST 150* 05/13/2014 1159   AST 108* 05/09/2014 1130   ALT 228* 05/13/2014 1159   ALT 180* 05/09/2014 1130   BILITOT 0.60 05/13/2014 1159   BILITOT 0.4 05/09/2014 1130     ASSESSMENT & PLAN:  Tonsil cancer He tolerated treatment well. Continue supportive care  Psoriasis This is stable. Continue close observation  Elevated transaminase level This could be  related to a reaction to recent anesthesia. I will monitored carefully.  Complication of feeding tube The suture sites appeared infected. I recommend he calls general surgery for urgent evaluation  Unintentional weight loss I recommend increasing nutritional supplements in between meals.   No orders of the defined types were placed in this encounter.   All questions were answered. The patient knows to call the clinic with any problems, questions or concerns. No barriers to learning was detected. I spent 25 minutes counseling the patient face to face. The total time spent in the appointment was 30 minutes and more than 50% was on counseling and review of test results     [facility], [place], MD 05/22/2014 7:57 PM

## 2014-05-23 ENCOUNTER — Encounter (INDEPENDENT_AMBULATORY_CARE_PROVIDER_SITE_OTHER): Payer: Self-pay | Admitting: General Surgery

## 2014-05-23 ENCOUNTER — Ambulatory Visit (INDEPENDENT_AMBULATORY_CARE_PROVIDER_SITE_OTHER): Payer: BC Managed Care – PPO | Admitting: General Surgery

## 2014-05-23 ENCOUNTER — Ambulatory Visit
Admission: RE | Admit: 2014-05-23 | Discharge: 2014-05-23 | Disposition: A | Discharge status: Home / Self Care | Payer: BC Managed Care – PPO | Source: Ambulatory Visit | Attending: Radiation Oncology | Admitting: Radiation Oncology

## 2014-05-23 VITALS — BP 114/78 | HR 72 | Temp 98.7°F | Resp 16 | Ht 68.0 in | Wt 181.6 lb

## 2014-05-23 DIAGNOSIS — Z09 Encounter for follow-up examination after completed treatment for conditions other than malignant neoplasm: Secondary | ICD-10-CM

## 2014-05-23 DIAGNOSIS — C099 Malignant neoplasm of tonsil, unspecified: Secondary | ICD-10-CM | POA: Diagnosis not present

## 2014-05-23 NOTE — Progress Notes (Signed)
Subjective:     Patient ID: Christopher Mullins, male   DOB: [birth year], 58 y.o.   MRN: [id number]  HPI Patient status post laparoscopic gastrostomy tube placement by Dr. Hassell Done. He developed some redness around his G-tube site and comes to urgent office. He is having some difficulties with the cork as well. This seems to be due to the design of the tube.  Review of Systems     Objective:   Physical Exam Abdomen is soft, there is inflammation at the suture sites at each suture around the flange to the gastrostomy tube. Some of the sutures had pulled out of the tube flange as well. All the sutures were removed. Close inspection reveals no significant infection or drainage from underneath. A new dressing sponge was applied.    Assessment:     Suture related inflammation and G-tube site, no acute infection    Plan:     Sutures were removed. Tube care was discussed. He will apply some Neosporin as well and clean it twice a day for the next 2 days. After that he'll resume once a day to care. Return as needed.

## 2014-05-24 ENCOUNTER — Ambulatory Visit
Admission: RE | Admit: 2014-05-24 | Discharge: 2014-05-24 | Disposition: A | Discharge status: Home / Self Care | Payer: BC Managed Care – PPO | Source: Ambulatory Visit | Attending: Radiation Oncology | Admitting: Radiation Oncology

## 2014-05-24 DIAGNOSIS — C099 Malignant neoplasm of tonsil, unspecified: Secondary | ICD-10-CM | POA: Diagnosis not present

## 2014-05-27 ENCOUNTER — Ambulatory Visit
Admission: RE | Admit: 2014-05-27 | Discharge: 2014-05-27 | Disposition: A | Discharge status: Home / Self Care | Payer: BC Managed Care – PPO | Source: Ambulatory Visit | Attending: Radiation Oncology | Admitting: Radiation Oncology

## 2014-05-27 ENCOUNTER — Encounter: Payer: Self-pay | Admitting: Radiation Oncology

## 2014-05-27 ENCOUNTER — Telehealth: Payer: Self-pay | Admitting: *Deleted

## 2014-05-27 VITALS — BP 115/65 | HR 79 | Temp 98.3°F | Ht 68.0 in | Wt 182.4 lb

## 2014-05-27 DIAGNOSIS — C099 Malignant neoplasm of tonsil, unspecified: Secondary | ICD-10-CM

## 2014-05-27 MED ORDER — MAGIC MOUTHWASH W/LIDOCAINE: ORAL | Status: DC

## 2014-05-27 NOTE — Progress Notes (Signed)
   Weekly Management Note:  Outpatient Current Dose:  16 Gy  Projected Dose: 70 Gy    ICD-9-CM   1. Tonsil cancer 146.0 Alum & Mag Hydroxide-Simeth (MAGIC MOUTHWASH W/LIDOCAINE) SOLN    Narrative:  The patient presents for routine under treatment assessment.  CBCT/MVCT images/Port film x-rays were reviewed.  The chart was checked. He is doing relatively well, with good energy, biked 10 miles this weekend.  No severe nausea. Weight increased.  Physical Findings:  height is 5\' 8"  (1.727 m) and weight is 182 lb 6.4 oz (82.736 kg). His temperature is 98.3 F (36.8 C). His blood pressure is 115/65 and his pulse is 79.  NAD, oropharynx clear. Palpable right neck mass is stable.  CBC    Component Value Date/Time   WBC 6.0 05/13/2014 1159   WBC 8.7 05/11/2014 0518   RBC 4.99 05/13/2014 1159   RBC 4.11* 05/11/2014 0518   HGB 15.5 05/13/2014 1159   HGB 12.7* 05/11/2014 0518   HCT 46.1 05/13/2014 1159   HCT 37.3* 05/11/2014 0518   PLT 202 05/13/2014 1159   PLT 180 05/11/2014 0518   MCV 92.4 05/13/2014 1159   MCV 90.8 05/11/2014 0518   MCH 31.1 05/13/2014 1159   MCH 30.9 05/11/2014 0518   MCHC 33.6 05/13/2014 1159   MCHC 34.0 05/11/2014 0518   RDW 12.5 05/13/2014 1159   RDW 12.7 05/11/2014 0518   LYMPHSABS 2.3 05/13/2014 1159   MONOABS 0.9 05/13/2014 1159   EOSABS 0.2 05/13/2014 1159   BASOSABS 0.0 05/13/2014 1159     CMP     Component Value Date/Time   NA 141 05/13/2014 1159   NA 139 05/11/2014 0518   K 3.7 05/13/2014 1159   K 4.6 05/11/2014 0518   CL 103 05/11/2014 0518   CO2 26 05/13/2014 1159   CO2 25 05/11/2014 0518   GLUCOSE 110 05/13/2014 1159   GLUCOSE 127* 05/11/2014 0518   BUN 12.3 05/13/2014 1159   BUN 14 05/11/2014 0518   CREATININE 0.9 05/13/2014 1159   CREATININE 0.87 05/11/2014 0518   CALCIUM 9.5 05/13/2014 1159   CALCIUM 9.0 05/11/2014 0518   PROT 7.5 05/13/2014 1159   PROT 7.3 05/09/2014 1130   ALBUMIN 3.6 05/13/2014 1159   ALBUMIN 3.6 05/09/2014 1130   AST 150* 05/13/2014 1159   AST 108*  05/09/2014 1130   ALT 228* 05/13/2014 1159   ALT 180* 05/09/2014 1130   ALKPHOS 44 05/13/2014 1159   ALKPHOS 43 05/09/2014 1130   BILITOT 0.60 05/13/2014 1159   BILITOT 0.4 05/09/2014 1130   GFRNONAA >90 05/11/2014 0518   GFRAA >90 05/11/2014 0518     Impression:  The patient is tolerating radiotherapy.   Plan:  Continue radiotherapy as planned. Continue baking soda gargle. MMW Rx'd for future odynophagia.  -----------------------------------  Eppie Gibson, MD

## 2014-05-27 NOTE — Progress Notes (Signed)
Christopher Mullins has received 8 fractions to his right tonsil and bilateral neck.  He does not have any skin irritation, nor any difficulty or pain on swallowing.  He states his energy is "pretty close to normal", and went for a 10 mile bike run on this weekend.

## 2014-05-27 NOTE — Progress Notes (Signed)
IMRT Device Note  10.1 delivered field widths represent one set of IMRT treatment devices. The code is 77338.  -----------------------------------  Osha Errico, MD 

## 2014-05-27 NOTE — Telephone Encounter (Signed)
Christopher Mullins, from [facility], called as requested. She stated that the pharmacy will give Christopher Mullins the Lidocaine for his Magic Mouthwash and he will be instructed how to add water at home.  She stated if they mixed it at the pharmacy, he will be charged for a compounding fee and they would like to decrease the cost for him.  Thanked her for their patient focused care.

## 2014-05-28 ENCOUNTER — Ambulatory Visit
Admission: RE | Admit: 2014-05-28 | Discharge: 2014-05-28 | Disposition: A | Discharge status: Home / Self Care | Payer: BC Managed Care – PPO | Source: Ambulatory Visit | Attending: Radiation Oncology | Admitting: Radiation Oncology

## 2014-05-28 DIAGNOSIS — C099 Malignant neoplasm of tonsil, unspecified: Secondary | ICD-10-CM | POA: Diagnosis not present

## 2014-05-29 ENCOUNTER — Ambulatory Visit
Admission: RE | Admit: 2014-05-29 | Discharge: 2014-05-29 | Disposition: A | Discharge status: Home / Self Care | Payer: BC Managed Care – PPO | Source: Ambulatory Visit | Attending: Radiation Oncology | Admitting: Radiation Oncology

## 2014-05-29 DIAGNOSIS — C099 Malignant neoplasm of tonsil, unspecified: Secondary | ICD-10-CM | POA: Diagnosis not present

## 2014-05-30 ENCOUNTER — Encounter: Payer: Self-pay | Admitting: Hematology and Oncology

## 2014-05-30 ENCOUNTER — Encounter: Payer: Self-pay | Admitting: *Deleted

## 2014-05-30 ENCOUNTER — Ambulatory Visit
Admission: RE | Admit: 2014-05-30 | Discharge: 2014-05-30 | Disposition: A | Discharge status: Home / Self Care | Payer: BC Managed Care – PPO | Source: Ambulatory Visit | Attending: Radiation Oncology | Admitting: Radiation Oncology

## 2014-05-30 ENCOUNTER — Ambulatory Visit: Payer: BC Managed Care – PPO | Admitting: Nutrition

## 2014-05-30 ENCOUNTER — Ambulatory Visit (HOSPITAL_BASED_OUTPATIENT_CLINIC_OR_DEPARTMENT_OTHER): Payer: BC Managed Care – PPO | Admitting: Hematology and Oncology

## 2014-05-30 VITALS — BP 119/62 | HR 93 | Temp 98.2°F | Resp 18 | Ht 68.0 in | Wt 179.6 lb

## 2014-05-30 DIAGNOSIS — K5900 Constipation, unspecified: Secondary | ICD-10-CM

## 2014-05-30 DIAGNOSIS — R21 Rash and other nonspecific skin eruption: Secondary | ICD-10-CM | POA: Insufficient documentation

## 2014-05-30 DIAGNOSIS — R070 Pain in throat: Secondary | ICD-10-CM

## 2014-05-30 DIAGNOSIS — K5909 Other constipation: Secondary | ICD-10-CM

## 2014-05-30 DIAGNOSIS — C099 Malignant neoplasm of tonsil, unspecified: Secondary | ICD-10-CM | POA: Diagnosis not present

## 2014-05-30 HISTORY — DX: Pain in throat: R07.0

## 2014-05-30 HISTORY — DX: Constipation, unspecified: K59.00

## 2014-05-30 MED ORDER — MORPHINE SULFATE (CONCENTRATE) 20 MG/ML PO SOLN: 10.0000 mg | ORAL | Status: DC | PRN

## 2014-05-30 MED ORDER — POLYETHYLENE GLYCOL 3350 17 G PO PACK: 17.0000 g | PACK | Freq: Every day | ORAL | Status: DC

## 2014-05-30 NOTE — Progress Notes (Signed)
Nutrition followup completed with patient and wife.  Weight is stable and was documented as 179.6 pounds July 16.  Patient has been using http://vang.com/ to document caloric intake.  Patient is consuming between 2700 and 2800 calories daily.  This is twice the amount he was consuming last week.  Dietary recall reveals patient is consuming 3 milkshakes daily made with whole milk, and Carnation breakfast essentials.  He reports his throat is beginning to become sore.  He has lidocaine prescribed.  Nutrition diagnosis: Food and nutrition related knowledge deficit continues but has improved.  Intervention: Recommended patient continue to consume Carnation breakfast essentials shakes 3 times a day between meals along with high-calorie, high-protein meals and snacks.  Recommended patient continue to document oral intake.  Teach back method used.  Monitoring, evaluation, goals: Patient has increased his oral intake and has been able to maintain his weight the past week.  Next visit: Wednesday, July 22, during chemotherapy.   **Disclaimer: This note was dictated with voice recognition software. Similar sounding words can inadvertently be transcribed and this note may contain transcription errors which may not have been corrected upon publication of note.**

## 2014-05-30 NOTE — Assessment & Plan Note (Signed)
This is exacerbated by recent treatment side-effects I recommend him to use laxatives regularly.

## 2014-05-30 NOTE — Assessment & Plan Note (Signed)
I recommended he use Magic mouthwash. I gave him prescription for liquid morphine to take as needed if the pain is not controlled with Magic mouthwash.

## 2014-05-30 NOTE — Assessment & Plan Note (Signed)
He tolerated treatment well apart from some minor side effects. Continue supportive care

## 2014-05-30 NOTE — Assessment & Plan Note (Signed)
This is due to radiation induced skin injury. I recommended topical emollient cream

## 2014-05-30 NOTE — Progress Notes (Signed)
To provide support, encouragement and care continuity, met with pt and his wife during est pt appt with Dr. Alvy Bimler.  Using Teach Back, reviewed Biafine application, protection from sun, use of feeding tube at point oral intake too difficult to maintain hydration/nutrition.  Continuing navigation as L1 patient (new patient).  Gayleen Orem, RN, BSN, [facility]ck Oncology Navigator [phone]

## 2014-05-30 NOTE — Progress Notes (Signed)
[place] OFFICE PROGRESS NOTE  Patient Care Team: Gavin Pound, MD as PCP - General (Family Medicine) Brooks Sailors, RN as Oncology Nurse Navigator (Oncology)  SUMMARY OF ONCOLOGIC HISTORY: Oncology History   Tonsil cancer, Right, HPV positive,    Primary site: Pharynx - Oropharynx   Staging method: AJCC 7th Edition   Clinical: Stage IVA (T1, N2, M0) signed by Heath Lark, MD on 04/17/2014  1:04 PM   Summary: Stage IVA (T1, N2, M0)       Tonsil cancer   03/20/2014 Imaging Ct scan of neck showed several complex solid and cystic lesions within the right neck and abnormalities in the pharynx   03/25/2014 Procedure 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 Multiple hypermetabolic right level 2 lymph nodes compatible with metastatic adenopathy   05/10/2014 Surgery The patient has placement of port and feeding tube.   05/15/2014 -  Chemotherapy He received high dose cisplatin every 3 weeks   05/15/2014 -  Radiation Therapy He received radiation treatment    INTERVAL HISTORY: Please see below for problem oriented charting. He said that to experience worsening of throat pain. He noted new skin rash around his neck. He is rating his pain at 3-4/10 pain. He has not started using prescription medicine for this pain. He denies recent nausea. He is mildly constipated. The mild skin infection around the suture sites of the feeding tube has resolved.  REVIEW OF SYSTEMS:   Constitutional: Denies fevers, chills or abnormal weight loss Eyes: Denies blurriness of vision Respiratory: Denies cough, dyspnea or wheezes Cardiovascular: Denies palpitation, chest discomfort or lower extremity swelling Lymphatics: Denies new lymphadenopathy or easy bruising Neurological:Denies numbness, tingling  or new weaknesses Behavioral/Psych: Mood is stable, no new changes  All other systems were reviewed with the patient and are negative.  I have reviewed the past medical history, past surgical history, social history and family history with the patient and they are unchanged from previous note.  ALLERGIES:  is allergic to sulfa antibiotics.  MEDICATIONS:  Current Outpatient Prescriptions  Medication Sig Dispense Refill  . Alum & Mag Hydroxide-Simeth (MAGIC MOUTHWASH W/LIDOCAINE) SOLN 1 part 2% viscous lidocaine, 1 part H20. Swish and/or swallow 10 mL 30 min before meals and bedtime, up to QID.  480 mL  5  . clobetasol cream (TEMOVATE) 2.54 % Apply 1 application topically daily as needed (Psorasis).       Marland Kitchen lidocaine-prilocaine (EMLA) cream Apply 1 application topically as needed. Apply to [facility] a Cath site one hour prior to needle stick.  30 g  3  . morphine (ROXANOL) 20 MG/ML concentrated solution Take 0.5 mLs (10 mg total) by mouth every 2 (two) hours as needed for severe pain.  120 mL  0  . ondansetron (ZOFRAN) 8 MG tablet Take 1 tablet (8 mg total) by mouth every 8 (eight) hours as needed for nausea or vomiting. Start on the third day after chemotherapy.  30 tablet  1  . polyethylene glycol (MIRALAX) packet Take 17 g by mouth daily.  14 each  0  . prochlorperazine (COMPAZINE) 10 MG tablet Take 1 tablet (10 mg total) by mouth every 6 (six) hours as needed (Nausea or vomiting).  30 tablet  1  . sodium fluoride (FLUORISHIELD) 1.1 % GEL dental gel Instill one drop of fluoride per tooth  space of fluoride tray. Place over teeth for 5 minutes. Remove. Spit out excess. Repeat nightly.  120 mL  prn  . tobramycin (TOBREX) 0.3 % ophthalmic solution Place 1 drop into both eyes every 4 (four) hours.  5 mL  0   No current facility-administered medications for this visit.    PHYSICAL EXAMINATION: ECOG PERFORMANCE STATUS: 1 - Symptomatic but completely ambulatory  Filed Vitals:   05/30/14 1208  BP:  119/62  Pulse: 93  Temp: 98.2 F (36.8 C)  Resp: 18   Filed Weights   05/30/14 1208  Weight: 179 lb 9.6 oz (81.466 kg)    GENERAL:alert, no distress and comfortable SKIN: Noted mild skin rash around his neck. No ulceration. The psoriatic plaques are unchanged. EYES: normal, Conjunctiva are pink and non-injected, sclera clear OROPHARYNX:no exudate, no erythema and lips, buccal mucosa, and tongue normal . No evidence of mucositis or thrush. NECK: supple, thyroid normal size, non-tender, without nodularity LYMPH:  System palpable lymphadenopathy on the right side of the neck is noted.  LUNGS: clear to auscultation and percussion with normal breathing effort HEART: regular rate & rhythm and no murmurs and no lower extremity edema ABDOMEN:abdomen soft, non-tender and normal bowel sounds. The feeding tube site looks okay. Musculoskeletal:no cyanosis of digits and no clubbing  NEURO: alert & oriented x 3 with fluent speech, no focal motor/sensory deficits  LABORATORY DATA:  I have reviewed the data as listed    Component Value Date/Time   NA 141 05/13/2014 1159   NA 139 05/11/2014 0518   K 3.7 05/13/2014 1159   K 4.6 05/11/2014 0518   CL 103 05/11/2014 0518   CO2 26 05/13/2014 1159   CO2 25 05/11/2014 0518   GLUCOSE 110 05/13/2014 1159   GLUCOSE 127* 05/11/2014 0518   BUN 12.3 05/13/2014 1159   BUN 14 05/11/2014 0518   CREATININE 0.9 05/13/2014 1159   CREATININE 0.87 05/11/2014 0518   CALCIUM 9.5 05/13/2014 1159   CALCIUM 9.0 05/11/2014 0518   PROT 7.5 05/13/2014 1159   PROT 7.3 05/09/2014 1130   ALBUMIN 3.6 05/13/2014 1159   ALBUMIN 3.6 05/09/2014 1130   AST 150* 05/13/2014 1159   AST 108* 05/09/2014 1130   ALT 228* 05/13/2014 1159   ALT 180* 05/09/2014 1130   ALKPHOS 44 05/13/2014 1159   ALKPHOS 43 05/09/2014 1130   BILITOT 0.60 05/13/2014 1159   BILITOT 0.4 05/09/2014 1130   GFRNONAA >90 05/11/2014 0518   GFRAA >90 05/11/2014 0518    No results found for this basename: SPEP, UPEP,  kappa and  lambda light chains    Lab Results  Component Value Date   WBC 6.0 05/13/2014   NEUTROABS 2.6 05/13/2014   HGB 15.5 05/13/2014   HCT 46.1 05/13/2014   MCV 92.4 05/13/2014   PLT 202 05/13/2014      Chemistry      Component Value Date/Time   NA 141 05/13/2014 1159   NA 139 05/11/2014 0518   K 3.7 05/13/2014 1159   K 4.6 05/11/2014 0518   CL 103 05/11/2014 0518   CO2 26 05/13/2014 1159   CO2 25 05/11/2014 0518   BUN 12.3 05/13/2014 1159   BUN 14 05/11/2014 0518   CREATININE 0.9 05/13/2014 1159   CREATININE 0.87 05/11/2014 0518      Component Value Date/Time   CALCIUM 9.5 05/13/2014 1159   CALCIUM 9.0 05/11/2014 0518   ALKPHOS 44 05/13/2014 1159   ALKPHOS 43 05/09/2014 1130   AST 150*  05/13/2014 1159   AST 108* 05/09/2014 1130   ALT 228* 05/13/2014 1159   ALT 180* 05/09/2014 1130   BILITOT 0.60 05/13/2014 1159   BILITOT 0.4 05/09/2014 1130      ASSESSMENT & PLAN:  Tonsil cancer He tolerated treatment well apart from some minor side effects. Continue supportive care    Throat pain I recommended he use Magic mouthwash. I gave him prescription for liquid morphine to take as needed if the pain is not controlled with Magic mouthwash.  Rash This is due to radiation induced skin injury. I recommended topical emollient cream  Constipation This is exacerbated by recent treatment side-effects I recommend him to use laxatives regularly.   No orders of the defined types were placed in this encounter.   All questions were answered. The patient knows to call the clinic with any problems, questions or concerns. No barriers to learning was detected. I spent 25 minutes counseling the patient face to face. The total time spent in the appointment was 30 minutes and more than 50% was on counseling and review of test results     [facility], [place], MD 05/30/2014 8:41 PM

## 2014-05-31 ENCOUNTER — Ambulatory Visit
Admission: RE | Admit: 2014-05-31 | Discharge: 2014-05-31 | Disposition: A | Discharge status: Home / Self Care | Payer: BC Managed Care – PPO | Source: Ambulatory Visit | Attending: Radiation Oncology | Admitting: Radiation Oncology

## 2014-05-31 ENCOUNTER — Telehealth: Payer: Self-pay | Admitting: Hematology and Oncology

## 2014-05-31 DIAGNOSIS — C099 Malignant neoplasm of tonsil, unspecified: Secondary | ICD-10-CM | POA: Diagnosis not present

## 2014-05-31 NOTE — Telephone Encounter (Signed)
s.w. pt and advised on July appt.....pt ok adn aware °

## 2014-06-03 ENCOUNTER — Ambulatory Visit (HOSPITAL_COMMUNITY): Payer: Medicaid - Dental | Admitting: Dentistry

## 2014-06-03 ENCOUNTER — Encounter: Payer: Self-pay | Admitting: Radiation Oncology

## 2014-06-03 ENCOUNTER — Encounter (HOSPITAL_COMMUNITY): Payer: Self-pay | Admitting: Dentistry

## 2014-06-03 ENCOUNTER — Ambulatory Visit
Admission: RE | Admit: 2014-06-03 | Discharge: 2014-06-03 | Disposition: A | Discharge status: Home / Self Care | Payer: BC Managed Care – PPO | Source: Ambulatory Visit | Attending: Radiation Oncology | Admitting: Radiation Oncology

## 2014-06-03 VITALS — BP 108/74 | HR 87 | Temp 98.5°F | Wt 180.0 lb

## 2014-06-03 VITALS — BP 109/71 | HR 101 | Temp 98.6°F | Ht 68.0 in | Wt 179.1 lb

## 2014-06-03 DIAGNOSIS — C099 Malignant neoplasm of tonsil, unspecified: Secondary | ICD-10-CM

## 2014-06-03 DIAGNOSIS — B370 Candidal stomatitis: Secondary | ICD-10-CM

## 2014-06-03 DIAGNOSIS — Z0189 Encounter for other specified special examinations: Secondary | ICD-10-CM

## 2014-06-03 DIAGNOSIS — R439 Unspecified disturbances of smell and taste: Secondary | ICD-10-CM

## 2014-06-03 DIAGNOSIS — R682 Dry mouth, unspecified: Secondary | ICD-10-CM

## 2014-06-03 DIAGNOSIS — K117 Disturbances of salivary secretion: Secondary | ICD-10-CM

## 2014-06-03 DIAGNOSIS — K1233 Oral mucositis (ulcerative) due to radiation: Secondary | ICD-10-CM

## 2014-06-03 DIAGNOSIS — R1310 Dysphagia, unspecified: Secondary | ICD-10-CM

## 2014-06-03 DIAGNOSIS — R634 Abnormal weight loss: Secondary | ICD-10-CM

## 2014-06-03 DIAGNOSIS — R432 Parageusia: Secondary | ICD-10-CM

## 2014-06-03 DIAGNOSIS — M264 Malocclusion, unspecified: Secondary | ICD-10-CM

## 2014-06-03 MED ORDER — FLUCONAZOLE 100 MG PO TABS: ORAL_TABLET | ORAL | Status: DC

## 2014-06-03 NOTE — Progress Notes (Signed)
Christopher Mullins has received 13 fractions to the right tonsil and bilateral neck.  C/o level 8 pain in his throat.  Note whitish, cheesy like area in the hard palate region and his tongue with redness of oral mucosa.  Skin on his neck is slightly red with intact skin.  !st chemo on July 1st and will received 2nd cycle on Wed. of this week.  He reports that he is sleeping more and is continuing to walk at least 2 times weekly.

## 2014-06-03 NOTE — Patient Instructions (Addendum)
RECOMMENDATIONS: 1. Brush after meals and at bedtime.  Use fluoride at bedtime. 2. Use trismus exercises as directed. 3. Use Biotene Rinse or salt water/baking soda rinses. 4. Multiple sips of water as needed. 5. Return to clinic in two months for oral exam after radiation therapy. 6. Suggest canceling appointment with Dr. Belva Agee. Patient is a call Dr. Belva Agee to see if appointment is necessary. Call if problems before then.  Lenn Cal, DDS

## 2014-06-03 NOTE — Progress Notes (Signed)
06/03/2014  Patient:            Christopher Mullins Date of Birth:  [birth year] MRN:                [id number]  BP 108/74  Pulse 87  Temp(Src) 98.5 F (36.9 C) (Oral)  Wt 180 lb (81.647 kg)  [name] presents for periodic oral examination during radiation therapy. Patient has completed 12/35 radiation treatments. Patient scheduled for a second chemotherapy treatment this Wednesday. Patient's last radiation treatment is scheduled for 07/03/2014.  REVIEW OF CHIEF COMPLAINTS: DRY MOUTH: Yes HARD TO SWALLOW: Yes  HURT TO SWALLOW: Yes TASTE CHANGES: Patient has no taste remaining SORES IN MOUTH: Yes TRISMUS: No problems with trismus symptoms. WEIGHT: 185 lbs. HOME OH REGIMEN:  BRUSHING: Three times daily. FLOSSING: At bedtime RINSING: He is using Biotene and salt water/baking soda rinses. Patient also has a prescription for magic mouthwash. FLUORIDE: Using fluoride at bedtime. TRISMUS EXERCISES:  Maximum interincisal opening: 45 mm   DENTAL EXAM:  Oral Hygiene:(PLAQUE): Good oral hygiene. LOCATION OF MUCOSITIS: Right tonsil and back of throat. DESCRIPTION OF SALIVA: Decreased saliva. Foamy saliva. ANY EXPOSED BONE: None noted OTHER WATCHED AREAS: Previous extraction sites are 2 and 31. Previous root canal therapy site #19. DX: Xerostomia, Dysgeusia, Dysphagia, Odynophagia, Weight Loss and Mucositis  RECOMMENDATIONS: 1. Brush after meals and at bedtime.  Use fluoride at bedtime. 2. Use trismus exercises as directed. 3. Use Biotene Rinse or salt water/baking soda rinses. 4. Multiple sips of water as needed. 5. Return to clinic in two months for oral exam after radiation therapy. 6. Suggest canceling appointment with Dr. Belva Agee. Patient is a call Dr. Belva Agee to see if appointment is necessary. Call if problems before then.  Lenn Cal, DDS

## 2014-06-03 NOTE — Progress Notes (Signed)
   Weekly Management Note:  outpatient Current Dose:  26 Gy  Projected Dose: 70 Gy    ICD-9-CM   1. Tonsil cancer 146.0 fluconazole (DIFLUCAN) 100 MG tablet  2. Thrush 112.0 fluconazole (DIFLUCAN) 100 MG tablet    Narrative:  The patient presents for routine under treatment assessment.  CBCT/MVCT images/Port film x-rays were reviewed.  The chart was checked. Drinking a lot of fluids and nutritional supplements.  Liquid morphine helps throat pain. Walking for exercise.  Physical Findings:  height is 5\' 8"  (1.727 m) and weight is 179 lb 1.6 oz (81.239 kg). His temperature is 98.6 F (37 C). His blood pressure is 109/71 and his pulse is 101.  NAD, thrush on hard palate. Erythematous mucosa which is moist in oropharynx. Neck skin -intact. Right neck mass visible.  CBC    Component Value Date/Time   WBC 6.0 05/13/2014 1159   WBC 8.7 05/11/2014 0518   RBC 4.99 05/13/2014 1159   RBC 4.11* 05/11/2014 0518   HGB 15.5 05/13/2014 1159   HGB 12.7* 05/11/2014 0518   HCT 46.1 05/13/2014 1159   HCT 37.3* 05/11/2014 0518   PLT 202 05/13/2014 1159   PLT 180 05/11/2014 0518   MCV 92.4 05/13/2014 1159   MCV 90.8 05/11/2014 0518   MCH 31.1 05/13/2014 1159   MCH 30.9 05/11/2014 0518   MCHC 33.6 05/13/2014 1159   MCHC 34.0 05/11/2014 0518   RDW 12.5 05/13/2014 1159   RDW 12.7 05/11/2014 0518   LYMPHSABS 2.3 05/13/2014 1159   MONOABS 0.9 05/13/2014 1159   EOSABS 0.2 05/13/2014 1159   BASOSABS 0.0 05/13/2014 1159     CMP     Component Value Date/Time   NA 141 05/13/2014 1159   NA 139 05/11/2014 0518   K 3.7 05/13/2014 1159   K 4.6 05/11/2014 0518   CL 103 05/11/2014 0518   CO2 26 05/13/2014 1159   CO2 25 05/11/2014 0518   GLUCOSE 110 05/13/2014 1159   GLUCOSE 127* 05/11/2014 0518   BUN 12.3 05/13/2014 1159   BUN 14 05/11/2014 0518   CREATININE 0.9 05/13/2014 1159   CREATININE 0.87 05/11/2014 0518   CALCIUM 9.5 05/13/2014 1159   CALCIUM 9.0 05/11/2014 0518   PROT 7.5 05/13/2014 1159   PROT 7.3 05/09/2014 1130   ALBUMIN 3.6 05/13/2014 1159   ALBUMIN 3.6 05/09/2014 1130   AST 150* 05/13/2014 1159   AST 108* 05/09/2014 1130   ALT 228* 05/13/2014 1159   ALT 180* 05/09/2014 1130   ALKPHOS 44 05/13/2014 1159   ALKPHOS 43 05/09/2014 1130   BILITOT 0.60 05/13/2014 1159   BILITOT 0.4 05/09/2014 1130   GFRNONAA >90 05/11/2014 0518   GFRAA >90 05/11/2014 0518     Impression:  The patient is tolerating radiotherapy.   Plan:  Continue radiotherapy as planned. Slight elevation in pulse on standing, but hydrating well and not lightheaded.  No IV fluids today. Labs tomorrow. Chemo the day thereafter. Start Fluconazole for thrush. Defer to med/onc on narcotics -possible duragesic in future. -----------------------------------  Eppie Gibson, MD

## 2014-06-04 ENCOUNTER — Ambulatory Visit
Admission: RE | Admit: 2014-06-04 | Discharge: 2014-06-04 | Disposition: A | Discharge status: Home / Self Care | Payer: BC Managed Care – PPO | Source: Ambulatory Visit | Attending: Radiation Oncology | Admitting: Radiation Oncology

## 2014-06-04 ENCOUNTER — Other Ambulatory Visit (HOSPITAL_BASED_OUTPATIENT_CLINIC_OR_DEPARTMENT_OTHER): Payer: BC Managed Care – PPO

## 2014-06-04 DIAGNOSIS — C099 Malignant neoplasm of tonsil, unspecified: Secondary | ICD-10-CM

## 2014-06-04 LAB — CBC WITH DIFFERENTIAL/PLATELET
BASO%: 1 % (ref 0.0–2.0)
Basophils Absolute: 0 10*3/uL (ref 0.0–0.1)
EOS%: 1.7 % (ref 0.0–7.0)
Eosinophils Absolute: 0.1 10*3/uL (ref 0.0–0.5)
HCT: 37.9 % — ABNORMAL LOW (ref 38.4–49.9)
HGB: 13.2 g/dL (ref 13.0–17.1)
LYMPH%: 29.4 % (ref 14.0–49.0)
MCH: 31 pg (ref 27.2–33.4)
MCHC: 34.8 g/dL (ref 32.0–36.0)
MCV: 89 fL (ref 79.3–98.0)
MONO#: 1.1 10*3/uL — AB (ref 0.1–0.9)
MONO%: 36.9 % — ABNORMAL HIGH (ref 0.0–14.0)
NEUT%: 31 % — ABNORMAL LOW (ref 39.0–75.0)
NEUTROS ABS: 0.9 10*3/uL — AB (ref 1.5–6.5)
NRBC: 0 % (ref 0–0)
PLATELETS: 332 10*3/uL (ref 140–400)
RBC: 4.26 10*6/uL (ref 4.20–5.82)
RDW: 12.4 % (ref 11.0–14.6)
WBC: 2.9 10*3/uL — AB (ref 4.0–10.3)
lymph#: 0.9 10*3/uL (ref 0.9–3.3)

## 2014-06-04 LAB — COMPREHENSIVE METABOLIC PANEL (CC13)
ALT: 268 U/L — AB (ref 0–55)
AST: 132 U/L — AB (ref 5–34)
Albumin: 3.4 g/dL — ABNORMAL LOW (ref 3.5–5.0)
Alkaline Phosphatase: 64 U/L (ref 40–150)
Anion Gap: 8 mEq/L (ref 3–11)
BUN: 18.3 mg/dL (ref 7.0–26.0)
CO2: 28 mEq/L (ref 22–29)
CREATININE: 1 mg/dL (ref 0.7–1.3)
Calcium: 9.5 mg/dL (ref 8.4–10.4)
Chloride: 99 mEq/L (ref 98–109)
Glucose: 118 mg/dl (ref 70–140)
POTASSIUM: 4.6 meq/L (ref 3.5–5.1)
Sodium: 135 mEq/L — ABNORMAL LOW (ref 136–145)
Total Bilirubin: 0.57 mg/dL (ref 0.20–1.20)
Total Protein: 7.5 g/dL (ref 6.4–8.3)

## 2014-06-04 LAB — MAGNESIUM (CC13): MAGNESIUM: 2.1 mg/dL (ref 1.5–2.5)

## 2014-06-05 ENCOUNTER — Encounter: Payer: Self-pay | Admitting: *Deleted

## 2014-06-05 ENCOUNTER — Encounter: Payer: Self-pay | Admitting: Hematology and Oncology

## 2014-06-05 ENCOUNTER — Telehealth: Payer: Self-pay | Admitting: Hematology and Oncology

## 2014-06-05 ENCOUNTER — Ambulatory Visit (HOSPITAL_BASED_OUTPATIENT_CLINIC_OR_DEPARTMENT_OTHER): Payer: BC Managed Care – PPO | Admitting: Hematology and Oncology

## 2014-06-05 ENCOUNTER — Ambulatory Visit: Payer: BC Managed Care – PPO | Admitting: Nutrition

## 2014-06-05 ENCOUNTER — Ambulatory Visit (HOSPITAL_BASED_OUTPATIENT_CLINIC_OR_DEPARTMENT_OTHER): Payer: BC Managed Care – PPO

## 2014-06-05 ENCOUNTER — Ambulatory Visit
Admission: RE | Admit: 2014-06-05 | Discharge: 2014-06-05 | Disposition: A | Discharge status: Home / Self Care | Payer: BC Managed Care – PPO | Source: Ambulatory Visit | Attending: Radiation Oncology | Admitting: Radiation Oncology

## 2014-06-05 VITALS — BP 110/67 | HR 89 | Temp 98.5°F | Resp 18 | Ht 68.0 in | Wt 178.0 lb

## 2014-06-05 DIAGNOSIS — R7402 Elevation of levels of lactic acid dehydrogenase (LDH): Secondary | ICD-10-CM

## 2014-06-05 DIAGNOSIS — R7401 Elevation of levels of liver transaminase levels: Secondary | ICD-10-CM

## 2014-06-05 DIAGNOSIS — K1231 Oral mucositis (ulcerative) due to antineoplastic therapy: Secondary | ICD-10-CM | POA: Insufficient documentation

## 2014-06-05 DIAGNOSIS — E860 Dehydration: Secondary | ICD-10-CM

## 2014-06-05 DIAGNOSIS — R070 Pain in throat: Secondary | ICD-10-CM

## 2014-06-05 DIAGNOSIS — C099 Malignant neoplasm of tonsil, unspecified: Secondary | ICD-10-CM | POA: Diagnosis not present

## 2014-06-05 DIAGNOSIS — R740 Nonspecific elevation of levels of transaminase and lactic acid dehydrogenase [LDH]: Secondary | ICD-10-CM

## 2014-06-05 MED ORDER — SODIUM CHLORIDE 0.9 % IV SOLN
500.0000 mL | Freq: Once | INTRAVENOUS | Status: AC
Start: 2014-06-05 — End: 2014-06-05
  Administered 2014-06-05: 1000 mL via INTRAVENOUS

## 2014-06-05 MED ORDER — ONDANSETRON 8 MG/NS 50 ML IVPB
INTRAVENOUS | Status: AC
  Filled 2014-06-05: qty 8

## 2014-06-05 MED ORDER — SODIUM CHLORIDE 0.9 % IJ SOLN
10.0000 mL | INTRAMUSCULAR | Status: DC | PRN
  Administered 2014-06-05: 10 mL
  Filled 2014-06-05: qty 10

## 2014-06-05 MED ORDER — HEPARIN SOD (PORK) LOCK FLUSH 100 UNIT/ML IV SOLN
500.0000 [IU] | Freq: Once | INTRAVENOUS | Status: AC | PRN
  Administered 2014-06-05: 500 [IU]
  Filled 2014-06-05: qty 5

## 2014-06-05 MED ORDER — ONDANSETRON 8 MG/50ML IVPB (CHCC): 8.0000 mg | Freq: Every day | INTRAVENOUS | Status: DC | PRN

## 2014-06-05 NOTE — Assessment & Plan Note (Signed)
His recent lab work and clinical examination confirms mild dehydration. I recommend daily IV fluids this week.

## 2014-06-05 NOTE — Progress Notes (Signed)
Nutrition followup completed with patient and wife, during IV fluids.  Weight documented as 178 pounds on July 22.  Patient reports he continues to consume between 2500 and 2800 calories daily.  Typically he will drink 3 milkshakes daily made with Carnation instant breakfast and whole milk.  Reports he has no taste.  Noted patient has been diagnosed with thrush.  Nutrition diagnosis: Food and nutrition related knowledge deficit continues.  Intervention: Patient to continue Carnation breakfast essentials shakes 3 times a day along with high-calorie, high-protein meals and snacks.  Patient to consume yogurt once daily.  Questions were answered and teach back method used.  Monitoring, evaluation, goals: Patient has been able to tolerate increased oral intake and has had minimal weight loss.  Next visit: Tuesday, July 28 in the infusion room.  **Disclaimer: This note was dictated with voice recognition software. Similar sounding words can inadvertently be transcribed and this note may contain transcription errors which may not have been corrected upon publication of note.**

## 2014-06-05 NOTE — Progress Notes (Signed)
[facility] OFFICE PROGRESS NOTE  Patient Care Team: Gavin Pound, MD as PCP - General (Family Medicine) Brooks Sailors, RN as Oncology Nurse Navigator (Oncology)  SUMMARY OF ONCOLOGIC HISTORY: Oncology History   Tonsil cancer, Right, HPV positive,    Primary site: Pharynx - Oropharynx   Staging method: AJCC 7th Edition   Clinical: Stage IVA (T1, N2, M0) signed by Heath Lark, MD on 04/17/2014  1:04 PM   Summary: Stage IVA (T1, N2, M0)       Tonsil cancer   03/20/2014 Imaging Ct scan of neck showed several complex solid and cystic lesions within the right neck and abnormalities in the pharynx   03/25/2014 Procedure 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 Multiple hypermetabolic right level 2 lymph nodes compatible with metastatic adenopathy   05/10/2014 Surgery The patient has placement of port and feeding tube.   05/15/2014 -  Chemotherapy He received high dose cisplatin every 3 weeks   05/15/2014 -  Radiation Therapy He received radiation treatment   06/05/2014 Adverse Reaction Cycle 2 of chemotherapy is delayed due to neutropenia.    INTERVAL HISTORY: Please see below for problem oriented charting. He is seen prior to cycle 2 of treatment. Two days ago, he was noted to have thrush. He is placed on fluconazole. He is using liquid morphine for mucositis and throat pain. He felt that his pain is currently well-controlled. Denies any nausea, vomiting or constipation. The skin infection around his feeding tube has resolved. He has lost 1 pound in weight since last week.  REVIEW OF SYSTEMS:   Constitutional: Denies fevers, chills  Eyes: Denies blurriness of vision Respiratory: Denies cough, dyspnea or wheezes Cardiovascular: Denies palpitation, chest discomfort or lower  extremity swelling Gastrointestinal:  Denies nausea, heartburn or change in bowel habits Skin: Denies abnormal skin rashes Lymphatics: Denies new lymphadenopathy or easy bruising Neurological:Denies numbness, tingling or new weaknesses Behavioral/Psych: Mood is stable, no new changes  All other systems were reviewed with the patient and are negative.  I have reviewed the past medical history, past surgical history, social history and family history with the patient and they are unchanged from previous note.  ALLERGIES:  is allergic to sulfa antibiotics.  MEDICATIONS:  Current Outpatient Prescriptions  Medication Sig Dispense Refill  . Alum & Mag Hydroxide-Simeth (MAGIC MOUTHWASH W/LIDOCAINE) SOLN 1 part 2% viscous lidocaine, 1 part H20. Swish and/or swallow 10 mL 30 min before meals and bedtime, up to QID.  480 mL  5  . clobetasol cream (TEMOVATE) 7.78 % Apply 1 application topically daily as needed (Psorasis).       . fluconazole (DIFLUCAN) 100 MG tablet Take 2 tablets today, then 1 tablet daily x 20 more days.  22 tablet  0  . lidocaine-prilocaine (EMLA) cream Apply 1 application topically as needed. Apply to [facility] a Cath site one hour prior to needle stick.  30 g  3  . morphine (ROXANOL) 20 MG/ML concentrated solution Take 0.5 mLs (10 mg total) by mouth every 2 (two) hours as needed for severe pain.  120 mL  0  . ondansetron (ZOFRAN) 8 MG tablet Take 1 tablet (8 mg total) by mouth every 8 (eight) hours as needed for nausea or vomiting. Start on the third day after chemotherapy.  30 tablet  1  . polyethylene glycol (  MIRALAX) packet Take 17 g by mouth daily.  14 each  0  . prochlorperazine (COMPAZINE) 10 MG tablet Take 1 tablet (10 mg total) by mouth every 6 (six) hours as needed (Nausea or vomiting).  30 tablet  1  . sodium fluoride (FLUORISHIELD) 1.1 % GEL dental gel Instill one drop of fluoride per tooth space of fluoride tray. Place over teeth for 5 minutes. Remove. Spit out excess. Repeat  nightly.  120 mL  prn  . tobramycin (TOBREX) 0.3 % ophthalmic solution Place 1 drop into both eyes every 4 (four) hours.  5 mL  0   No current facility-administered medications for this visit.    PHYSICAL EXAMINATION: ECOG PERFORMANCE STATUS: 1 - Symptomatic but completely ambulatory  Filed Vitals:   06/05/14 0945  BP: 110/67  Pulse: 89  Temp: 98.5 F (36.9 C)  Resp: 18   Filed Weights   06/05/14 0945  Weight: 178 lb (80.74 kg)    GENERAL:alert, no distress and comfortable SKIN: skin color, texture, turgor are normal, no rashes or significant lesions EYES: normal, Conjunctiva are pink and non-injected, sclera clear OROPHARYNX: No evidence of mucositis. No thrush. NECK: supple, thyroid normal size, non-tender, without nodularity LYMPH: Persistent palpable lymphadenopathy on the right of his neck.  LUNGS: clear to auscultation and percussion with normal breathing effort HEART: regular rate & rhythm and no murmurs and no lower extremity edema ABDOMEN:abdomen soft, non-tender and normal bowel sounds. Feeding tube site looks okay. Musculoskeletal:no cyanosis of digits and no clubbing  NEURO: alert & oriented x 3 with fluent speech, no focal motor/sensory deficits  LABORATORY DATA:  I have reviewed the data as listed    Component Value Date/Time   NA 135* 06/04/2014 1348   NA 139 05/11/2014 0518   K 4.6 06/04/2014 1348   K 4.6 05/11/2014 0518   CL 103 05/11/2014 0518   CO2 28 06/04/2014 1348   CO2 25 05/11/2014 0518   GLUCOSE 118 06/04/2014 1348   GLUCOSE 127* 05/11/2014 0518   BUN 18.3 06/04/2014 1348   BUN 14 05/11/2014 0518   CREATININE 1.0 06/04/2014 1348   CREATININE 0.87 05/11/2014 0518   CALCIUM 9.5 06/04/2014 1348   CALCIUM 9.0 05/11/2014 0518   PROT 7.5 06/04/2014 1348   PROT 7.3 05/09/2014 1130   ALBUMIN 3.4* 06/04/2014 1348   ALBUMIN 3.6 05/09/2014 1130   AST 132* 06/04/2014 1348   AST 108* 05/09/2014 1130   ALT 268* 06/04/2014 1348   ALT 180* 05/09/2014 1130   ALKPHOS 64  06/04/2014 1348   ALKPHOS 43 05/09/2014 1130   BILITOT 0.57 06/04/2014 1348   BILITOT 0.4 05/09/2014 1130   GFRNONAA >90 05/11/2014 0518   GFRAA >90 05/11/2014 0518    No results found for this basename: SPEP, UPEP,  kappa and lambda light chains    Lab Results  Component Value Date   WBC 2.9* 06/04/2014   NEUTROABS 0.9* 06/04/2014   HGB 13.2 06/04/2014   HCT 37.9* 06/04/2014   MCV 89.0 06/04/2014   PLT 332 06/04/2014      Chemistry      Component Value Date/Time   NA 135* 06/04/2014 1348   NA 139 05/11/2014 0518   K 4.6 06/04/2014 1348   K 4.6 05/11/2014 0518   CL 103 05/11/2014 0518   CO2 28 06/04/2014 1348   CO2 25 05/11/2014 0518   BUN 18.3 06/04/2014 1348   BUN 14 05/11/2014 0518   CREATININE 1.0 06/04/2014 1348   CREATININE 0.87  05/11/2014 0518      Component Value Date/Time   CALCIUM 9.5 06/04/2014 1348   CALCIUM 9.0 05/11/2014 0518   ALKPHOS 64 06/04/2014 1348   ALKPHOS 43 05/09/2014 1130   AST 132* 06/04/2014 1348   AST 108* 05/09/2014 1130   ALT 268* 06/04/2014 1348   ALT 180* 05/09/2014 1130   BILITOT 0.57 06/04/2014 1348   BILITOT 0.4 05/09/2014 1130      ASSESSMENT & PLAN:  Tonsil cancer Due to leukopenia, I would delay chemotherapy until next week. In the meantime, I would continue on aggressive supportive care.   Throat pain I recommended he use Magic mouthwash. I encourage patient to use prescription liquid morphine as needed if the pain is not controlled with Magic mouthwash.    Elevated transaminase level This is likely related to a flare of hepatitis C. Continue to monitor closely.  Dehydration His recent lab work and clinical examination confirms mild dehydration. I recommend daily IV fluids this week.  Mucositis (ulcerative) due to antineoplastic therapy I recommend conservative management. His pain appeared to be well controlled. He was diagnosed with recent yeast infection and is on fluconazole. I warned him about potential interaction with his  antiemetics.   No orders of the defined types were placed in this encounter.   All questions were answered. The patient knows to call the clinic with any problems, questions or concerns. No barriers to learning was detected. I spent 40 minutes counseling the patient face to face. The total time spent in the appointment was 55 minutes and more than 50% was on counseling and review of test results     [facility], [place], MD 06/05/2014 9:30 PM

## 2014-06-05 NOTE — Assessment & Plan Note (Signed)
This is likely related to a flare of hepatitis C. Continue to monitor closely.

## 2014-06-05 NOTE — Assessment & Plan Note (Signed)
I recommend conservative management. His pain appeared to be well controlled. He was diagnosed with recent yeast infection and is on fluconazole. I warned him about potential interaction with his antiemetics.

## 2014-06-05 NOTE — Assessment & Plan Note (Addendum)
Due to leukopenia, I would delay chemotherapy until next week. In the meantime, I would continue on aggressive supportive care.

## 2014-06-05 NOTE — Assessment & Plan Note (Signed)
I recommended he use Magic mouthwash. I encourage patient to use prescription liquid morphine as needed if the pain is not controlled with Magic mouthwash.

## 2014-06-05 NOTE — Telephone Encounter (Signed)
gv and printed appt sched adn avs for pt for July adn Aug....sed added tx. °

## 2014-06-05 NOTE — Patient Instructions (Signed)

## 2014-06-05 NOTE — Progress Notes (Signed)
To provide support, encouragement and care continuity, met with patient and his wife during [place] appt with Dr. Alvy Bimler.  Chemo cancelled for this week d/t blood work; I supported Dr. Calton Dach explanation with further discussion.  Later visited patient in Infusion where he was receiving fluids to mitigate early signs of dehydration.  Initiating navigation as L2 patient (treatments established) with this encounter.  Gayleen Orem, RN, BSN, [facility] Oncology Navigator [phone]

## 2014-06-05 NOTE — Telephone Encounter (Signed)
gv and printed appt sched and avs for ptf or July adn Aug...sed added tx. °

## 2014-06-06 ENCOUNTER — Ambulatory Visit: Payer: Self-pay

## 2014-06-06 ENCOUNTER — Ambulatory Visit (HOSPITAL_BASED_OUTPATIENT_CLINIC_OR_DEPARTMENT_OTHER): Payer: BC Managed Care – PPO

## 2014-06-06 ENCOUNTER — Ambulatory Visit
Admission: RE | Admit: 2014-06-06 | Discharge: 2014-06-06 | Disposition: A | Discharge status: Home / Self Care | Payer: BC Managed Care – PPO | Source: Ambulatory Visit | Attending: Radiation Oncology | Admitting: Radiation Oncology

## 2014-06-06 VITALS — BP 122/70 | HR 71 | Temp 98.1°F | Resp 18

## 2014-06-06 DIAGNOSIS — C099 Malignant neoplasm of tonsil, unspecified: Secondary | ICD-10-CM

## 2014-06-06 DIAGNOSIS — E860 Dehydration: Secondary | ICD-10-CM

## 2014-06-06 MED ORDER — SODIUM CHLORIDE 0.9 % IJ SOLN
10.0000 mL | INTRAMUSCULAR | Status: DC | PRN
  Administered 2014-06-06: 10 mL
  Filled 2014-06-06: qty 10

## 2014-06-06 MED ORDER — HEPARIN SOD (PORK) LOCK FLUSH 100 UNIT/ML IV SOLN
500.0000 [IU] | Freq: Once | INTRAVENOUS | Status: AC | PRN
  Administered 2014-06-06: 500 [IU]
  Filled 2014-06-06: qty 5

## 2014-06-06 MED ORDER — SODIUM CHLORIDE 0.9 % IV SOLN
Freq: Once | INTRAVENOUS | Status: AC
  Administered 2014-06-06: 14:00:00 via INTRAVENOUS

## 2014-06-06 NOTE — Patient Instructions (Signed)

## 2014-06-06 NOTE — Progress Notes (Signed)
Pt denies any need for nausea medication today.

## 2014-06-07 ENCOUNTER — Ambulatory Visit (HOSPITAL_BASED_OUTPATIENT_CLINIC_OR_DEPARTMENT_OTHER): Payer: BC Managed Care – PPO

## 2014-06-07 ENCOUNTER — Ambulatory Visit
Admission: RE | Admit: 2014-06-07 | Discharge: 2014-06-07 | Disposition: A | Discharge status: Home / Self Care | Payer: BC Managed Care – PPO | Source: Ambulatory Visit | Attending: Radiation Oncology | Admitting: Radiation Oncology

## 2014-06-07 VITALS — BP 111/62 | HR 95 | Temp 98.5°F | Resp 18

## 2014-06-07 DIAGNOSIS — C099 Malignant neoplasm of tonsil, unspecified: Secondary | ICD-10-CM | POA: Diagnosis not present

## 2014-06-07 DIAGNOSIS — E860 Dehydration: Secondary | ICD-10-CM

## 2014-06-07 MED ORDER — SODIUM CHLORIDE 0.9 % IV SOLN
Freq: Once | INTRAVENOUS | Status: AC
  Administered 2014-06-07: 11:00:00 via INTRAVENOUS

## 2014-06-07 MED ORDER — SODIUM CHLORIDE 0.9 % IJ SOLN
10.0000 mL | INTRAMUSCULAR | Status: DC | PRN
  Administered 2014-06-07: 10 mL
  Filled 2014-06-07: qty 10

## 2014-06-07 MED ORDER — HEPARIN SOD (PORK) LOCK FLUSH 100 UNIT/ML IV SOLN
500.0000 [IU] | Freq: Once | INTRAVENOUS | Status: AC | PRN
  Administered 2014-06-07: 500 [IU]
  Filled 2014-06-07: qty 5

## 2014-06-10 ENCOUNTER — Other Ambulatory Visit: Payer: Self-pay | Admitting: Hematology and Oncology

## 2014-06-10 ENCOUNTER — Other Ambulatory Visit (HOSPITAL_BASED_OUTPATIENT_CLINIC_OR_DEPARTMENT_OTHER): Payer: BC Managed Care – PPO

## 2014-06-10 ENCOUNTER — Telehealth: Payer: Self-pay | Admitting: *Deleted

## 2014-06-10 ENCOUNTER — Encounter: Payer: Self-pay | Admitting: Radiation Oncology

## 2014-06-10 ENCOUNTER — Ambulatory Visit
Admission: RE | Admit: 2014-06-10 | Discharge: 2014-06-10 | Disposition: A | Discharge status: Home / Self Care | Payer: BC Managed Care – PPO | Source: Ambulatory Visit | Attending: Radiation Oncology | Admitting: Radiation Oncology

## 2014-06-10 VITALS — BP 104/72 | HR 86 | Temp 98.2°F | Resp 20 | Wt 176.6 lb

## 2014-06-10 DIAGNOSIS — C099 Malignant neoplasm of tonsil, unspecified: Secondary | ICD-10-CM

## 2014-06-10 LAB — COMPREHENSIVE METABOLIC PANEL (CC13)
ALK PHOS: 56 U/L (ref 40–150)
ALT: 84 U/L — ABNORMAL HIGH (ref 0–55)
AST: 37 U/L — ABNORMAL HIGH (ref 5–34)
Albumin: 3.4 g/dL — ABNORMAL LOW (ref 3.5–5.0)
Anion Gap: 7 mEq/L (ref 3–11)
BILIRUBIN TOTAL: 0.47 mg/dL (ref 0.20–1.20)
BUN: 14.4 mg/dL (ref 7.0–26.0)
CO2: 31 mEq/L — ABNORMAL HIGH (ref 22–29)
CREATININE: 1 mg/dL (ref 0.7–1.3)
Calcium: 10 mg/dL (ref 8.4–10.4)
Chloride: 100 mEq/L (ref 98–109)
GLUCOSE: 89 mg/dL (ref 70–140)
Potassium: 4.6 mEq/L (ref 3.5–5.1)
Sodium: 139 mEq/L (ref 136–145)
Total Protein: 7.6 g/dL (ref 6.4–8.3)

## 2014-06-10 LAB — CBC WITH DIFFERENTIAL/PLATELET
BASO%: 0.3 % (ref 0.0–2.0)
Basophils Absolute: 0 10e3/uL (ref 0.0–0.1)
EOS%: 1.2 % (ref 0.0–7.0)
Eosinophils Absolute: 0.1 10e3/uL (ref 0.0–0.5)
HCT: 39.7 % (ref 38.4–49.9)
HGB: 13.7 g/dL (ref 13.0–17.1)
LYMPH%: 15 % (ref 14.0–49.0)
MCH: 31.1 pg (ref 27.2–33.4)
MCHC: 34.5 g/dL (ref 32.0–36.0)
MCV: 90 fL (ref 79.3–98.0)
MONO#: 1.3 10e3/uL — ABNORMAL HIGH (ref 0.1–0.9)
MONO%: 22.2 % — ABNORMAL HIGH (ref 0.0–14.0)
NEUT#: 3.5 10e3/uL (ref 1.5–6.5)
NEUT%: 61.3 % (ref 39.0–75.0)
Platelets: 381 10e3/uL (ref 140–400)
RBC: 4.41 10e6/uL (ref 4.20–5.82)
RDW: 12.6 % (ref 11.0–14.6)
WBC: 5.7 10e3/uL (ref 4.0–10.3)
lymph#: 0.9 10e3/uL (ref 0.9–3.3)

## 2014-06-10 LAB — MAGNESIUM (CC13): Magnesium: 2.2 mg/dl (ref 1.5–2.5)

## 2014-06-10 NOTE — Telephone Encounter (Signed)
Pt notified of message below.

## 2014-06-10 NOTE — Telephone Encounter (Signed)
Message copied by Patton Salles on Mon Jun 10, 2014  1:35 PM ------      Message from: [facility], [place]      Created: Mon Jun 10, 2014  1:29 PM      Regarding: cbc ok       We will proceed with Rx tomorrow      ----- Message -----         From: [facility]         Sent: 06/10/2014   1:11 PM           To: Heath Lark, MD                   ------

## 2014-06-10 NOTE — Progress Notes (Signed)
Patient states his throat "is always sore". He is taking Morphine 2-3 x daily, is not using MMW. He states he gets good relief w/Morphine solution. Pt is not eating solids but drinking Ensure, Carnation Instant breakfast drinks, milkshakes. He has lost 1.4 lb since last week. He flushes his peg tube daily with water. Pt completed 1 week of Diflucan for thrush, no signs in mouth today. He is applying Biafine to neck treatment area for hyperpigmentation of skin. Patient to have chemo tomorrow.

## 2014-06-10 NOTE — Progress Notes (Signed)
Weekly Management Note:  Site: Right tonsil/bilateral neck Current Dose:  3600  cGy Projected Dose: 7000  cGy  Narrative: The patient is seen today for routine under treatment assessment. CBCT/MVCT images/port films were reviewed. The chart was reviewed.   He is without new complaints today. He does have a moderately sore throat for which she uses morphine solution. He takes his nutrition by mouth and has not yet started his PEG tube feedings. He is scheduled for his next cycle of chemotherapy tomorrow. His weight is down 1-1/2 pounds over the past week.  Physical Examination:  Filed Vitals:   06/10/14 1351  BP: 104/72  Pulse: 86  Temp: 98.2 F (36.8 C)  Resp: 20  .  Weight: 176 lb 9.6 oz (80.105 kg). There is still obvious adenopathy along the right upper neck. There is minimal bulk disease along his right tonsil with tumoritis with mild mucositis of the right oropharynx. His mouth is moist.  Laboratory data: Lab Results  Component Value Date   WBC 5.7 06/10/2014   HGB 13.7 06/10/2014   HCT 39.7 06/10/2014   MCV 90.0 06/10/2014   PLT 381 06/10/2014    Impression: Tolerating radiation therapy well.  Plan: Continue radiation therapy as planned.

## 2014-06-11 ENCOUNTER — Ambulatory Visit
Admission: RE | Admit: 2014-06-11 | Discharge: 2014-06-11 | Disposition: A | Discharge status: Home / Self Care | Payer: BC Managed Care – PPO | Source: Ambulatory Visit | Attending: Radiation Oncology | Admitting: Radiation Oncology

## 2014-06-11 ENCOUNTER — Ambulatory Visit (HOSPITAL_BASED_OUTPATIENT_CLINIC_OR_DEPARTMENT_OTHER): Payer: BC Managed Care – PPO

## 2014-06-11 ENCOUNTER — Ambulatory Visit: Payer: BC Managed Care – PPO | Admitting: Nutrition

## 2014-06-11 VITALS — BP 111/69 | HR 97 | Temp 98.4°F | Resp 18

## 2014-06-11 DIAGNOSIS — C099 Malignant neoplasm of tonsil, unspecified: Secondary | ICD-10-CM | POA: Diagnosis not present

## 2014-06-11 DIAGNOSIS — Z5111 Encounter for antineoplastic chemotherapy: Secondary | ICD-10-CM

## 2014-06-11 MED ORDER — PALONOSETRON HCL INJECTION 0.25 MG/5ML
0.2500 mg | Freq: Once | INTRAVENOUS | Status: AC
  Administered 2014-06-11: 0.25 mg via INTRAVENOUS

## 2014-06-11 MED ORDER — PALONOSETRON HCL INJECTION 0.25 MG/5ML
INTRAVENOUS | Status: AC
  Filled 2014-06-11: qty 5

## 2014-06-11 MED ORDER — DEXAMETHASONE SODIUM PHOSPHATE 20 MG/5ML IJ SOLN
12.0000 mg | Freq: Once | INTRAMUSCULAR | Status: AC
  Administered 2014-06-11: 12 mg via INTRAVENOUS

## 2014-06-11 MED ORDER — HEPARIN SOD (PORK) LOCK FLUSH 100 UNIT/ML IV SOLN
500.0000 [IU] | Freq: Once | INTRAVENOUS | Status: AC | PRN
  Administered 2014-06-11: 500 [IU]
  Filled 2014-06-11: qty 5

## 2014-06-11 MED ORDER — CISPLATIN CHEMO INJECTION 100MG/100ML
100.0000 mg/m2 | Freq: Once | INTRAVENOUS | Status: AC
  Administered 2014-06-11: 200 mg via INTRAVENOUS
  Filled 2014-06-11: qty 200

## 2014-06-11 MED ORDER — SODIUM CHLORIDE 0.9 % IV SOLN
150.0000 mg | Freq: Once | INTRAVENOUS | Status: AC
  Administered 2014-06-11: 150 mg via INTRAVENOUS
  Filled 2014-06-11: qty 5

## 2014-06-11 MED ORDER — DEXAMETHASONE SODIUM PHOSPHATE 20 MG/5ML IJ SOLN
INTRAMUSCULAR | Status: AC
  Filled 2014-06-11: qty 5

## 2014-06-11 MED ORDER — SODIUM CHLORIDE 0.9 % IJ SOLN
10.0000 mL | INTRAMUSCULAR | Status: DC | PRN
  Administered 2014-06-11: 10 mL
  Filled 2014-06-11: qty 10

## 2014-06-11 MED ORDER — POTASSIUM CHLORIDE 2 MEQ/ML IV SOLN
Freq: Once | INTRAVENOUS | Status: AC
  Administered 2014-06-11: 09:00:00 via INTRAVENOUS
  Filled 2014-06-11: qty 10

## 2014-06-11 NOTE — Progress Notes (Signed)
Nutrition followup completed with patient during chemotherapy.  Weight decreased slightly to 176.6 pounds July 27 from 178 pounds July 22.  Weight is down approximately 5% from usual body weight 187 pounds June 3.  Patient continues oral intake with increased hydration.  Patient reports" I've slacked off (on eating) this weekend."  Patient focused on hydration, before chemotherapy.  Patient has good understanding of calorie and protein needs.  He continues to flush feeding tube with water.  Takes all nutrition by mouth.  Nutrition diagnosis: Food and nutrition related knowledge deficit continues.  Intervention: Patient to continue Carnation breakfast essentials milkshakes 3 times a day along with high-calorie, high-protein meals and snacks.  Recommended patient try one can of Osmolite 1.5 via feeding tube with 60 cc free water before and after to establish comfort level with feeding tube.  Patient is agreeable.  Questions were answered and teach back method used.  Monitoring, evaluation, goals: Patient will tolerate adequate calories and protein with oral intake/feeding tube to minimize weight loss.  Next visit: Wednesday, August 12, during chemotherapy.  I will followup with patient, sooner if needed.  **Disclaimer: This note was dictated with voice recognition software. Similar sounding words can inadvertently be transcribed and this note may contain transcription errors which may not have been corrected upon publication of note.**

## 2014-06-12 ENCOUNTER — Ambulatory Visit: Payer: BC Managed Care – PPO

## 2014-06-12 ENCOUNTER — Encounter: Payer: Self-pay | Admitting: Hematology and Oncology

## 2014-06-12 ENCOUNTER — Telehealth: Payer: Self-pay | Admitting: Hematology and Oncology

## 2014-06-12 ENCOUNTER — Ambulatory Visit (HOSPITAL_BASED_OUTPATIENT_CLINIC_OR_DEPARTMENT_OTHER): Payer: BC Managed Care – PPO | Admitting: Hematology and Oncology

## 2014-06-12 VITALS — BP 121/64 | HR 90 | Temp 97.5°F | Resp 20 | Ht 68.0 in | Wt 179.3 lb

## 2014-06-12 DIAGNOSIS — R070 Pain in throat: Secondary | ICD-10-CM

## 2014-06-12 DIAGNOSIS — B1920 Unspecified viral hepatitis C without hepatic coma: Secondary | ICD-10-CM

## 2014-06-12 DIAGNOSIS — C099 Malignant neoplasm of tonsil, unspecified: Secondary | ICD-10-CM

## 2014-06-12 NOTE — Progress Notes (Signed)
[facility] OFFICE PROGRESS NOTE  Patient Care Team: Gavin Pound, MD as PCP - General (Family Medicine) Brooks Sailors, RN as Oncology Nurse Navigator (Oncology)  SUMMARY OF ONCOLOGIC HISTORY: Oncology History   Tonsil cancer, Right, HPV positive,    Primary site: Pharynx - Oropharynx   Staging method: AJCC 7th Edition   Clinical: Stage IVA (T1, N2, M0) signed by Heath Lark, MD on 04/17/2014  1:04 PM   Summary: Stage IVA (T1, N2, M0)       Tonsil cancer   03/20/2014 Imaging Ct scan of neck showed several complex solid and cystic lesions within the right neck and abnormalities in the pharynx   03/25/2014 Procedure 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 Multiple hypermetabolic right level 2 lymph nodes compatible with metastatic adenopathy   05/10/2014 Surgery The patient has placement of port and feeding tube.   05/15/2014 -  Chemotherapy He received high dose cisplatin every 3 weeks   05/15/2014 -  Radiation Therapy He received radiation treatment   06/05/2014 Adverse Reaction Cycle 2 of chemotherapy is delayed due to neutropenia.    INTERVAL HISTORY: Please see below for problem oriented charting. He returns today for further followup. His thrush has resolved. He uses liquid morphine as needed for mucositis and throat pain. He has not lost any weight and continues to be everything by mouth.  REVIEW OF SYSTEMS:   Constitutional: Denies fevers, chills or abnormal weight loss Eyes: Denies blurriness of vision Respiratory: Denies cough, dyspnea or wheezes Cardiovascular: Denies palpitation, chest discomfort or lower extremity swelling Gastrointestinal:  Denies nausea, heartburn or change in bowel habits Lymphatics: Denies new lymphadenopathy or easy  bruising Neurological:Denies numbness, tingling or new weaknesses Behavioral/Psych: Mood is stable, no new changes  All other systems were reviewed with the patient and are negative.  I have reviewed the past medical history, past surgical history, social history and family history with the patient and they are unchanged from previous note.  ALLERGIES:  is allergic to sulfa antibiotics.  MEDICATIONS:  Current Outpatient Prescriptions  Medication Sig Dispense Refill  . Alum & Mag Hydroxide-Simeth (MAGIC MOUTHWASH W/LIDOCAINE) SOLN 1 part 2% viscous lidocaine, 1 part H20. Swish and/or swallow 10 mL 30 min before meals and bedtime, up to QID.  480 mL  5  . clobetasol cream (TEMOVATE) 7.84 % Apply 1 application topically daily as needed (Psorasis).       Marland Kitchen lidocaine-prilocaine (EMLA) cream Apply 1 application topically as needed. Apply to [facility] a Cath site one hour prior to needle stick.  30 g  3  . morphine (ROXANOL) 20 MG/ML concentrated solution Take 0.5 mLs (10 mg total) by mouth every 2 (two) hours as needed for severe pain.  120 mL  0  . ondansetron (ZOFRAN) 8 MG tablet Take 1 tablet (8 mg total) by mouth every 8 (eight) hours as needed for nausea or vomiting. Start on the third day after chemotherapy.  30 tablet  1  . polyethylene glycol (MIRALAX) packet Take 17 g by mouth daily.  14 each  0  . prochlorperazine (COMPAZINE) 10 MG tablet Take 1 tablet (10 mg total) by mouth every 6 (six) hours as needed (Nausea or vomiting).  30 tablet  1  . sodium fluoride (FLUORISHIELD) 1.1 % GEL dental gel Instill one drop of fluoride per  tooth space of fluoride tray. Place over teeth for 5 minutes. Remove. Spit out excess. Repeat nightly.  120 mL  prn  . fluconazole (DIFLUCAN) 100 MG tablet Take 2 tablets today, then 1 tablet daily x 20 more days.  22 tablet  0  . tobramycin (TOBREX) 0.3 % ophthalmic solution Place 1 drop into both eyes every 4 (four) hours.  5 mL  0   No current facility-administered  medications for this visit.    PHYSICAL EXAMINATION: ECOG PERFORMANCE STATUS: 1 - Symptomatic but completely ambulatory  Filed Vitals:   06/12/14 1208  BP: 121/64  Pulse: 90  Temp: 97.5 F (36.4 C)  Resp: 20   Filed Weights   06/12/14 1208  Weight: 179 lb 4.8 oz (81.33 kg)    GENERAL:alert, no distress and comfortable SKIN: He has mild radiation-induced skin injury around his neck. No ulceration.  EYES: normal, Conjunctiva are pink and non-injected, sclera clear OROPHARYNX: He has evidence of mucositis. No thrush.  NECK: supple, thyroid normal size, non-tender, without nodularity LYMPH:  He still had palpable lymphadenopathy, small in size.  LUNGS: clear to auscultation and percussion with normal breathing effort HEART: regular rate & rhythm and no murmurs and no lower extremity edema ABDOMEN:abdomen soft, non-tender and normal bowel sounds. Feeding tube site looks okay. Musculoskeletal:no cyanosis of digits and no clubbing  NEURO: alert & oriented x 3 with fluent speech, no focal motor/sensory deficits  LABORATORY DATA:  I have reviewed the data as listed    Component Value Date/Time   NA 139 06/10/2014 1300   NA 139 05/11/2014 0518   K 4.6 06/10/2014 1300   K 4.6 05/11/2014 0518   CL 103 05/11/2014 0518   CO2 31* 06/10/2014 1300   CO2 25 05/11/2014 0518   GLUCOSE 89 06/10/2014 1300   GLUCOSE 127* 05/11/2014 0518   BUN 14.4 06/10/2014 1300   BUN 14 05/11/2014 0518   CREATININE 1.0 06/10/2014 1300   CREATININE 0.87 05/11/2014 0518   CALCIUM 10.0 06/10/2014 1300   CALCIUM 9.0 05/11/2014 0518   PROT 7.6 06/10/2014 1300   PROT 7.3 05/09/2014 1130   ALBUMIN 3.4* 06/10/2014 1300   ALBUMIN 3.6 05/09/2014 1130   AST 37* 06/10/2014 1300   AST 108* 05/09/2014 1130   ALT 84* 06/10/2014 1300   ALT 180* 05/09/2014 1130   ALKPHOS 56 06/10/2014 1300   ALKPHOS 43 05/09/2014 1130   BILITOT 0.47 06/10/2014 1300   BILITOT 0.4 05/09/2014 1130   GFRNONAA >90 05/11/2014 0518   GFRAA >90 05/11/2014 0518     No results found for this basename: SPEP, UPEP,  kappa and lambda light chains    Lab Results  Component Value Date   WBC 5.7 06/10/2014   NEUTROABS 3.5 06/10/2014   HGB 13.7 06/10/2014   HCT 39.7 06/10/2014   MCV 90.0 06/10/2014   PLT 381 06/10/2014      Chemistry      Component Value Date/Time   NA 139 06/10/2014 1300   NA 139 05/11/2014 0518   K 4.6 06/10/2014 1300   K 4.6 05/11/2014 0518   CL 103 05/11/2014 0518   CO2 31* 06/10/2014 1300   CO2 25 05/11/2014 0518   BUN 14.4 06/10/2014 1300   BUN 14 05/11/2014 0518   CREATININE 1.0 06/10/2014 1300   CREATININE 0.87 05/11/2014 0518      Component Value Date/Time   CALCIUM 10.0 06/10/2014 1300   CALCIUM 9.0 05/11/2014 0518   ALKPHOS 56 06/10/2014 1300  ALKPHOS 43 05/09/2014 1130   AST 37* 06/10/2014 1300   AST 108* 05/09/2014 1130   ALT 84* 06/10/2014 1300   ALT 180* 05/09/2014 1130   BILITOT 0.47 06/10/2014 1300   BILITOT 0.4 05/09/2014 1130     ASSESSMENT & PLAN:  Tonsil cancer His blood count has recently recovered. He tolerated cycle 2 of treatment without problem. I continue to encourage to patient that overall he is improving. We'll continue to see him on a weekly basis to provide supportive care.  Throat pain I recommended he use Magic mouthwash. I encourage patient to use prescription liquid morphine as needed if the pain is not controlled with Magic mouthwash.      Hepatitis C This is likely related to a flare of hepatitis C. Continue to monitor closely.     No orders of the defined types were placed in this encounter.   All questions were answered. The patient knows to call the clinic with any problems, questions or concerns. No barriers to learning was detected. I spent 15 minutes counseling the patient face to face. The total time spent in the appointment was 20 minutes and more than 50% was on counseling and review of test results     [facility], Shenika Quint, MD 06/12/2014 5:33 PM

## 2014-06-12 NOTE — Assessment & Plan Note (Signed)
His blood count has recently recovered. He tolerated cycle 2 of treatment without problem. I continue to encourage to patient that overall he is improving. We'll continue to see him on a weekly basis to provide supportive care.

## 2014-06-12 NOTE — Telephone Encounter (Signed)
Pt confirmed labs/ov per 07/29 POF, gave pt AVS...KJ °

## 2014-06-12 NOTE — Assessment & Plan Note (Signed)
This is likely related to a flare of hepatitis C. Continue to monitor closely.

## 2014-06-12 NOTE — Assessment & Plan Note (Signed)
I recommended he use Magic mouthwash. I encourage patient to use prescription liquid morphine as needed if the pain is not controlled with Magic mouthwash.

## 2014-06-13 ENCOUNTER — Ambulatory Visit
Admission: RE | Admit: 2014-06-13 | Discharge: 2014-06-13 | Disposition: A | Discharge status: Home / Self Care | Payer: BC Managed Care – PPO | Source: Ambulatory Visit | Attending: Radiation Oncology | Admitting: Radiation Oncology

## 2014-06-13 DIAGNOSIS — C099 Malignant neoplasm of tonsil, unspecified: Secondary | ICD-10-CM | POA: Diagnosis not present

## 2014-06-14 ENCOUNTER — Ambulatory Visit
Admission: RE | Admit: 2014-06-14 | Discharge: 2014-06-14 | Disposition: A | Discharge status: Home / Self Care | Payer: BC Managed Care – PPO | Source: Ambulatory Visit | Attending: Radiation Oncology | Admitting: Radiation Oncology

## 2014-06-14 DIAGNOSIS — C099 Malignant neoplasm of tonsil, unspecified: Secondary | ICD-10-CM | POA: Diagnosis not present

## 2014-06-17 ENCOUNTER — Ambulatory Visit
Admission: RE | Admit: 2014-06-17 | Discharge: 2014-06-17 | Disposition: A | Discharge status: Home / Self Care | Payer: BC Managed Care – PPO | Source: Ambulatory Visit | Attending: Radiation Oncology | Admitting: Radiation Oncology

## 2014-06-17 ENCOUNTER — Ambulatory Visit (HOSPITAL_BASED_OUTPATIENT_CLINIC_OR_DEPARTMENT_OTHER): Payer: BC Managed Care – PPO

## 2014-06-17 ENCOUNTER — Encounter: Payer: Self-pay | Admitting: Radiation Oncology

## 2014-06-17 VITALS — BP 92/64 | HR 128 | Temp 98.2°F | Ht 68.0 in | Wt 175.0 lb

## 2014-06-17 VITALS — BP 110/67 | HR 89 | Temp 98.4°F | Resp 17

## 2014-06-17 DIAGNOSIS — Z5189 Encounter for other specified aftercare: Secondary | ICD-10-CM

## 2014-06-17 DIAGNOSIS — C099 Malignant neoplasm of tonsil, unspecified: Secondary | ICD-10-CM | POA: Diagnosis not present

## 2014-06-17 MED ORDER — SODIUM CHLORIDE 0.9 % IV SOLN
INTRAVENOUS | Status: DC
  Administered 2014-06-17: 15:00:00 via INTRAVENOUS

## 2014-06-17 MED ORDER — BIAFINE EX EMUL
Freq: Two times a day (BID) | CUTANEOUS | Status: DC
  Administered 2014-06-17: 14:00:00 via TOPICAL

## 2014-06-17 MED ORDER — SUCRALFATE 1 G PO TABS: ORAL_TABLET | ORAL | Status: DC

## 2014-06-17 MED ORDER — HEPARIN SOD (PORK) LOCK FLUSH 100 UNIT/ML IV SOLN
500.0000 [IU] | Freq: Once | INTRAVENOUS | Status: AC
  Administered 2014-06-17: 500 [IU] via INTRAVENOUS
  Filled 2014-06-17: qty 5

## 2014-06-17 MED ORDER — SODIUM CHLORIDE 0.9 % IJ SOLN
10.0000 mL | INTRAMUSCULAR | Status: DC | PRN
  Administered 2014-06-17: 10 mL via INTRAVENOUS
  Filled 2014-06-17: qty 10

## 2014-06-17 MED ORDER — LIDOCAINE-PRILOCAINE 2.5-2.5 % EX CREA
TOPICAL_CREAM | Freq: Once | CUTANEOUS | Status: AC
  Administered 2014-06-17: 15:00:00 via TOPICAL
  Filled 2014-06-17: qty 5

## 2014-06-17 NOTE — Patient Instructions (Signed)
Dehydration, Adult Dehydration is when you lose more fluids from the body than you take in. Vital organs like the kidneys, brain, and heart cannot function without a proper amount of fluids and salt. Any loss of fluids from the body can cause dehydration.  CAUSES   Vomiting.  Diarrhea.  Excessive sweating.  Excessive urine output.  Fever. SYMPTOMS  Mild dehydration  Thirst.  Dry lips.  Slightly dry mouth. Moderate dehydration  Very dry mouth.  Sunken eyes.  Skin does not bounce back quickly when lightly pinched and released.  Dark urine and decreased urine production.  Decreased tear production.  Headache. Severe dehydration  Very dry mouth.  Extreme thirst.  Rapid, weak pulse (more than 100 beats per minute at rest).  Cold hands and feet.  Not able to sweat in spite of heat and temperature.  Rapid breathing.  Blue lips.  Confusion and lethargy.  Difficulty being awakened.  Minimal urine production.  No tears. DIAGNOSIS  Your caregiver will diagnose dehydration based on your symptoms and your exam. Blood and urine tests will help confirm the diagnosis. The diagnostic evaluation should also identify the cause of dehydration. TREATMENT  Treatment of mild or moderate dehydration can often be done at home by increasing the amount of fluids that you drink. It is best to drink small amounts of fluid more often. Drinking too much at one time can make vomiting worse. Refer to the home care instructions below. Severe dehydration needs to be treated at the hospital where you will probably be given intravenous (IV) fluids that contain water and electrolytes. HOME CARE INSTRUCTIONS   Ask your caregiver about specific rehydration instructions.  Drink enough fluids to keep your urine clear or pale yellow.  Drink small amounts frequently if you have nausea and vomiting.  Eat as you normally do.  Avoid:  Foods or drinks high in sugar.  Carbonated  drinks.  Juice.  Extremely hot or cold fluids.  Drinks with caffeine.  Fatty, greasy foods.  Alcohol.  Tobacco.  Overeating.  Gelatin desserts.  Wash your hands well to avoid spreading bacteria and viruses.  Only take over-the-counter or prescription medicines for pain, discomfort, or fever as directed by your caregiver.  Ask your caregiver if you should continue all prescribed and over-the-counter medicines.  Keep all follow-up appointments with your caregiver. SEEK MEDICAL CARE IF:  You have abdominal pain and it increases or stays in one area (localizes).  You have a rash, stiff neck, or severe headache.  You are irritable, sleepy, or difficult to awaken.  You are weak, dizzy, or extremely thirsty. SEEK IMMEDIATE MEDICAL CARE IF:   You are unable to keep fluids down or you get worse despite treatment.  You have frequent episodes of vomiting or diarrhea.  You have blood or green matter (bile) in your vomit.  You have blood in your stool or your stool looks black and tarry.  You have not urinated in 6 to 8 hours, or you have only urinated a small amount of very dark urine.  You have a fever.  You faint. MAKE SURE YOU:   Understand these instructions.  Will watch your condition.  Will get help right away if you are not doing well or get worse. Document Released: 11/01/2005 Document Revised: 01/24/2012 Document Reviewed: 06/21/2011 ExitCare Patient Information 2015 [employer]. This information is not intended to replace advice given to you by your health care provider. Make sure you discuss any questions you have with your health care   provider.  

## 2014-06-17 NOTE — Progress Notes (Signed)
Christopher Mullins has received 21 fractions to his right tonsil and bilateral neck. Note mucositis in the uvula region and the soft palate.  He c/o pain in his throat as a level 7-8/10.   Skin on neck is red with the beginning of dry desquamation.  Given another tube of Biafine.  Given Gel pads to apply to skin on neck.  Orthostatic vitals noted today.  Note fatigue today and he states that.  He has lost 3 lbs since 06/12/14.

## 2014-06-18 ENCOUNTER — Ambulatory Visit
Admission: RE | Admit: 2014-06-18 | Discharge: 2014-06-18 | Disposition: A | Discharge status: Home / Self Care | Payer: BC Managed Care – PPO | Source: Ambulatory Visit | Attending: Radiation Oncology | Admitting: Radiation Oncology

## 2014-06-18 DIAGNOSIS — C099 Malignant neoplasm of tonsil, unspecified: Secondary | ICD-10-CM | POA: Diagnosis not present

## 2014-06-18 NOTE — Progress Notes (Signed)
Weekly Management Note:  Outpatient Current Dose:  44 Gy  Projected Dose: 70 Gy    ICD-9-CM   1. Tonsil cancer 146.0 sucralfate (CARAFATE) 1 G tablet    lidocaine-prilocaine (EMLA) cream    DISCONTINUED: 0.9 %  sodium chloride infusion    DISCONTINUED: topical emolient (BIAFINE) emulsion    Narrative:  The patient presents for routine under treatment assessment.  CBCT/MVCT images/Port film x-rays were reviewed.  The chart was checked. Main complaint is burning sore skin over neck. Skin dry, red.  May be taking in less by mouth and acknowledge he could use his PEG tube a lot more.  A little dizzy. He c/o pain in his throat as a level 7-8/10 but not taking morphine regularly.  Orthostatic vitals noted today. Note fatigue today and he states that. He has lost 3 lbs since 06/12/14   Physical Findings:  height is 5\' 8"  (1.727 m) and weight is 175 lb (79.379 kg). His temperature is 98.2 F (36.8 C). His blood pressure is 92/64 and his pulse is 128.  Soft palate - confluent mucositis. Right neck mass visibly smaller than 2 weeks ago. Skin dry and erythematous over neck  CBC    Component Value Date/Time   WBC 5.7 06/10/2014 1300   WBC 8.7 05/11/2014 0518   RBC 4.41 06/10/2014 1300   RBC 4.11* 05/11/2014 0518   HGB 13.7 06/10/2014 1300   HGB 12.7* 05/11/2014 0518   HCT 39.7 06/10/2014 1300   HCT 37.3* 05/11/2014 0518   PLT 381 06/10/2014 1300   PLT 180 05/11/2014 0518   MCV 90.0 06/10/2014 1300   MCV 90.8 05/11/2014 0518   MCH 31.1 06/10/2014 1300   MCH 30.9 05/11/2014 0518   MCHC 34.5 06/10/2014 1300   MCHC 34.0 05/11/2014 0518   RDW 12.6 06/10/2014 1300   RDW 12.7 05/11/2014 0518   LYMPHSABS 0.9 06/10/2014 1300   MONOABS 1.3* 06/10/2014 1300   EOSABS 0.1 06/10/2014 1300   BASOSABS 0.0 06/10/2014 1300     CMP     Component Value Date/Time   NA 139 06/10/2014 1300   NA 139 05/11/2014 0518   K 4.6 06/10/2014 1300   K 4.6 05/11/2014 0518   CL 103 05/11/2014 0518   CO2 31* 06/10/2014 1300   CO2 25  05/11/2014 0518   GLUCOSE 89 06/10/2014 1300   GLUCOSE 127* 05/11/2014 0518   BUN 14.4 06/10/2014 1300   BUN 14 05/11/2014 0518   CREATININE 1.0 06/10/2014 1300   CREATININE 0.87 05/11/2014 0518   CALCIUM 10.0 06/10/2014 1300   CALCIUM 9.0 05/11/2014 0518   PROT 7.6 06/10/2014 1300   PROT 7.3 05/09/2014 1130   ALBUMIN 3.4* 06/10/2014 1300   ALBUMIN 3.6 05/09/2014 1130   AST 37* 06/10/2014 1300   AST 108* 05/09/2014 1130   ALT 84* 06/10/2014 1300   ALT 180* 05/09/2014 1130   ALKPHOS 56 06/10/2014 1300   ALKPHOS 43 05/09/2014 1130   BILITOT 0.47 06/10/2014 1300   BILITOT 0.4 05/09/2014 1130   GFRNONAA >90 05/11/2014 0518   GFRAA >90 05/11/2014 0518     Impression:  The patient is tolerating radiotherapy.   Plan:  Continue radiotherapy as planned. Given more Biafine and Gel pads to apply to skin on neck. Encouraged to continue PO intake and start taking in more via PEG for nutrition, hydration, weight maintenance. Increase morphine frequency as needed for pain.Pt will try sucralfate too. Ordering IV fluids for today due to orthostatics and dehydration.  -----------------------------------  Hettie Roselli, MD 

## 2014-06-19 ENCOUNTER — Ambulatory Visit
Admission: RE | Admit: 2014-06-19 | Discharge: 2014-06-19 | Disposition: A | Discharge status: Home / Self Care | Payer: BC Managed Care – PPO | Source: Ambulatory Visit | Attending: Radiation Oncology | Admitting: Radiation Oncology

## 2014-06-19 ENCOUNTER — Telehealth: Payer: Self-pay | Admitting: Hematology and Oncology

## 2014-06-19 ENCOUNTER — Telehealth: Payer: Self-pay | Admitting: *Deleted

## 2014-06-19 ENCOUNTER — Ambulatory Visit: Payer: BC Managed Care – PPO | Admitting: Nutrition

## 2014-06-19 ENCOUNTER — Ambulatory Visit (HOSPITAL_BASED_OUTPATIENT_CLINIC_OR_DEPARTMENT_OTHER): Payer: BC Managed Care – PPO

## 2014-06-19 ENCOUNTER — Encounter: Payer: Self-pay | Admitting: Hematology and Oncology

## 2014-06-19 ENCOUNTER — Encounter: Payer: Self-pay | Admitting: *Deleted

## 2014-06-19 ENCOUNTER — Ambulatory Visit (HOSPITAL_BASED_OUTPATIENT_CLINIC_OR_DEPARTMENT_OTHER): Payer: BC Managed Care – PPO | Admitting: Hematology and Oncology

## 2014-06-19 VITALS — BP 99/62 | HR 99 | Temp 98.3°F | Resp 19 | Ht 68.0 in | Wt 176.1 lb

## 2014-06-19 DIAGNOSIS — K1231 Oral mucositis (ulcerative) due to antineoplastic therapy: Secondary | ICD-10-CM

## 2014-06-19 DIAGNOSIS — C099 Malignant neoplasm of tonsil, unspecified: Secondary | ICD-10-CM | POA: Diagnosis not present

## 2014-06-19 DIAGNOSIS — I959 Hypotension, unspecified: Secondary | ICD-10-CM

## 2014-06-19 DIAGNOSIS — R070 Pain in throat: Secondary | ICD-10-CM

## 2014-06-19 DIAGNOSIS — E860 Dehydration: Secondary | ICD-10-CM

## 2014-06-19 DIAGNOSIS — Z931 Gastrostomy status: Secondary | ICD-10-CM

## 2014-06-19 DIAGNOSIS — R21 Rash and other nonspecific skin eruption: Secondary | ICD-10-CM

## 2014-06-19 DIAGNOSIS — R634 Abnormal weight loss: Secondary | ICD-10-CM

## 2014-06-19 MED ORDER — SODIUM CHLORIDE 0.9 % IJ SOLN
10.0000 mL | INTRAMUSCULAR | Status: DC | PRN
  Administered 2014-06-19: 10 mL
  Filled 2014-06-19: qty 10

## 2014-06-19 MED ORDER — HYDROMORPHONE HCL PF 4 MG/ML IJ SOLN
INTRAMUSCULAR | Status: AC
  Filled 2014-06-19: qty 1

## 2014-06-19 MED ORDER — SODIUM CHLORIDE 0.9 % IV SOLN
Freq: Once | INTRAVENOUS | Status: AC
  Administered 2014-06-19: 14:00:00 via INTRAVENOUS

## 2014-06-19 MED ORDER — HEPARIN SOD (PORK) LOCK FLUSH 100 UNIT/ML IV SOLN
500.0000 [IU] | Freq: Once | INTRAVENOUS | Status: AC | PRN
Start: 2014-06-19 — End: 2014-06-19
  Administered 2014-06-19: 500 [IU]
  Filled 2014-06-19: qty 5

## 2014-06-19 MED ORDER — HYDROMORPHONE HCL PF 4 MG/ML IJ SOLN
2.0000 mg | Freq: Once | INTRAMUSCULAR | Status: AC
  Administered 2014-06-19: 2 mg via INTRAVENOUS

## 2014-06-19 MED ORDER — FENTANYL 25 MCG/HR TD PT72: 25.0000 ug | MEDICATED_PATCH | TRANSDERMAL | Status: DC

## 2014-06-19 NOTE — Assessment & Plan Note (Signed)
He is mildly hypotensive and I am concerned about dehydration. The patient is very motivated to increase oral hydration. I told him to substitute water with Gatorade and nutritional supplements. In addition, I am also adding IV fluids daily to prevent risk of kidney injury.

## 2014-06-19 NOTE — Patient Instructions (Signed)
Dehydration, Adult Dehydration is when you lose more fluids from the body than you take in. Vital organs like the kidneys, brain, and heart cannot function without a proper amount of fluids and salt. Any loss of fluids from the body can cause dehydration.  CAUSES   Vomiting.  Diarrhea.  Excessive sweating.  Excessive urine output.  Fever. SYMPTOMS  Mild dehydration  Thirst.  Dry lips.  Slightly dry mouth. Moderate dehydration  Very dry mouth.  Sunken eyes.  Skin does not bounce back quickly when lightly pinched and released.  Dark urine and decreased urine production.  Decreased tear production.  Headache. Severe dehydration  Very dry mouth.  Extreme thirst.  Rapid, weak pulse (more than 100 beats per minute at rest).  Cold hands and feet.  Not able to sweat in spite of heat and temperature.  Rapid breathing.  Blue lips.  Confusion and lethargy.  Difficulty being awakened.  Minimal urine production.  No tears. DIAGNOSIS  Your caregiver will diagnose dehydration based on your symptoms and your exam. Blood and urine tests will help confirm the diagnosis. The diagnostic evaluation should also identify the cause of dehydration. TREATMENT  Treatment of mild or moderate dehydration can often be done at home by increasing the amount of fluids that you drink. It is best to drink small amounts of fluid more often. Drinking too much at one time can make vomiting worse. Refer to the home care instructions below. Severe dehydration needs to be treated at the hospital where you will probably be given intravenous (IV) fluids that contain water and electrolytes. HOME CARE INSTRUCTIONS   Ask your caregiver about specific rehydration instructions.  Drink enough fluids to keep your urine clear or pale yellow.  Drink small amounts frequently if you have nausea and vomiting.  Eat as you normally do.  Avoid:  Foods or drinks high in sugar.  Carbonated  drinks.  Juice.  Extremely hot or cold fluids.  Drinks with caffeine.  Fatty, greasy foods.  Alcohol.  Tobacco.  Overeating.  Gelatin desserts.  Wash your hands well to avoid spreading bacteria and viruses.  Only take over-the-counter or prescription medicines for pain, discomfort, or fever as directed by your caregiver.  Ask your caregiver if you should continue all prescribed and over-the-counter medicines.  Keep all follow-up appointments with your caregiver. SEEK MEDICAL CARE IF:  You have abdominal pain and it increases or stays in one area (localizes).  You have a rash, stiff neck, or severe headache.  You are irritable, sleepy, or difficult to awaken.  You are weak, dizzy, or extremely thirsty. SEEK IMMEDIATE MEDICAL CARE IF:   You are unable to keep fluids down or you get worse despite treatment.  You have frequent episodes of vomiting or diarrhea.  You have blood or green matter (bile) in your vomit.  You have blood in your stool or your stool looks black and tarry.  You have not urinated in 6 to 8 hours, or you have only urinated a small amount of very dark urine.  You have a fever.  You faint. MAKE SURE YOU:   Understand these instructions.  Will watch your condition.  Will get help right away if you are not doing well or get worse. Document Released: 11/01/2005 Document Revised: 01/24/2012 Document Reviewed: 06/21/2011 ExitCare Patient Information 2015 [employer]. This information is not intended to replace advice given to you by your health care provider. Make sure you discuss any questions you have with your health care   provider.  

## 2014-06-19 NOTE — Progress Notes (Signed)
[facility] OFFICE PROGRESS NOTE  Patient Care Team: Gavin Pound, MD as PCP - General (Family Medicine) Brooks Sailors, RN as Oncology Nurse Navigator (Oncology)  SUMMARY OF ONCOLOGIC HISTORY: Oncology History   Tonsil cancer, Right, HPV positive,    Primary site: Pharynx - Oropharynx   Staging method: AJCC 7th Edition   Clinical: Stage IVA (T1, N2, M0) signed by Heath Lark, MD on 04/17/2014  1:04 PM   Summary: Stage IVA (T1, N2, M0)       Tonsil cancer   03/20/2014 Imaging Ct scan of neck showed several complex solid and cystic lesions within the right neck and abnormalities in the pharynx   03/25/2014 Procedure 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 Multiple hypermetabolic right level 2 lymph nodes compatible with metastatic adenopathy   05/10/2014 Surgery The patient has placement of port and feeding tube.   05/15/2014 -  Chemotherapy He received high dose cisplatin every 3 weeks   05/15/2014 -  Radiation Therapy He received radiation treatment   06/05/2014 Adverse Reaction Cycle 2 of chemotherapy is delayed due to neutropenia.    INTERVAL HISTORY: Please see below for problem oriented charting. He is seen for his weekly supportive care visits. He has worsening pain control. He is rating his throat pain is 7/10 pain. He had lost some weight due to difficulties with eating. He also complained of occasional dizziness.  REVIEW OF SYSTEMS:   Constitutional: Denies fevers, chills Eyes: Denies blurriness of vision Respiratory: Denies cough, dyspnea or wheezes Cardiovascular: Denies palpitation, chest discomfort or lower extremity swelling Gastrointestinal:  Denies nausea, heartburn or change in bowel habits Lymphatics: Denies new lymphadenopathy or easy  bruising Neurological:Denies numbness, tingling or new weaknesses Behavioral/Psych: Mood is stable, no new changes  All other systems were reviewed with the patient and are negative.  I have reviewed the past medical history, past surgical history, social history and family history with the patient and they are unchanged from previous note.  ALLERGIES:  is allergic to sulfa antibiotics.  MEDICATIONS:  Current Outpatient Prescriptions  Medication Sig Dispense Refill  . Alum & Mag Hydroxide-Simeth (MAGIC MOUTHWASH W/LIDOCAINE) SOLN 1 part 2% viscous lidocaine, 1 part H20. Swish and/or swallow 10 mL 30 min before meals and bedtime, up to QID.  480 mL  5  . clobetasol cream (TEMOVATE) 4.09 % Apply 1 application topically daily as needed (Psorasis).       . fluconazole (DIFLUCAN) 100 MG tablet Take 2 tablets today, then 1 tablet daily x 20 more days.  22 tablet  0  . lidocaine-prilocaine (EMLA) cream Apply 1 application topically as needed. Apply to [facility] a Cath site one hour prior to needle stick.  30 g  3  . morphine (ROXANOL) 20 MG/ML concentrated solution Take 0.5 mLs (10 mg total) by mouth every 2 (two) hours as needed for severe pain.  120 mL  0  . ondansetron (ZOFRAN) 8 MG tablet Take 1 tablet (8 mg total) by mouth every 8 (eight) hours as needed for nausea or vomiting. Start on the third day after chemotherapy.  30 tablet  1  . polyethylene glycol (MIRALAX) packet Take 17 g by mouth daily.  14 each  0  . prochlorperazine (COMPAZINE) 10 MG tablet Take 1 tablet (10 mg total) by mouth every 6 (six) hours as  needed (Nausea or vomiting).  30 tablet  1  . sodium fluoride (FLUORISHIELD) 1.1 % GEL dental gel Instill one drop of fluoride per tooth space of fluoride tray. Place over teeth for 5 minutes. Remove. Spit out excess. Repeat nightly.  120 mL  prn  . sucralfate (CARAFATE) 1 G tablet Dissolve 1 tablet in 10 mL H20 and swallow up to 4 times daily for sore throat  30 tablet  5  . tobramycin  (TOBREX) 0.3 % ophthalmic solution Place 1 drop into both eyes every 4 (four) hours.  5 mL  0  . fentaNYL (DURAGESIC - DOSED MCG/HR) 25 MCG/HR patch Place 1 patch (25 mcg total) onto the skin every 3 (three) days.  5 patch  0   No current facility-administered medications for this visit.    PHYSICAL EXAMINATION: ECOG PERFORMANCE STATUS: 1 - Symptomatic but completely ambulatory  Filed Vitals:   06/19/14 1246  BP: 99/62  Pulse: 99  Temp: 98.3 F (36.8 C)  Resp: 19   Filed Weights   06/19/14 1246  Weight: 176 lb 1.6 oz (79.878 kg)    GENERAL:alert, no distress and comfortable SKIN: Significant radiation-induced skin injury around his neck. No ulceration. EYES: normal, Conjunctiva are pink and non-injected, sclera clear OROPHARYNX: Significant mucositis with ulceration. No thrush. Dry mucous membranes noted. NECK: supple, thyroid normal size, non-tender, without nodularity LYMPH:  Persistent palpable lymphadenopathy in his neck.  LUNGS: clear to auscultation and percussion with normal breathing effort HEART: regular rate & rhythm and no murmurs and no lower extremity edema ABDOMEN:abdomen soft, non-tender and normal bowel sounds. Feeding tube site looks okay Musculoskeletal:no cyanosis of digits and no clubbing  NEURO: alert & oriented x 3 with fluent speech, no focal motor/sensory deficits  LABORATORY DATA:  I have reviewed the data as listed    Component Value Date/Time   NA 139 06/10/2014 1300   NA 139 05/11/2014 0518   K 4.6 06/10/2014 1300   K 4.6 05/11/2014 0518   CL 103 05/11/2014 0518   CO2 31* 06/10/2014 1300   CO2 25 05/11/2014 0518   GLUCOSE 89 06/10/2014 1300   GLUCOSE 127* 05/11/2014 0518   BUN 14.4 06/10/2014 1300   BUN 14 05/11/2014 0518   CREATININE 1.0 06/10/2014 1300   CREATININE 0.87 05/11/2014 0518   CALCIUM 10.0 06/10/2014 1300   CALCIUM 9.0 05/11/2014 0518   PROT 7.6 06/10/2014 1300   PROT 7.3 05/09/2014 1130   ALBUMIN 3.4* 06/10/2014 1300   ALBUMIN 3.6  05/09/2014 1130   AST 37* 06/10/2014 1300   AST 108* 05/09/2014 1130   ALT 84* 06/10/2014 1300   ALT 180* 05/09/2014 1130   ALKPHOS 56 06/10/2014 1300   ALKPHOS 43 05/09/2014 1130   BILITOT 0.47 06/10/2014 1300   BILITOT 0.4 05/09/2014 1130   GFRNONAA >90 05/11/2014 0518   GFRAA >90 05/11/2014 0518    No results found for this basename: SPEP, UPEP,  kappa and lambda light chains    Lab Results  Component Value Date   WBC 5.7 06/10/2014   NEUTROABS 3.5 06/10/2014   HGB 13.7 06/10/2014   HCT 39.7 06/10/2014   MCV 90.0 06/10/2014   PLT 381 06/10/2014      Chemistry      Component Value Date/Time   NA 139 06/10/2014 1300   NA 139 05/11/2014 0518   K 4.6 06/10/2014 1300   K 4.6 05/11/2014 0518   CL 103 05/11/2014 0518   CO2 31* 06/10/2014 1300  CO2 25 05/11/2014 0518   BUN 14.4 06/10/2014 1300   BUN 14 05/11/2014 0518   CREATININE 1.0 06/10/2014 1300   CREATININE 0.87 05/11/2014 0518      Component Value Date/Time   CALCIUM 10.0 06/10/2014 1300   CALCIUM 9.0 05/11/2014 0518   ALKPHOS 56 06/10/2014 1300   ALKPHOS 43 05/09/2014 1130   AST 37* 06/10/2014 1300   AST 108* 05/09/2014 1130   ALT 84* 06/10/2014 1300   ALT 180* 05/09/2014 1130   BILITOT 0.47 06/10/2014 1300   BILITOT 0.4 05/09/2014 1130      ASSESSMENT & PLAN:  Tonsil cancer His blood count has recently recovered. He tolerated cycle 2 of treatment without problem apart from expected minor side effects. We'll continue to see him on a weekly basis to provide supportive care.    Unintentional weight loss This is due to difficulties with eating due to to worsening throat pain. Continue to encourage him to increase his tube feeds as tolerated.  Throat pain This is getting worse. I will add on fentanyl patch but continue to encourage him to use liquid morphine sulfate as needed for breakthrough pain.  S/P percutaneous endoscopic gastrostomy (PEG) tube placement His feeding tube looks okay with no signs of infection.  Rash This is  due to radiation induced skin injury. I recommended topical emollient cream    Mucositis (ulcerative) due to antineoplastic therapy I recommend conservative management. I will increase his pain medications as noted above.  Dehydration He is mildly hypotensive and I am concerned about dehydration. The patient is very motivated to increase oral hydration. I told him to substitute water with Gatorade and nutritional supplements. In addition, I am also adding IV fluids daily to prevent risk of kidney injury.      All questions were answered. The patient knows to call the clinic with any problems, questions or concerns. No barriers to learning was detected. I spent 30 minutes counseling the patient face to face. The total time spent in the appointment was 40 minutes and more than 50% was on counseling and review of test results     [facility], [place], MD 06/19/2014 9:07 PM

## 2014-06-19 NOTE — Telephone Encounter (Signed)
Per POF staff message scheduled appts. Advised scheduler 

## 2014-06-19 NOTE — Assessment & Plan Note (Signed)
This is due to radiation induced skin injury. I recommended topical emollient cream

## 2014-06-19 NOTE — Assessment & Plan Note (Signed)
This is due to difficulties with eating due to to worsening throat pain. Continue to encourage him to increase his tube feeds as tolerated.

## 2014-06-19 NOTE — Assessment & Plan Note (Signed)
His blood count has recently recovered. He tolerated cycle 2 of treatment without problem apart from expected minor side effects. We'll continue to see him on a weekly basis to provide supportive care.

## 2014-06-19 NOTE — Assessment & Plan Note (Signed)
His feeding tube looks okay with no signs of infection.

## 2014-06-19 NOTE — Progress Notes (Signed)
Spoke briefly to patient.  He reports he is doing well.  Although today is not a great day.  Patient denies problems with tube feeding.  Weight documented as 176.1 pounds today has stabilized over the past week.  Provided patient with additional samples of Osmolite 1.5 per his request.  Followup on Wednesday, August 12.

## 2014-06-19 NOTE — Telephone Encounter (Signed)
Pt confirmed labs/ov per 08/05 POF, gave pt AVS....KJ °

## 2014-06-19 NOTE — Assessment & Plan Note (Signed)
This is getting worse. I will add on fentanyl patch but continue to encourage him to use liquid morphine sulfate as needed for breakthrough pain.

## 2014-06-19 NOTE — Assessment & Plan Note (Signed)
I recommend conservative management. I will increase his pain medications as noted above.

## 2014-06-20 ENCOUNTER — Encounter: Payer: Self-pay | Admitting: *Deleted

## 2014-06-20 ENCOUNTER — Other Ambulatory Visit: Payer: Self-pay | Admitting: *Deleted

## 2014-06-20 ENCOUNTER — Ambulatory Visit (HOSPITAL_BASED_OUTPATIENT_CLINIC_OR_DEPARTMENT_OTHER): Payer: BC Managed Care – PPO

## 2014-06-20 ENCOUNTER — Ambulatory Visit
Admission: RE | Admit: 2014-06-20 | Discharge: 2014-06-20 | Disposition: A | Discharge status: Home / Self Care | Payer: BC Managed Care – PPO | Source: Ambulatory Visit | Attending: Radiation Oncology | Admitting: Radiation Oncology

## 2014-06-20 VITALS — BP 109/65 | HR 89 | Temp 98.6°F | Resp 18

## 2014-06-20 DIAGNOSIS — R070 Pain in throat: Secondary | ICD-10-CM

## 2014-06-20 DIAGNOSIS — E860 Dehydration: Secondary | ICD-10-CM

## 2014-06-20 DIAGNOSIS — C099 Malignant neoplasm of tonsil, unspecified: Secondary | ICD-10-CM | POA: Diagnosis not present

## 2014-06-20 DIAGNOSIS — I959 Hypotension, unspecified: Secondary | ICD-10-CM

## 2014-06-20 MED ORDER — SODIUM CHLORIDE 0.9 % IJ SOLN
10.0000 mL | INTRAMUSCULAR | Status: DC | PRN
  Administered 2014-06-20: 10 mL
  Filled 2014-06-20: qty 10

## 2014-06-20 MED ORDER — HYDROMORPHONE HCL PF 4 MG/ML IJ SOLN
INTRAMUSCULAR | Status: AC
  Filled 2014-06-20: qty 1

## 2014-06-20 MED ORDER — HYDROMORPHONE HCL PF 2 MG/ML IJ SOLN
2.0000 mg | Freq: Every day | INTRAMUSCULAR | Status: DC | PRN
  Administered 2014-06-20: 2 mg via INTRAVENOUS
  Filled 2014-06-20: qty 1

## 2014-06-20 MED ORDER — SODIUM CHLORIDE 0.9 % IV SOLN
1000.0000 mL | Freq: Once | INTRAVENOUS | Status: AC
  Administered 2014-06-20: 1000 mL via INTRAVENOUS

## 2014-06-20 MED ORDER — HYDROMORPHONE HCL PF 1 MG/ML IJ SOLN: 2.0000 mg | Freq: Every day | INTRAMUSCULAR | Status: DC | PRN

## 2014-06-20 MED ORDER — HEPARIN SOD (PORK) LOCK FLUSH 100 UNIT/ML IV SOLN
500.0000 [IU] | Freq: Once | INTRAVENOUS | Status: AC | PRN
Start: 2014-06-20 — End: 2014-06-20
  Administered 2014-06-20: 500 [IU]
  Filled 2014-06-20: qty 5

## 2014-06-20 NOTE — Patient Instructions (Signed)
Dehydration, Adult Dehydration is when you lose more fluids from the body than you take in. Vital organs like the kidneys, brain, and heart cannot function without a proper amount of fluids and salt. Any loss of fluids from the body can cause dehydration.  CAUSES   Vomiting.  Diarrhea.  Excessive sweating.  Excessive urine output.  Fever. SYMPTOMS  Mild dehydration  Thirst.  Dry lips.  Slightly dry mouth. Moderate dehydration  Very dry mouth.  Sunken eyes.  Skin does not bounce back quickly when lightly pinched and released.  Dark urine and decreased urine production.  Decreased tear production.  Headache. Severe dehydration  Very dry mouth.  Extreme thirst.  Rapid, weak pulse (more than 100 beats per minute at rest).  Cold hands and feet.  Not able to sweat in spite of heat and temperature.  Rapid breathing.  Blue lips.  Confusion and lethargy.  Difficulty being awakened.  Minimal urine production.  No tears. DIAGNOSIS  Your caregiver will diagnose dehydration based on your symptoms and your exam. Blood and urine tests will help confirm the diagnosis. The diagnostic evaluation should also identify the cause of dehydration. TREATMENT  Treatment of mild or moderate dehydration can often be done at home by increasing the amount of fluids that you drink. It is best to drink small amounts of fluid more often. Drinking too much at one time can make vomiting worse. Refer to the home care instructions below. Severe dehydration needs to be treated at the hospital where you will probably be given intravenous (IV) fluids that contain water and electrolytes. HOME CARE INSTRUCTIONS   Ask your caregiver about specific rehydration instructions.  Drink enough fluids to keep your urine clear or pale yellow.  Drink small amounts frequently if you have nausea and vomiting.  Eat as you normally do.  Avoid:  Foods or drinks high in sugar.  Carbonated  drinks.  Juice.  Extremely hot or cold fluids.  Drinks with caffeine.  Fatty, greasy foods.  Alcohol.  Tobacco.  Overeating.  Gelatin desserts.  Wash your hands well to avoid spreading bacteria and viruses.  Only take over-the-counter or prescription medicines for pain, discomfort, or fever as directed by your caregiver.  Ask your caregiver if you should continue all prescribed and over-the-counter medicines.  Keep all follow-up appointments with your caregiver. SEEK MEDICAL CARE IF:  You have abdominal pain and it increases or stays in one area (localizes).  You have a rash, stiff neck, or severe headache.  You are irritable, sleepy, or difficult to awaken.  You are weak, dizzy, or extremely thirsty. SEEK IMMEDIATE MEDICAL CARE IF:   You are unable to keep fluids down or you get worse despite treatment.  You have frequent episodes of vomiting or diarrhea.  You have blood or green matter (bile) in your vomit.  You have blood in your stool or your stool looks black and tarry.  You have not urinated in 6 to 8 hours, or you have only urinated a small amount of very dark urine.  You have a fever.  You faint. MAKE SURE YOU:   Understand these instructions.  Will watch your condition.  Will get help right away if you are not doing well or get worse. Document Released: 11/01/2005 Document Revised: 01/24/2012 Document Reviewed: 06/21/2011 ExitCare Patient Information 2015 [employer]. This information is not intended to replace advice given to you by your health care provider. Make sure you discuss any questions you have with your health care   provider.  

## 2014-06-21 ENCOUNTER — Telehealth: Payer: Self-pay | Admitting: *Deleted

## 2014-06-21 ENCOUNTER — Other Ambulatory Visit: Payer: Self-pay | Admitting: *Deleted

## 2014-06-21 ENCOUNTER — Ambulatory Visit
Admission: RE | Admit: 2014-06-21 | Discharge: 2014-06-21 | Disposition: A | Discharge status: Home / Self Care | Payer: BC Managed Care – PPO | Source: Ambulatory Visit | Attending: Radiation Oncology | Admitting: Radiation Oncology

## 2014-06-21 ENCOUNTER — Ambulatory Visit (HOSPITAL_BASED_OUTPATIENT_CLINIC_OR_DEPARTMENT_OTHER): Payer: BC Managed Care – PPO

## 2014-06-21 VITALS — BP 127/68 | HR 101 | Temp 98.6°F

## 2014-06-21 DIAGNOSIS — C099 Malignant neoplasm of tonsil, unspecified: Secondary | ICD-10-CM | POA: Diagnosis not present

## 2014-06-21 DIAGNOSIS — E860 Dehydration: Secondary | ICD-10-CM

## 2014-06-21 MED ORDER — HYDROMORPHONE HCL PF 4 MG/ML IJ SOLN
INTRAMUSCULAR | Status: AC
  Filled 2014-06-21: qty 1

## 2014-06-21 MED ORDER — SODIUM CHLORIDE 0.9 % IV SOLN
Freq: Once | INTRAVENOUS | Status: AC
  Administered 2014-06-21: 09:00:00 via INTRAVENOUS

## 2014-06-21 MED ORDER — HYDROMORPHONE HCL PF 2 MG/ML IJ SOLN
2.0000 mg | Freq: Every day | INTRAMUSCULAR | Status: DC | PRN
  Administered 2014-06-21: 2 mg via INTRAVENOUS
  Filled 2014-06-21: qty 1

## 2014-06-21 MED ORDER — HEPARIN SOD (PORK) LOCK FLUSH 100 UNIT/ML IV SOLN
500.0000 [IU] | Freq: Once | INTRAVENOUS | Status: AC | PRN
  Administered 2014-06-21: 500 [IU]
  Filled 2014-06-21: qty 5

## 2014-06-21 MED ORDER — SODIUM CHLORIDE 0.9 % IJ SOLN
10.0000 mL | INTRAMUSCULAR | Status: DC | PRN
  Administered 2014-06-21: 10 mL
  Filled 2014-06-21: qty 10

## 2014-06-21 NOTE — Telephone Encounter (Signed)
Per POF staff message scheduled appts. Advised scheduler 

## 2014-06-21 NOTE — Telephone Encounter (Signed)
Wife called to report pt has no IVFs scheduled for next week as ordered by Dr. Alvy Bimler.  IVFs added by scheduler and called wife w/ times. She verbalized understanding.

## 2014-06-21 NOTE — Patient Instructions (Signed)
Dehydration, Adult Dehydration is when you lose more fluids from the body than you take in. Vital organs like the kidneys, brain, and heart cannot function without a proper amount of fluids and salt. Any loss of fluids from the body can cause dehydration.  CAUSES   Vomiting.  Diarrhea.  Excessive sweating.  Excessive urine output.  Fever. SYMPTOMS  Mild dehydration  Thirst.  Dry lips.  Slightly dry mouth. Moderate dehydration  Very dry mouth.  Sunken eyes.  Skin does not bounce back quickly when lightly pinched and released.  Dark urine and decreased urine production.  Decreased tear production.  Headache. Severe dehydration  Very dry mouth.  Extreme thirst.  Rapid, weak pulse (more than 100 beats per minute at rest).  Cold hands and feet.  Not able to sweat in spite of heat and temperature.  Rapid breathing.  Blue lips.  Confusion and lethargy.  Difficulty being awakened.  Minimal urine production.  No tears. DIAGNOSIS  Your caregiver will diagnose dehydration based on your symptoms and your exam. Blood and urine tests will help confirm the diagnosis. The diagnostic evaluation should also identify the cause of dehydration. TREATMENT  Treatment of mild or moderate dehydration can often be done at home by increasing the amount of fluids that you drink. It is best to drink small amounts of fluid more often. Drinking too much at one time can make vomiting worse. Refer to the home care instructions below. Severe dehydration needs to be treated at the hospital where you will probably be given intravenous (IV) fluids that contain water and electrolytes. HOME CARE INSTRUCTIONS   Ask your caregiver about specific rehydration instructions.  Drink enough fluids to keep your urine clear or pale yellow.  Drink small amounts frequently if you have nausea and vomiting.  Eat as you normally do.  Avoid:  Foods or drinks high in sugar.  Carbonated  drinks.  Juice.  Extremely hot or cold fluids.  Drinks with caffeine.  Fatty, greasy foods.  Alcohol.  Tobacco.  Overeating.  Gelatin desserts.  Wash your hands well to avoid spreading bacteria and viruses.  Only take over-the-counter or prescription medicines for pain, discomfort, or fever as directed by your caregiver.  Ask your caregiver if you should continue all prescribed and over-the-counter medicines.  Keep all follow-up appointments with your caregiver. SEEK MEDICAL CARE IF:  You have abdominal pain and it increases or stays in one area (localizes).  You have a rash, stiff neck, or severe headache.  You are irritable, sleepy, or difficult to awaken.  You are weak, dizzy, or extremely thirsty. SEEK IMMEDIATE MEDICAL CARE IF:   You are unable to keep fluids down or you get worse despite treatment.  You have frequent episodes of vomiting or diarrhea.  You have blood or green matter (bile) in your vomit.  You have blood in your stool or your stool looks black and tarry.  You have not urinated in 6 to 8 hours, or you have only urinated a small amount of very dark urine.  You have a fever.  You faint. MAKE SURE YOU:   Understand these instructions.  Will watch your condition.  Will get help right away if you are not doing well or get worse. Document Released: 11/01/2005 Document Revised: 01/24/2012 Document Reviewed: 06/21/2011 ExitCare Patient Information 2015 [employer]. This information is not intended to replace advice given to you by your health care provider. Make sure you discuss any questions you have with your health care   provider.  

## 2014-06-22 NOTE — Progress Notes (Signed)
Followed up with patient during IVF administration.  He reported that his throat was "really sore" last HS and he continues to take Roxanol.  We discussed, again, action of Fentanyl and that he just applied his initial patch last evening.  He recognized that there has probably not been sufficient time to realize maximum benefit.  I encouraged him to revisit his pain control when he comes for IVF tomorrow.  Gayleen Orem, RN, BSN, [facility] Head & Neck Oncology Navigator [phone]

## 2014-06-22 NOTE — Progress Notes (Signed)
To provide support, encouragement and care continuity, met with patient and his wife during appt with Dr. Alvy Bimler.  Provided further explanation/support for plan for daily fluids for remainder of this and into next week.  Using Teach Back, provided further education on purpose/use of Fentanyl patch, continued use of Roxanol for breakthrough pain.  Continuing navigation as L2 patient (treatments established).  Gayleen Orem, RN, BSN, [facility] Head & Neck Oncology Navigator [phone]

## 2014-06-23 ENCOUNTER — Other Ambulatory Visit: Payer: Self-pay | Admitting: Hematology and Oncology

## 2014-06-23 DIAGNOSIS — C099 Malignant neoplasm of tonsil, unspecified: Secondary | ICD-10-CM

## 2014-06-24 ENCOUNTER — Ambulatory Visit
Admission: RE | Admit: 2014-06-24 | Discharge: 2014-06-24 | Disposition: A | Discharge status: Home / Self Care | Payer: BC Managed Care – PPO | Source: Ambulatory Visit | Attending: Radiation Oncology | Admitting: Radiation Oncology

## 2014-06-24 ENCOUNTER — Encounter: Payer: Self-pay | Admitting: Radiation Oncology

## 2014-06-24 ENCOUNTER — Telehealth: Payer: Self-pay | Admitting: Hematology and Oncology

## 2014-06-24 ENCOUNTER — Ambulatory Visit (HOSPITAL_BASED_OUTPATIENT_CLINIC_OR_DEPARTMENT_OTHER): Payer: BC Managed Care – PPO

## 2014-06-24 ENCOUNTER — Encounter: Payer: Self-pay | Admitting: *Deleted

## 2014-06-24 ENCOUNTER — Other Ambulatory Visit (HOSPITAL_BASED_OUTPATIENT_CLINIC_OR_DEPARTMENT_OTHER): Payer: BC Managed Care – PPO

## 2014-06-24 VITALS — BP 108/67 | HR 91 | Temp 98.4°F | Resp 20 | Wt 177.9 lb

## 2014-06-24 DIAGNOSIS — C099 Malignant neoplasm of tonsil, unspecified: Secondary | ICD-10-CM | POA: Diagnosis not present

## 2014-06-24 DIAGNOSIS — E860 Dehydration: Secondary | ICD-10-CM

## 2014-06-24 LAB — CBC WITH DIFFERENTIAL/PLATELET
BASO%: 0.2 % (ref 0.0–2.0)
Basophils Absolute: 0 10*3/uL (ref 0.0–0.1)
EOS ABS: 0 10*3/uL (ref 0.0–0.5)
EOS%: 0.6 % (ref 0.0–7.0)
HCT: 37 % — ABNORMAL LOW (ref 38.4–49.9)
HGB: 12.2 g/dL — ABNORMAL LOW (ref 13.0–17.1)
LYMPH#: 0.8 10*3/uL — AB (ref 0.9–3.3)
LYMPH%: 19.8 % (ref 14.0–49.0)
MCH: 30.4 pg (ref 27.2–33.4)
MCHC: 33.1 g/dL (ref 32.0–36.0)
MCV: 92.1 fL (ref 79.3–98.0)
MONO#: 0.7 10*3/uL (ref 0.1–0.9)
MONO%: 17.9 % — ABNORMAL HIGH (ref 0.0–14.0)
NEUT%: 61.5 % (ref 39.0–75.0)
NEUTROS ABS: 2.4 10*3/uL (ref 1.5–6.5)
Platelets: 153 10*3/uL (ref 140–400)
RBC: 4.02 10*6/uL — ABNORMAL LOW (ref 4.20–5.82)
RDW: 12.7 % (ref 11.0–14.6)
WBC: 3.9 10*3/uL — ABNORMAL LOW (ref 4.0–10.3)

## 2014-06-24 LAB — COMPREHENSIVE METABOLIC PANEL
ALBUMIN: 3.1 g/dL — AB (ref 3.5–5.2)
ALT: 1225 U/L — ABNORMAL HIGH (ref 0–53)
AST: 1022 U/L — AB (ref 0–37)
Alkaline Phosphatase: 71 U/L (ref 39–117)
BUN: 16 mg/dL (ref 6–23)
CALCIUM: 9.5 mg/dL (ref 8.4–10.5)
CHLORIDE: 94 meq/L — AB (ref 96–112)
CO2: 31 meq/L (ref 19–32)
Creatinine, Ser: 1.09 mg/dL (ref 0.50–1.35)
GLUCOSE: 139 mg/dL — AB (ref 70–99)
POTASSIUM: 4.5 meq/L (ref 3.5–5.3)
Sodium: 134 mEq/L — ABNORMAL LOW (ref 135–145)
Total Bilirubin: 1.7 mg/dL — ABNORMAL HIGH (ref 0.2–1.2)
Total Protein: 6.9 g/dL (ref 6.0–8.3)

## 2014-06-24 LAB — MAGNESIUM: MAGNESIUM: 2 mg/dL (ref 1.5–2.5)

## 2014-06-24 MED ORDER — SODIUM CHLORIDE 0.9 % IV SOLN
Freq: Once | INTRAVENOUS | Status: AC
  Administered 2014-06-24: 15:00:00 via INTRAVENOUS

## 2014-06-24 MED ORDER — HYDROMORPHONE HCL PF 4 MG/ML IJ SOLN
INTRAMUSCULAR | Status: AC
  Filled 2014-06-24: qty 1

## 2014-06-24 MED ORDER — HYDROMORPHONE HCL PF 1 MG/ML IJ SOLN
2.0000 mg | Freq: Every day | INTRAMUSCULAR | Status: DC | PRN
  Administered 2014-06-24: 2 mg via INTRAVENOUS
  Filled 2014-06-24: qty 2

## 2014-06-24 NOTE — Patient Instructions (Signed)
Dehydration, Adult Dehydration is when you lose more fluids from the body than you take in. Vital organs like the kidneys, brain, and heart cannot function without a proper amount of fluids and salt. Any loss of fluids from the body can cause dehydration.  CAUSES   Vomiting.  Diarrhea.  Excessive sweating.  Excessive urine output.  Fever. SYMPTOMS  Mild dehydration  Thirst.  Dry lips.  Slightly dry mouth. Moderate dehydration  Very dry mouth.  Sunken eyes.  Skin does not bounce back quickly when lightly pinched and released.  Dark urine and decreased urine production.  Decreased tear production.  Headache. Severe dehydration  Very dry mouth.  Extreme thirst.  Rapid, weak pulse (more than 100 beats per minute at rest).  Cold hands and feet.  Not able to sweat in spite of heat and temperature.  Rapid breathing.  Blue lips.  Confusion and lethargy.  Difficulty being awakened.  Minimal urine production.  No tears. DIAGNOSIS  Your caregiver will diagnose dehydration based on your symptoms and your exam. Blood and urine tests will help confirm the diagnosis. The diagnostic evaluation should also identify the cause of dehydration. TREATMENT  Treatment of mild or moderate dehydration can often be done at home by increasing the amount of fluids that you drink. It is best to drink small amounts of fluid more often. Drinking too much at one time can make vomiting worse. Refer to the home care instructions below. Severe dehydration needs to be treated at the hospital where you will probably be given intravenous (IV) fluids that contain water and electrolytes. HOME CARE INSTRUCTIONS   Ask your caregiver about specific rehydration instructions.  Drink enough fluids to keep your urine clear or pale yellow.  Drink small amounts frequently if you have nausea and vomiting.  Eat as you normally do.  Avoid:  Foods or drinks high in sugar.  Carbonated  drinks.  Juice.  Extremely hot or cold fluids.  Drinks with caffeine.  Fatty, greasy foods.  Alcohol.  Tobacco.  Overeating.  Gelatin desserts.  Wash your hands well to avoid spreading bacteria and viruses.  Only take over-the-counter or prescription medicines for pain, discomfort, or fever as directed by your caregiver.  Ask your caregiver if you should continue all prescribed and over-the-counter medicines.  Keep all follow-up appointments with your caregiver. SEEK MEDICAL CARE IF:  You have abdominal pain and it increases or stays in one area (localizes).  You have a rash, stiff neck, or severe headache.  You are irritable, sleepy, or difficult to awaken.  You are weak, dizzy, or extremely thirsty. SEEK IMMEDIATE MEDICAL CARE IF:   You are unable to keep fluids down or you get worse despite treatment.  You have frequent episodes of vomiting or diarrhea.  You have blood or green matter (bile) in your vomit.  You have blood in your stool or your stool looks black and tarry.  You have not urinated in 6 to 8 hours, or you have only urinated a small amount of very dark urine.  You have a fever.  You faint. MAKE SURE YOU:   Understand these instructions.  Will watch your condition.  Will get help right away if you are not doing well or get worse. Document Released: 11/01/2005 Document Revised: 01/24/2012 Document Reviewed: 06/21/2011 ExitCare Patient Information 2015 [employer]. This information is not intended to replace advice given to you by your health care provider. Make sure you discuss any questions you have with your health care   provider.  

## 2014-06-24 NOTE — Progress Notes (Signed)
   Weekly Management Note:  Outpatient Current Dose:  54 Gy  Projected Dose: 70 Gy    ICD-9-CM  1. Tonsil cancer 146.0    Narrative:  The patient presents for routine under treatment assessment.  CBCT/MVCT images/Port film x-rays were reviewed.  The chart was checked. He has dry desquamation, peeling, erythema on neck, using biafine cream tid, no pain at present, taking Morphine daily, and has fentanyl patch 25 mcg on skin  Drinks 5/6 shakes carnation instant breakfast and 3 cans osmolite via peg tube with flushing before and after with water.    Gained 1 lb since last week; to get IVF's today  Physical Findings:  weight is 177 lb 14.4 oz (80.695 kg). His oral temperature is 98.4 F (36.9 C). His blood pressure is 108/67 and his pulse is 91. His respiration is 20 and oxygen saturation is 99%.  NAD, confluent mucositis in oropharynx. Dry desquamation over neck. Erythema of neck skin. No thrush  CBC    Component Value Date/Time   WBC 3.9* 06/24/2014 1252   WBC 8.7 05/11/2014 0518   RBC 4.02* 06/24/2014 1252   RBC 4.11* 05/11/2014 0518   HGB 12.2* 06/24/2014 1252   HGB 12.7* 05/11/2014 0518   HCT 37.0* 06/24/2014 1252   HCT 37.3* 05/11/2014 0518   PLT 153 06/24/2014 1252   PLT 180 05/11/2014 0518   MCV 92.1 06/24/2014 1252   MCV 90.8 05/11/2014 0518   MCH 30.4 06/24/2014 1252   MCH 30.9 05/11/2014 0518   MCHC 33.1 06/24/2014 1252   MCHC 34.0 05/11/2014 0518   RDW 12.7 06/24/2014 1252   RDW 12.7 05/11/2014 0518   LYMPHSABS 0.8* 06/24/2014 1252   MONOABS 0.7 06/24/2014 1252   EOSABS 0.0 06/24/2014 1252   BASOSABS 0.0 06/24/2014 1252     CMP     Component Value Date/Time   NA 134* 06/24/2014 1253   NA 139 06/10/2014 1300   K 4.5 06/24/2014 1253   K 4.6 06/10/2014 1300   CL 94* 06/24/2014 1253   CO2 31 06/24/2014 1253   CO2 31* 06/10/2014 1300   GLUCOSE 139* 06/24/2014 1253   GLUCOSE 89 06/10/2014 1300   BUN 16 06/24/2014 1253   BUN 14.4 06/10/2014 1300   CREATININE 1.09 06/24/2014 1253   CREATININE 1.0 06/10/2014 1300   CALCIUM 9.5 06/24/2014 1253   CALCIUM 10.0 06/10/2014 1300   PROT 6.9 06/24/2014 1253   PROT 7.6 06/10/2014 1300   ALBUMIN 3.1* 06/24/2014 1253   ALBUMIN 3.4* 06/10/2014 1300   AST 1022* 06/24/2014 1253   AST 37* 06/10/2014 1300   ALT 1225* 06/24/2014 1253   ALT 84* 06/10/2014 1300   ALKPHOS 71 06/24/2014 1253   ALKPHOS 56 06/10/2014 1300   BILITOT 1.7* 06/24/2014 1253   BILITOT 0.47 06/10/2014 1300   GFRNONAA >90 05/11/2014 0518   GFRAA >90 05/11/2014 0518     Impression:  The patient is tolerating radiotherapy.   Plan:  Continue radiotherapy as planned. IVFs in med/onc today -----------------------------------  Eppie Gibson, MD

## 2014-06-24 NOTE — Telephone Encounter (Signed)
I reviewed his liver function test result with the patient. He has grossly elevated liver enzymes are due to reaction from flare of hepatitis C. I recommend close observation and supportive care.

## 2014-06-24 NOTE — Progress Notes (Addendum)
To provide support, encouragement and care continuity, met with patient and his wife during weekly UT with Dr. Isidore Moos.  Discussed: 1)  Pain mgt - he reports that Fentanyl is helping with pain control, he's taking Roxanol ca. 3-4 times daily for breakthrough pain. 2)  Neck care - currently washing neck by placing warm wet washcloth over neck during bath, BID application of Biafine. 3)  Discontinuation of PEG - when he demonstrates weight maintenance by oral intake over a couple of weeks. 4)  Discontinuation of PAC - typically s/p post-tmt PET and results indicating no further need for chemo. Continuing navigation as L2 patient (treatments established).  Gayleen Orem, RN, BSN, [facility] Head & Neck Oncology Navigator [phone]

## 2014-06-25 ENCOUNTER — Other Ambulatory Visit: Payer: Self-pay | Admitting: Hematology and Oncology

## 2014-06-25 ENCOUNTER — Emergency Department (HOSPITAL_COMMUNITY)
Admission: EM | Admit: 2014-06-25 | Discharge: 2014-06-25 | Disposition: A | Discharge status: Home / Self Care | Payer: BC Managed Care – PPO | Attending: Emergency Medicine | Admitting: Emergency Medicine

## 2014-06-25 ENCOUNTER — Encounter (HOSPITAL_COMMUNITY): Payer: Self-pay | Admitting: Emergency Medicine

## 2014-06-25 ENCOUNTER — Telehealth: Payer: Self-pay | Admitting: *Deleted

## 2014-06-25 ENCOUNTER — Encounter: Payer: Self-pay | Admitting: *Deleted

## 2014-06-25 ENCOUNTER — Ambulatory Visit
Admission: RE | Admit: 2014-06-25 | Discharge: 2014-06-25 | Disposition: A | Discharge status: Home / Self Care | Payer: BC Managed Care – PPO | Source: Ambulatory Visit | Attending: Radiation Oncology | Admitting: Radiation Oncology

## 2014-06-25 ENCOUNTER — Telehealth: Payer: Self-pay | Admitting: Hematology and Oncology

## 2014-06-25 ENCOUNTER — Ambulatory Visit (HOSPITAL_BASED_OUTPATIENT_CLINIC_OR_DEPARTMENT_OTHER): Payer: BC Managed Care – PPO | Admitting: Hematology and Oncology

## 2014-06-25 ENCOUNTER — Encounter: Payer: Self-pay | Admitting: Hematology and Oncology

## 2014-06-25 ENCOUNTER — Ambulatory Visit (HOSPITAL_BASED_OUTPATIENT_CLINIC_OR_DEPARTMENT_OTHER): Payer: BC Managed Care – PPO

## 2014-06-25 ENCOUNTER — Emergency Department (HOSPITAL_COMMUNITY): Payer: BC Managed Care – PPO

## 2014-06-25 VITALS — BP 133/80 | HR 76 | Temp 98.2°F | Resp 18

## 2014-06-25 VITALS — BP 117/66 | HR 101 | Temp 98.9°F | Resp 18 | Ht 68.0 in | Wt 180.4 lb

## 2014-06-25 DIAGNOSIS — R7401 Elevation of levels of liver transaminase levels: Secondary | ICD-10-CM

## 2014-06-25 DIAGNOSIS — D72819 Decreased white blood cell count, unspecified: Secondary | ICD-10-CM

## 2014-06-25 DIAGNOSIS — Z872 Personal history of diseases of the skin and subcutaneous tissue: Secondary | ICD-10-CM | POA: Diagnosis not present

## 2014-06-25 DIAGNOSIS — K5900 Constipation, unspecified: Secondary | ICD-10-CM | POA: Diagnosis not present

## 2014-06-25 DIAGNOSIS — R000 Tachycardia, unspecified: Secondary | ICD-10-CM | POA: Insufficient documentation

## 2014-06-25 DIAGNOSIS — E860 Dehydration: Secondary | ICD-10-CM

## 2014-06-25 DIAGNOSIS — Z79899 Other long term (current) drug therapy: Secondary | ICD-10-CM | POA: Diagnosis not present

## 2014-06-25 DIAGNOSIS — Z87891 Personal history of nicotine dependence: Secondary | ICD-10-CM | POA: Diagnosis not present

## 2014-06-25 DIAGNOSIS — C099 Malignant neoplasm of tonsil, unspecified: Secondary | ICD-10-CM | POA: Diagnosis not present

## 2014-06-25 DIAGNOSIS — T451X5A Adverse effect of antineoplastic and immunosuppressive drugs, initial encounter: Secondary | ICD-10-CM

## 2014-06-25 DIAGNOSIS — B370 Candidal stomatitis: Secondary | ICD-10-CM | POA: Diagnosis not present

## 2014-06-25 DIAGNOSIS — R070 Pain in throat: Secondary | ICD-10-CM

## 2014-06-25 DIAGNOSIS — R740 Nonspecific elevation of levels of transaminase and lactic acid dehydrogenase [LDH]: Secondary | ICD-10-CM

## 2014-06-25 DIAGNOSIS — Z8669 Personal history of other diseases of the nervous system and sense organs: Secondary | ICD-10-CM | POA: Diagnosis not present

## 2014-06-25 DIAGNOSIS — Z931 Gastrostomy status: Secondary | ICD-10-CM

## 2014-06-25 DIAGNOSIS — R509 Fever, unspecified: Secondary | ICD-10-CM | POA: Diagnosis present

## 2014-06-25 DIAGNOSIS — D701 Agranulocytosis secondary to cancer chemotherapy: Secondary | ICD-10-CM | POA: Insufficient documentation

## 2014-06-25 DIAGNOSIS — R7402 Elevation of levels of lactic acid dehydrogenase (LDH): Secondary | ICD-10-CM

## 2014-06-25 DIAGNOSIS — R1110 Vomiting, unspecified: Secondary | ICD-10-CM | POA: Insufficient documentation

## 2014-06-25 DIAGNOSIS — D630 Anemia in neoplastic disease: Secondary | ICD-10-CM

## 2014-06-25 DIAGNOSIS — R21 Rash and other nonspecific skin eruption: Secondary | ICD-10-CM

## 2014-06-25 DIAGNOSIS — K1231 Oral mucositis (ulcerative) due to antineoplastic therapy: Secondary | ICD-10-CM

## 2014-06-25 LAB — URINALYSIS, ROUTINE W REFLEX MICROSCOPIC
Bilirubin Urine: NEGATIVE
Glucose, UA: NEGATIVE mg/dL
Hgb urine dipstick: NEGATIVE
Ketones, ur: NEGATIVE mg/dL
Leukocytes, UA: NEGATIVE
Nitrite: NEGATIVE
Protein, ur: NEGATIVE mg/dL
Specific Gravity, Urine: 1.017 (ref 1.005–1.030)
Urobilinogen, UA: 1 mg/dL (ref 0.0–1.0)
pH: 6.5 (ref 5.0–8.0)

## 2014-06-25 LAB — COMPREHENSIVE METABOLIC PANEL
ALBUMIN: 2.9 g/dL — AB (ref 3.5–5.2)
ALK PHOS: 62 U/L (ref 39–117)
ALT: 1023 U/L — AB (ref 0–53)
AST: 739 U/L — ABNORMAL HIGH (ref 0–37)
Anion gap: 10 (ref 5–15)
BUN: 13 mg/dL (ref 6–23)
CO2: 26 mEq/L (ref 19–32)
Calcium: 8.6 mg/dL (ref 8.4–10.5)
Chloride: 93 mEq/L — ABNORMAL LOW (ref 96–112)
Creatinine, Ser: 0.95 mg/dL (ref 0.50–1.35)
GFR calc Af Amer: >90 mL/min (ref >90)
GFR calc non Af Amer: >90 mL/min (ref >90)
GLUCOSE: 128 mg/dL — AB (ref 70–99)
POTASSIUM: 4.4 meq/L (ref 3.7–5.3)
SODIUM: 129 meq/L — AB (ref 137–147)
TOTAL PROTEIN: 6.4 g/dL (ref 6.0–8.3)
Total Bilirubin: 1.3 mg/dL — ABNORMAL HIGH (ref 0.3–1.2)

## 2014-06-25 LAB — CBC WITH DIFFERENTIAL/PLATELET
BASOS PCT: 0 % (ref 0–1)
Basophils Absolute: 0 10*3/uL (ref 0.0–0.1)
Eosinophils Absolute: 0 10*3/uL (ref 0.0–0.7)
Eosinophils Relative: 1 % (ref 0–5)
HCT: 31.2 % — ABNORMAL LOW (ref 39.0–52.0)
Hemoglobin: 11.1 g/dL — ABNORMAL LOW (ref 13.0–17.0)
LYMPHS ABS: 1.3 10*3/uL (ref 0.7–4.0)
Lymphocytes Relative: 35 % (ref 12–46)
MCH: 31.2 pg (ref 26.0–34.0)
MCHC: 35.6 g/dL (ref 30.0–36.0)
MCV: 87.6 fL (ref 78.0–100.0)
Monocytes Absolute: 0.7 10*3/uL (ref 0.1–1.0)
Monocytes Relative: 20 % — ABNORMAL HIGH (ref 3–12)
NEUTROS ABS: 1.7 10*3/uL (ref 1.7–7.7)
NEUTROS PCT: 44 % (ref 43–77)
PLATELETS: 157 10*3/uL (ref 150–400)
RBC: 3.56 MIL/uL — AB (ref 4.22–5.81)
RDW: 12.7 % (ref 11.5–15.5)
WBC: 3.8 10*3/uL — ABNORMAL LOW (ref 4.0–10.5)

## 2014-06-25 LAB — I-STAT CG4 LACTIC ACID, ED: Lactic Acid, Venous: 0.91 mmol/L (ref 0.5–2.2)

## 2014-06-25 MED ORDER — HEPARIN SOD (PORK) LOCK FLUSH 100 UNIT/ML IV SOLN
500.0000 [IU] | Freq: Once | INTRAVENOUS | Status: AC
  Administered 2014-06-25: 500 [IU]
  Filled 2014-06-25: qty 5

## 2014-06-25 MED ORDER — DEXTROSE 5 % IV SOLN
1.0000 g | Freq: Once | INTRAVENOUS | Status: AC
  Administered 2014-06-25: 1 g via INTRAVENOUS
  Filled 2014-06-25: qty 10

## 2014-06-25 MED ORDER — SODIUM CHLORIDE 0.9 % IJ SOLN
INTRAMUSCULAR | Status: AC
  Administered 2014-06-25: 01:00:00
  Filled 2014-06-25: qty 10

## 2014-06-25 MED ORDER — ACETAMINOPHEN 325 MG PO TABS
650.0000 mg | ORAL_TABLET | Freq: Four times a day (QID) | ORAL | Status: DC | PRN
  Administered 2014-06-25: 650 mg via ORAL
  Filled 2014-06-25 (×2): qty 2

## 2014-06-25 MED ORDER — HEPARIN SOD (PORK) LOCK FLUSH 100 UNIT/ML IV SOLN
500.0000 [IU] | Freq: Once | INTRAVENOUS | Status: AC | PRN
  Administered 2014-06-25: 500 [IU]
  Filled 2014-06-25: qty 5

## 2014-06-25 MED ORDER — SODIUM CHLORIDE 0.9 % IV SOLN
Freq: Once | INTRAVENOUS | Status: AC
  Administered 2014-06-25: 15:00:00 via INTRAVENOUS

## 2014-06-25 MED ORDER — SODIUM CHLORIDE 0.9 % IV BOLUS (SEPSIS)
1000.0000 mL | Freq: Once | INTRAVENOUS | Status: AC
  Administered 2014-06-25: 1000 mL via INTRAVENOUS

## 2014-06-25 MED ORDER — SODIUM CHLORIDE 0.9 % IJ SOLN
10.0000 mL | INTRAMUSCULAR | Status: DC | PRN
  Administered 2014-06-25: 10 mL
  Filled 2014-06-25: qty 10

## 2014-06-25 NOTE — ED Notes (Signed)
MD Knapp at bedside 

## 2014-06-25 NOTE — Progress Notes (Signed)
To provide support, encouragement and care continuity, particularly in context of patient's visit to [facility] ER last HS, met with patient and his wife during est pt appt with Dr. Gorsuch.  He reported that he measured a temp of 101.5 before going to bed, was advised by on-call doc to go to ER; fever resolved during the visit, he returned home.    Rick Diehl, RN, BSN, [facility] Head & Neck Oncology Navigator [facility] [phone]   

## 2014-06-25 NOTE — Assessment & Plan Note (Signed)
This is due to radiation induced skin injury. I recommended topical emollient cream

## 2014-06-25 NOTE — Assessment & Plan Note (Signed)
The patient will unlikely receive his third dose of cisplatin. It depends on how much his blood work recovers. At most, he only get 50% with his last dose. I will reschedule his chemotherapy to next week. In the meantime we'll continue aggressive supportive care.

## 2014-06-25 NOTE — Assessment & Plan Note (Signed)
This is getting better with the addition of fentanyl patch.. He will continue to use liquid morphine sulfate as needed for breakthrough pain.

## 2014-06-25 NOTE — ED Provider Notes (Signed)
Pt undergoing chemotherapy.  Presents with fever this evening.  No other symptoms.   On exam, appears well.   LFTs are elevated this evening.  Somewhat improved from his most recent labs. Cultures sent off. Discussed with oncology.  Will give dose of rocephin.  Follow up as outpatient.  Medical screening examination/treatment/procedure(s) were performed by non-physician practitioner and as supervising physician I was immediately available for consultation/collaboration.   Dorie Rank, MD 06/25/14 [phone]

## 2014-06-25 NOTE — Assessment & Plan Note (Signed)
His feeding tube looks okay with no signs of infection.

## 2014-06-25 NOTE — ED Notes (Signed)
Pt aware of the need for a urine sample. Urinal at bedside. 

## 2014-06-25 NOTE — Telephone Encounter (Signed)
Pt confirmed labs/ov per 08/11 POF, gave pt AVS...KJ °

## 2014-06-25 NOTE — Assessment & Plan Note (Signed)
He is mildly hypotensive and I am concerned about dehydration. The patient is very motivated to increase oral hydration. I told him to substitute water with Gatorade and nutritional supplements. In addition, I will continue IV fluids daily to prevent risk of kidney injury.

## 2014-06-25 NOTE — ED Notes (Signed)
Pt being treated with radiation and chemo for neck and head cancer, last treatment yesterday, tonight before going to bed his temp was elevated and the on call Dr told him to come in for evaluation

## 2014-06-25 NOTE — Assessment & Plan Note (Signed)
This is likely related to a flare of hepatitis C. Continue to monitor closely.

## 2014-06-25 NOTE — ED Notes (Signed)
Pt transported to XRAY °

## 2014-06-25 NOTE — ED Notes (Signed)
Gail NP at bedside 

## 2014-06-25 NOTE — Patient Instructions (Signed)
Dehydration, Adult Dehydration is when you lose more fluids from the body than you take in. Vital organs like the kidneys, brain, and heart cannot function without a proper amount of fluids and salt. Any loss of fluids from the body can cause dehydration.  CAUSES   Vomiting.  Diarrhea.  Excessive sweating.  Excessive urine output.  Fever. SYMPTOMS  Mild dehydration  Thirst.  Dry lips.  Slightly dry mouth. Moderate dehydration  Very dry mouth.  Sunken eyes.  Skin does not bounce back quickly when lightly pinched and released.  Dark urine and decreased urine production.  Decreased tear production.  Headache. Severe dehydration  Very dry mouth.  Extreme thirst.  Rapid, weak pulse (more than 100 beats per minute at rest).  Cold hands and feet.  Not able to sweat in spite of heat and temperature.  Rapid breathing.  Blue lips.  Confusion and lethargy.  Difficulty being awakened.  Minimal urine production.  No tears. DIAGNOSIS  Your caregiver will diagnose dehydration based on your symptoms and your exam. Blood and urine tests will help confirm the diagnosis. The diagnostic evaluation should also identify the cause of dehydration. TREATMENT  Treatment of mild or moderate dehydration can often be done at home by increasing the amount of fluids that you drink. It is best to drink small amounts of fluid more often. Drinking too much at one time can make vomiting worse. Refer to the home care instructions below. Severe dehydration needs to be treated at the hospital where you will probably be given intravenous (IV) fluids that contain water and electrolytes. HOME CARE INSTRUCTIONS   Ask your caregiver about specific rehydration instructions.  Drink enough fluids to keep your urine clear or pale yellow.  Drink small amounts frequently if you have nausea and vomiting.  Eat as you normally do.  Avoid:  Foods or drinks high in sugar.  Carbonated  drinks.  Juice.  Extremely hot or cold fluids.  Drinks with caffeine.  Fatty, greasy foods.  Alcohol.  Tobacco.  Overeating.  Gelatin desserts.  Wash your hands well to avoid spreading bacteria and viruses.  Only take over-the-counter or prescription medicines for pain, discomfort, or fever as directed by your caregiver.  Ask your caregiver if you should continue all prescribed and over-the-counter medicines.  Keep all follow-up appointments with your caregiver. SEEK MEDICAL CARE IF:  You have abdominal pain and it increases or stays in one area (localizes).  You have a rash, stiff neck, or severe headache.  You are irritable, sleepy, or difficult to awaken.  You are weak, dizzy, or extremely thirsty. SEEK IMMEDIATE MEDICAL CARE IF:   You are unable to keep fluids down or you get worse despite treatment.  You have frequent episodes of vomiting or diarrhea.  You have blood or green matter (bile) in your vomit.  You have blood in your stool or your stool looks black and tarry.  You have not urinated in 6 to 8 hours, or you have only urinated a small amount of very dark urine.  You have a fever.  You faint. MAKE SURE YOU:   Understand these instructions.  Will watch your condition.  Will get help right away if you are not doing well or get worse. Document Released: 11/01/2005 Document Revised: 01/24/2012 Document Reviewed: 06/21/2011 ExitCare Patient Information 2015 [employer]. This information is not intended to replace advice given to you by your health care provider. Make sure you discuss any questions you have with your health care   provider.  
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
## 2014-06-25 NOTE — Assessment & Plan Note (Signed)
I recommend conservative management. I will increase his pain medications as noted above.

## 2014-06-25 NOTE — Assessment & Plan Note (Signed)
This is likely due to recent treatment. The patient denies recent history of bleeding such as epistaxis, hematuria or hematochezia. He is asymptomatic from the anemia. I will observe for now.    

## 2014-06-25 NOTE — Telephone Encounter (Signed)
Per POF staff message scheduled appts. Advised scheduler 

## 2014-06-25 NOTE — Discharge Instructions (Signed)
Fever, Adult A fever is a temperature of 100.4 F (38 C) or above.  HOME CARE  Take fever medicine as told by your doctor. Do not  take aspirin for fever if you are younger than 56 years of age.  If you are given antibiotic medicine, take it as told. Finish the medicine even if you start to feel better.  Rest.  Drink enough fluids to keep your pee (urine) clear or pale yellow. Do not drink alcohol.  Take a bath or shower with room temperature water. Do not use ice water or alcohol sponge baths.  Wear lightweight, loose clothes. GET HELP RIGHT AWAY IF:   You are short of breath or have trouble breathing.  You are very weak.  You are dizzy or you pass out (faint).  You are very thirsty or are making little or no urine.  You have new pain.  You throw up (vomit) or have watery poop (diarrhea).  You keep throwing up or having watery poop for more than 1 to 2 days.  You have a stiff neck or light bothers your eyes.  You have a skin rash.  You have a fever or problems (symptoms) that last for more than 2 to 3 days.  You have a fever and your problems quickly get worse.  You keep throwing up the fluids you drink.  You do not feel better after 3 days.  You have new problems. MAKE SURE YOU:   Understand these instructions.  Will watch your condition.  Will get help right away if you are not doing well or get worse. Document Released: 08/10/2008 Document Revised: 01/24/2012 Document Reviewed: 09/02/2011 [facility] Patient Information 2015 [place], [place]. This information is not intended to replace advice given to you by your health care provider. Make sure you discuss any questions you have with your health care provider. As discussed your case was discussed with the oncall oncologist who recommended a dose of antibiotic in the ED and that you followup in the office today

## 2014-06-25 NOTE — Progress Notes (Signed)
[facility] OFFICE PROGRESS NOTE  Patient Care Team: Gavin Pound, MD as PCP - General (Family Medicine) Brooks Sailors, RN as Oncology Nurse Navigator (Oncology)  SUMMARY OF ONCOLOGIC HISTORY: Oncology History   Tonsil cancer, Right, HPV positive,    Primary site: Pharynx - Oropharynx   Staging method: AJCC 7th Edition   Clinical: Stage IVA (T1, N2, M0) signed by Heath Lark, MD on 04/17/2014  1:04 PM   Summary: Stage IVA (T1, N2, M0)       Tonsil cancer   03/20/2014 Imaging Ct scan of neck showed several complex solid and cystic lesions within the right neck and abnormalities in the pharynx   03/25/2014 Procedure 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 Multiple hypermetabolic right level 2 lymph nodes compatible with metastatic adenopathy   05/10/2014 Surgery The patient has placement of port and feeding tube.   05/15/2014 -  Chemotherapy He received high dose cisplatin every 3 weeks   05/15/2014 -  Radiation Therapy He received radiation treatment   06/05/2014 Adverse Reaction Cycle 2 of chemotherapy is delayed due to neutropenia.    INTERVAL HISTORY: Please see below for problem oriented charting. He returns today to see me prior to cycle 3 of treatment. He was in the emergency department last night due to fever. It has since resolved. He felt dehydrated. His pain appeared are better controlled with fentanyl patch. He denies nausea or vomiting.  REVIEW OF SYSTEMS:   Constitutional: Denies fevers, chills or abnormal weight loss Eyes: Denies blurriness of vision Respiratory: Denies cough, dyspnea or wheezes Cardiovascular: Denies palpitation, chest discomfort or lower extremity swelling Gastrointestinal:  Denies nausea, heartburn or change in bowel habits Lymphatics:  Denies new lymphadenopathy or easy bruising Neurological:Denies numbness, tingling or new weaknesses Behavioral/Psych: Mood is stable, no new changes  All other systems were reviewed with the patient and are negative.  I have reviewed the past medical history, past surgical history, social history and family history with the patient and they are unchanged from previous note.  ALLERGIES:  is allergic to sulfa antibiotics.  MEDICATIONS:  Current Outpatient Prescriptions  Medication Sig Dispense Refill  . Alum & Mag Hydroxide-Simeth (MAGIC MOUTHWASH W/LIDOCAINE) SOLN Take 10 mLs by mouth 4 (four) times daily as needed for mouth pain.      Marland Kitchen emollient (BIAFINE) cream Apply 1 application topically 3 (three) times daily.      . fentaNYL (DURAGESIC - DOSED MCG/HR) 25 MCG/HR patch Place 1 patch (25 mcg total) onto the skin every 3 (three) days.  5 patch  0  . lidocaine-prilocaine (EMLA) cream Apply 1 application topically as needed. Apply to [facility] a Cath site one hour prior to needle stick.  30 g  3  . morphine (ROXANOL) 20 MG/ML concentrated solution Take 0.5 mLs (10 mg total) by mouth every 2 (two) hours as needed for severe pain.  120 mL  0  . ondansetron (ZOFRAN) 8 MG tablet Take 1 tablet (8 mg total) by mouth every 8 (eight) hours as needed for nausea or vomiting. Start on the third day after chemotherapy.  30 tablet  1  . polyethylene glycol (MIRALAX / GLYCOLAX) packet Take 17 g by mouth daily as needed (for constipation).      . prochlorperazine (COMPAZINE) 10 MG tablet Take 1 tablet (10 mg total) by  mouth every 6 (six) hours as needed (Nausea or vomiting).  30 tablet  1  . sodium fluoride (FLUORISHIELD) 1.1 % GEL dental gel Instill one drop of fluoride per tooth space of fluoride tray. Place over teeth for 5 minutes. Remove. Spit out excess. Repeat nightly.  120 mL  prn   No current facility-administered medications for this visit.   Facility-Administered Medications Ordered in Other Visits   Medication Dose Route Frequency Provider Last Rate Last Dose  . heparin lock flush 100 unit/mL  500 Units Intracatheter Once PRN Heath Lark, MD      . sodium chloride 0.9 % injection 10 mL  10 mL Intracatheter PRN Heath Lark, MD        PHYSICAL EXAMINATION: ECOG PERFORMANCE STATUS: 1 - Symptomatic but completely ambulatory  Filed Vitals:   06/25/14 1353  BP: 117/66  Pulse: 101  Temp: 98.9 F (37.2 C)  Resp: 18   Filed Weights   06/25/14 1353  Weight: 180 lb 6.4 oz (81.829 kg)    GENERAL:alert, no distress and comfortable SKIN: Noted radiation-induced skin injury. Mild ulceration is noted. EYES: His conjunctiva appear jaundiced. OROPHARYNX: Ulceration is noted in the oropharynx. No thrush. NECK: supple, thyroid normal size, non-tender, without nodularity LYMPH:  Persistent palpable lymphadenopathy in the neck.  LUNGS: clear to auscultation and percussion with normal breathing effort HEART: regular rate & rhythm and no murmurs and no lower extremity edema ABDOMEN:abdomen soft, non-tender and normal bowel sounds. Feeding tube site looks okay Musculoskeletal:no cyanosis of digits and no clubbing  NEURO: alert & oriented x 3 with fluent speech, no focal motor/sensory deficits  LABORATORY DATA:  I have reviewed the data as listed    Component Value Date/Time   NA 129* 06/25/2014 0125   NA 139 06/10/2014 1300   K 4.4 06/25/2014 0125   K 4.6 06/10/2014 1300   CL 93* 06/25/2014 0125   CO2 26 06/25/2014 0125   CO2 31* 06/10/2014 1300   GLUCOSE 128* 06/25/2014 0125   GLUCOSE 89 06/10/2014 1300   BUN 13 06/25/2014 0125   BUN 14.4 06/10/2014 1300   CREATININE 0.95 06/25/2014 0125   CREATININE 1.0 06/10/2014 1300   CALCIUM 8.6 06/25/2014 0125   CALCIUM 10.0 06/10/2014 1300   PROT 6.4 06/25/2014 0125   PROT 7.6 06/10/2014 1300   ALBUMIN 2.9* 06/25/2014 0125   ALBUMIN 3.4* 06/10/2014 1300   AST 739* 06/25/2014 0125   AST 37* 06/10/2014 1300   ALT 1023* 06/25/2014 0125   ALT 84* 06/10/2014 1300    ALKPHOS 62 06/25/2014 0125   ALKPHOS 56 06/10/2014 1300   BILITOT 1.3* 06/25/2014 0125   BILITOT 0.47 06/10/2014 1300   GFRNONAA >90 06/25/2014 0125   GFRAA >90 06/25/2014 0125    No results found for this basename: SPEP, UPEP,  kappa and lambda light chains    Lab Results  Component Value Date   WBC 3.8* 06/25/2014   NEUTROABS 1.7 06/25/2014   HGB 11.1* 06/25/2014   HCT 31.2* 06/25/2014   MCV 87.6 06/25/2014   PLT 157 06/25/2014      Chemistry      Component Value Date/Time   NA 129* 06/25/2014 0125   NA 139 06/10/2014 1300   K 4.4 06/25/2014 0125   K 4.6 06/10/2014 1300   CL 93* 06/25/2014 0125   CO2 26 06/25/2014 0125   CO2 31* 06/10/2014 1300   BUN 13 06/25/2014 0125   BUN 14.4 06/10/2014 1300   CREATININE 0.95 06/25/2014 0125  CREATININE 1.0 06/10/2014 1300      Component Value Date/Time   CALCIUM 8.6 06/25/2014 0125   CALCIUM 10.0 06/10/2014 1300   ALKPHOS 62 06/25/2014 0125   ALKPHOS 56 06/10/2014 1300   AST 739* 06/25/2014 0125   AST 37* 06/10/2014 1300   ALT 1023* 06/25/2014 0125   ALT 84* 06/10/2014 1300   BILITOT 1.3* 06/25/2014 0125   BILITOT 0.47 06/10/2014 1300       RADIOGRAPHIC STUDIES: I have personally reviewed the radiological images as listed and agreed with the findings in the report. Dg Chest 2 View  06/25/2014   CLINICAL DATA:  Fever. On chemotherapy and radiation therapy for tonsillar cancer.  EXAM: CHEST  2 VIEW  COMPARISON:  Chest radiograph performed 05/10/2014  FINDINGS: The lungs are well-aerated and clear. There is no evidence of focal opacification, pleural effusion or pneumothorax.  The heart is normal in size; the mediastinal contour is within normal limits. A left-sided chest port is noted ending about the distal SVC. No acute osseous abnormalities are seen.  IMPRESSION: No acute cardiopulmonary process seen.   Electronically Signed   By: Garald Balding M.D.   On: 06/25/2014 01:34     ASSESSMENT & PLAN:  Tonsil cancer The patient will unlikely receive  his third dose of cisplatin. It depends on how much his blood work recovers. At most, he only get 50% with his last dose. I will reschedule his chemotherapy to next week. In the meantime we'll continue aggressive supportive care.  Throat pain This is getting better with the addition of fentanyl patch.. He will continue to use liquid morphine sulfate as needed for breakthrough pain.    S/P percutaneous endoscopic gastrostomy (PEG) tube placement His feeding tube looks okay with no signs of infection.    Rash This is due to radiation induced skin injury. I recommended topical emollient cream      Mucositis (ulcerative) due to antineoplastic therapy I recommend conservative management. I will increase his pain medications as noted above.    Elevated transaminase level This is likely related to a flare of hepatitis C. Continue to monitor closely.    Dehydration He is mildly hypotensive and I am concerned about dehydration. The patient is very motivated to increase oral hydration. I told him to substitute water with Gatorade and nutritional supplements. In addition, I will continue IV fluids daily to prevent risk of kidney injury.    Leukopenia due to antineoplastic chemotherapy This is likely due to recent treatment. The patient denies recent history of fevers, cough, chills, diarrhea or dysuria. He is asymptomatic from the leukopenia. I will observe for now.    Anemia in neoplastic disease This is likely due to recent treatment. The patient denies recent history of bleeding such as epistaxis, hematuria or hematochezia. He is asymptomatic from the anemia. I will observe for now.    No orders of the defined types were placed in this encounter.   All questions were answered. The patient knows to call the clinic with any problems, questions or concerns. No barriers to learning was detected. I spent 40 minutes counseling the patient face to face. The total time spent in  the appointment was 55 minutes and more than 50% was on counseling and review of test results     [facility], [place], MD 06/25/2014 3:47 PM

## 2014-06-25 NOTE — Assessment & Plan Note (Signed)
This is likely due to recent treatment. The patient denies recent history of fevers, cough, chills, diarrhea or dysuria. He is asymptomatic from the leukopenia. I will observe for now.  

## 2014-06-25 NOTE — ED Notes (Signed)
Pt ambulated to the lobby. Steady gait noted.

## 2014-06-26 ENCOUNTER — Telehealth: Payer: Self-pay | Admitting: *Deleted

## 2014-06-26 ENCOUNTER — Ambulatory Visit
Admission: RE | Admit: 2014-06-26 | Discharge: 2014-06-26 | Disposition: A | Discharge status: Home / Self Care | Payer: BC Managed Care – PPO | Source: Ambulatory Visit | Attending: Radiation Oncology | Admitting: Radiation Oncology

## 2014-06-26 ENCOUNTER — Ambulatory Visit (HOSPITAL_BASED_OUTPATIENT_CLINIC_OR_DEPARTMENT_OTHER): Payer: BC Managed Care – PPO

## 2014-06-26 ENCOUNTER — Ambulatory Visit: Payer: Self-pay

## 2014-06-26 ENCOUNTER — Ambulatory Visit: Payer: BC Managed Care – PPO | Admitting: Nutrition

## 2014-06-26 VITALS — BP 117/70 | HR 82 | Temp 98.4°F | Resp 18

## 2014-06-26 DIAGNOSIS — C099 Malignant neoplasm of tonsil, unspecified: Secondary | ICD-10-CM | POA: Diagnosis not present

## 2014-06-26 DIAGNOSIS — E860 Dehydration: Secondary | ICD-10-CM

## 2014-06-26 LAB — URINE CULTURE
CULTURE: NO GROWTH
Colony Count: NO GROWTH

## 2014-06-26 MED ORDER — SODIUM CHLORIDE 0.9 % IV SOLN
Freq: Once | INTRAVENOUS | Status: AC
  Administered 2014-06-26: 14:00:00 via INTRAVENOUS

## 2014-06-26 MED ORDER — HEPARIN SOD (PORK) LOCK FLUSH 100 UNIT/ML IV SOLN
500.0000 [IU] | Freq: Once | INTRAVENOUS | Status: AC | PRN
  Administered 2014-06-26: 500 [IU]
  Filled 2014-06-26: qty 5

## 2014-06-26 MED ORDER — SODIUM CHLORIDE 0.9 % IJ SOLN
10.0000 mL | INTRAMUSCULAR | Status: DC | PRN
  Administered 2014-06-26: 10 mL
  Filled 2014-06-26: qty 10

## 2014-06-26 NOTE — Progress Notes (Signed)
Patient reports he is doing well.  Weight documented as 180 pounds.  Patient is tolerating some nutrition by mouth and some nutrition via tube.  He has no questions.  Nutrition diagnosis: Food and nutrition related knowledge deficit improved.  Intervention: Patient to continue oral intake by mouth and via tube using Osmolite 1.5 to promote weight stabilization.  Teach back method used.  Monitoring, evaluation, goals: Patient is tolerating oral intake and tube feedings with weight stabilization.  Next visit: Patient to contact me with questions or concerns.  He has my contact information.   **Disclaimer: This note was dictated with voice recognition software. Similar sounding words can inadvertently be transcribed and this note may contain transcription errors which may not have been corrected upon publication of note.**

## 2014-06-26 NOTE — Telephone Encounter (Signed)
Per patient family and POF I have scheduled appts

## 2014-06-26 NOTE — Patient Instructions (Signed)
Dehydration, Adult Dehydration is when you lose more fluids from the body than you take in. Vital organs like the kidneys, brain, and heart cannot function without a proper amount of fluids and salt. Any loss of fluids from the body can cause dehydration.  CAUSES   Vomiting.  Diarrhea.  Excessive sweating.  Excessive urine output.  Fever. SYMPTOMS  Mild dehydration  Thirst.  Dry lips.  Slightly dry mouth. Moderate dehydration  Very dry mouth.  Sunken eyes.  Skin does not bounce back quickly when lightly pinched and released.  Dark urine and decreased urine production.  Decreased tear production.  Headache. Severe dehydration  Very dry mouth.  Extreme thirst.  Rapid, weak pulse (more than 100 beats per minute at rest).  Cold hands and feet.  Not able to sweat in spite of heat and temperature.  Rapid breathing.  Blue lips.  Confusion and lethargy.  Difficulty being awakened.  Minimal urine production.  No tears. DIAGNOSIS  Your caregiver will diagnose dehydration based on your symptoms and your exam. Blood and urine tests will help confirm the diagnosis. The diagnostic evaluation should also identify the cause of dehydration. TREATMENT  Treatment of mild or moderate dehydration can often be done at home by increasing the amount of fluids that you drink. It is best to drink small amounts of fluid more often. Drinking too much at one time can make vomiting worse. Refer to the home care instructions below. Severe dehydration needs to be treated at the hospital where you will probably be given intravenous (IV) fluids that contain water and electrolytes. HOME CARE INSTRUCTIONS   Ask your caregiver about specific rehydration instructions.  Drink enough fluids to keep your urine clear or pale yellow.  Drink small amounts frequently if you have nausea and vomiting.  Eat as you normally do.  Avoid:  Foods or drinks high in sugar.  Carbonated  drinks.  Juice.  Extremely hot or cold fluids.  Drinks with caffeine.  Fatty, greasy foods.  Alcohol.  Tobacco.  Overeating.  Gelatin desserts.  Wash your hands well to avoid spreading bacteria and viruses.  Only take over-the-counter or prescription medicines for pain, discomfort, or fever as directed by your caregiver.  Ask your caregiver if you should continue all prescribed and over-the-counter medicines.  Keep all follow-up appointments with your caregiver. SEEK MEDICAL CARE IF:  You have abdominal pain and it increases or stays in one area (localizes).  You have a rash, stiff neck, or severe headache.  You are irritable, sleepy, or difficult to awaken.  You are weak, dizzy, or extremely thirsty. SEEK IMMEDIATE MEDICAL CARE IF:   You are unable to keep fluids down or you get worse despite treatment.  You have frequent episodes of vomiting or diarrhea.  You have blood or green matter (bile) in your vomit.  You have blood in your stool or your stool looks black and tarry.  You have not urinated in 6 to 8 hours, or you have only urinated a small amount of very dark urine.  You have a fever.  You faint. MAKE SURE YOU:   Understand these instructions.  Will watch your condition.  Will get help right away if you are not doing well or get worse. Document Released: 11/01/2005 Document Revised: 01/24/2012 Document Reviewed: 06/21/2011 ExitCare Patient Information 2015 [employer]. This information is not intended to replace advice given to you by your health care provider. Make sure you discuss any questions you have with your health care   provider.  

## 2014-06-27 ENCOUNTER — Encounter: Payer: Self-pay | Admitting: *Deleted

## 2014-06-27 ENCOUNTER — Ambulatory Visit (HOSPITAL_BASED_OUTPATIENT_CLINIC_OR_DEPARTMENT_OTHER): Payer: BC Managed Care – PPO

## 2014-06-27 ENCOUNTER — Ambulatory Visit
Admission: RE | Admit: 2014-06-27 | Discharge: 2014-06-27 | Disposition: A | Discharge status: Home / Self Care | Payer: BC Managed Care – PPO | Source: Ambulatory Visit | Attending: Radiation Oncology | Admitting: Radiation Oncology

## 2014-06-27 VITALS — BP 116/73 | HR 79 | Temp 98.5°F | Resp 18

## 2014-06-27 DIAGNOSIS — C099 Malignant neoplasm of tonsil, unspecified: Secondary | ICD-10-CM | POA: Diagnosis not present

## 2014-06-27 DIAGNOSIS — E860 Dehydration: Secondary | ICD-10-CM

## 2014-06-27 MED ORDER — HEPARIN SOD (PORK) LOCK FLUSH 100 UNIT/ML IV SOLN
500.0000 [IU] | Freq: Once | INTRAVENOUS | Status: AC | PRN
  Administered 2014-06-27: 500 [IU]
  Filled 2014-06-27: qty 5

## 2014-06-27 MED ORDER — SODIUM CHLORIDE 0.9 % IV SOLN
Freq: Once | INTRAVENOUS | Status: AC
  Administered 2014-06-27: 15:00:00 via INTRAVENOUS

## 2014-06-27 MED ORDER — HYDROMORPHONE HCL PF 4 MG/ML IJ SOLN
INTRAMUSCULAR | Status: AC
  Filled 2014-06-27: qty 1

## 2014-06-27 MED ORDER — SODIUM CHLORIDE 0.9 % IJ SOLN
10.0000 mL | INTRAMUSCULAR | Status: DC | PRN
  Administered 2014-06-27: 10 mL
  Filled 2014-06-27: qty 10

## 2014-06-27 MED ORDER — HYDROMORPHONE HCL PF 1 MG/ML IJ SOLN
2.0000 mg | Freq: Every day | INTRAMUSCULAR | Status: DC | PRN
  Administered 2014-06-27: 2 mg via INTRAVENOUS
  Filled 2014-06-27: qty 2

## 2014-06-27 NOTE — Progress Notes (Signed)
Met with patient in lobby prior to his scheduled IVF.  I relayed to him that in the context of his elevated liver function, Dr. Alvy Bimler requests that she be consulted before he takes any new OTC medications or supplements.  He verbalized understanding.  He stated that he didn't take an anti-emetic last HS b/c of his concern. I encouraged him to address SEs with current PRN medications and emphasized importance of maintaining their control.  I reiterated that it is new medications that are to be scrutinized by Dr. Alvy Bimler.  He stated understanding.  Gayleen Orem, RN, BSN, [facility] [phone]

## 2014-06-27 NOTE — Progress Notes (Signed)
Pt here for IVF.  Stated has ongoing throat discomfort from radiation.  Noted small white areas towards back of mouth.  Pt requested pain meds for the throat discomfort.  No other complaints.  Denied nausea/vomiting.

## 2014-06-27 NOTE — Patient Instructions (Signed)
Dehydration, Adult Dehydration is when you lose more fluids from the body than you take in. Vital organs like the kidneys, brain, and heart cannot function without a proper amount of fluids and salt. Any loss of fluids from the body can cause dehydration.  CAUSES   Vomiting.  Diarrhea.  Excessive sweating.  Excessive urine output.  Fever. SYMPTOMS  Mild dehydration  Thirst.  Dry lips.  Slightly dry mouth. Moderate dehydration  Very dry mouth.  Sunken eyes.  Skin does not bounce back quickly when lightly pinched and released.  Dark urine and decreased urine production.  Decreased tear production.  Headache. Severe dehydration  Very dry mouth.  Extreme thirst.  Rapid, weak pulse (more than 100 beats per minute at rest).  Cold hands and feet.  Not able to sweat in spite of heat and temperature.  Rapid breathing.  Blue lips.  Confusion and lethargy.  Difficulty being awakened.  Minimal urine production.  No tears. DIAGNOSIS  Your caregiver will diagnose dehydration based on your symptoms and your exam. Blood and urine tests will help confirm the diagnosis. The diagnostic evaluation should also identify the cause of dehydration. TREATMENT  Treatment of mild or moderate dehydration can often be done at home by increasing the amount of fluids that you drink. It is best to drink small amounts of fluid more often. Drinking too much at one time can make vomiting worse. Refer to the home care instructions below. Severe dehydration needs to be treated at the hospital where you will probably be given intravenous (IV) fluids that contain water and electrolytes. HOME CARE INSTRUCTIONS   Ask your caregiver about specific rehydration instructions.  Drink enough fluids to keep your urine clear or pale yellow.  Drink small amounts frequently if you have nausea and vomiting.  Eat as you normally do.  Avoid:  Foods or drinks high in sugar.  Carbonated  drinks.  Juice.  Extremely hot or cold fluids.  Drinks with caffeine.  Fatty, greasy foods.  Alcohol.  Tobacco.  Overeating.  Gelatin desserts.  Wash your hands well to avoid spreading bacteria and viruses.  Only take over-the-counter or prescription medicines for pain, discomfort, or fever as directed by your caregiver.  Ask your caregiver if you should continue all prescribed and over-the-counter medicines.  Keep all follow-up appointments with your caregiver. SEEK MEDICAL CARE IF:  You have abdominal pain and it increases or stays in one area (localizes).  You have a rash, stiff neck, or severe headache.  You are irritable, sleepy, or difficult to awaken.  You are weak, dizzy, or extremely thirsty. SEEK IMMEDIATE MEDICAL CARE IF:   You are unable to keep fluids down or you get worse despite treatment.  You have frequent episodes of vomiting or diarrhea.  You have blood or green matter (bile) in your vomit.  You have blood in your stool or your stool looks black and tarry.  You have not urinated in 6 to 8 hours, or you have only urinated a small amount of very dark urine.  You have a fever.  You faint. MAKE SURE YOU:   Understand these instructions.  Will watch your condition.  Will get help right away if you are not doing well or get worse. Document Released: 11/01/2005 Document Revised: 01/24/2012 Document Reviewed: 06/21/2011 ExitCare Patient Information 2015 [employer]. This information is not intended to replace advice given to you by your health care provider. Make sure you discuss any questions you have with your health care   provider.  

## 2014-06-28 ENCOUNTER — Ambulatory Visit (HOSPITAL_BASED_OUTPATIENT_CLINIC_OR_DEPARTMENT_OTHER): Payer: BC Managed Care – PPO

## 2014-06-28 ENCOUNTER — Ambulatory Visit
Admission: RE | Admit: 2014-06-28 | Discharge: 2014-06-28 | Disposition: A | Discharge status: Home / Self Care | Payer: BC Managed Care – PPO | Source: Ambulatory Visit | Attending: Radiation Oncology | Admitting: Radiation Oncology

## 2014-06-28 VITALS — BP 146/70 | HR 88 | Temp 99.4°F | Resp 18

## 2014-06-28 DIAGNOSIS — E860 Dehydration: Secondary | ICD-10-CM

## 2014-06-28 DIAGNOSIS — C099 Malignant neoplasm of tonsil, unspecified: Secondary | ICD-10-CM

## 2014-06-28 MED ORDER — SODIUM CHLORIDE 0.9 % IJ SOLN
10.0000 mL | INTRAMUSCULAR | Status: DC | PRN
  Administered 2014-06-28 (×2): 10 mL
  Filled 2014-06-28: qty 10

## 2014-06-28 MED ORDER — HYDROMORPHONE HCL PF 1 MG/ML IJ SOLN
2.0000 mg | Freq: Every day | INTRAMUSCULAR | Status: DC | PRN
  Administered 2014-06-28: 2 mg via INTRAVENOUS
  Filled 2014-06-28: qty 2

## 2014-06-28 MED ORDER — HEPARIN SOD (PORK) LOCK FLUSH 100 UNIT/ML IV SOLN
500.0000 [IU] | Freq: Once | INTRAVENOUS | Status: AC | PRN
  Administered 2014-06-28: 500 [IU]
  Filled 2014-06-28: qty 5

## 2014-06-28 MED ORDER — SODIUM CHLORIDE 0.9 % IV SOLN
Freq: Once | INTRAVENOUS | Status: AC
  Administered 2014-06-28: 14:00:00 via INTRAVENOUS

## 2014-06-28 MED ORDER — HYDROMORPHONE HCL PF 4 MG/ML IJ SOLN
INTRAMUSCULAR | Status: AC
  Filled 2014-06-28: qty 1

## 2014-06-28 NOTE — Patient Instructions (Signed)
Dehydration, Adult Dehydration is when you lose more fluids from the body than you take in. Vital organs like the kidneys, brain, and heart cannot function without a proper amount of fluids and salt. Any loss of fluids from the body can cause dehydration.  CAUSES   Vomiting.  Diarrhea.  Excessive sweating.  Excessive urine output.  Fever. SYMPTOMS  Mild dehydration  Thirst.  Dry lips.  Slightly dry mouth. Moderate dehydration  Very dry mouth.  Sunken eyes.  Skin does not bounce back quickly when lightly pinched and released.  Dark urine and decreased urine production.  Decreased tear production.  Headache. Severe dehydration  Very dry mouth.  Extreme thirst.  Rapid, weak pulse (more than 100 beats per minute at rest).  Cold hands and feet.  Not able to sweat in spite of heat and temperature.  Rapid breathing.  Blue lips.  Confusion and lethargy.  Difficulty being awakened.  Minimal urine production.  No tears. DIAGNOSIS  Your caregiver will diagnose dehydration based on your symptoms and your exam. Blood and urine tests will help confirm the diagnosis. The diagnostic evaluation should also identify the cause of dehydration. TREATMENT  Treatment of mild or moderate dehydration can often be done at home by increasing the amount of fluids that you drink. It is best to drink small amounts of fluid more often. Drinking too much at one time can make vomiting worse. Refer to the home care instructions below. Severe dehydration needs to be treated at the hospital where you will probably be given intravenous (IV) fluids that contain water and electrolytes. HOME CARE INSTRUCTIONS   Ask your caregiver about specific rehydration instructions.  Drink enough fluids to keep your urine clear or pale yellow.  Drink small amounts frequently if you have nausea and vomiting.  Eat as you normally do.  Avoid:  Foods or drinks high in sugar.  Carbonated  drinks.  Juice.  Extremely hot or cold fluids.  Drinks with caffeine.  Fatty, greasy foods.  Alcohol.  Tobacco.  Overeating.  Gelatin desserts.  Wash your hands well to avoid spreading bacteria and viruses.  Only take over-the-counter or prescription medicines for pain, discomfort, or fever as directed by your caregiver.  Ask your caregiver if you should continue all prescribed and over-the-counter medicines.  Keep all follow-up appointments with your caregiver. SEEK MEDICAL CARE IF:  You have abdominal pain and it increases or stays in one area (localizes).  You have a rash, stiff neck, or severe headache.  You are irritable, sleepy, or difficult to awaken.  You are weak, dizzy, or extremely thirsty. SEEK IMMEDIATE MEDICAL CARE IF:   You are unable to keep fluids down or you get worse despite treatment.  You have frequent episodes of vomiting or diarrhea.  You have blood or green matter (bile) in your vomit.  You have blood in your stool or your stool looks black and tarry.  You have not urinated in 6 to 8 hours, or you have only urinated a small amount of very dark urine.  You have a fever.  You faint. MAKE SURE YOU:   Understand these instructions.  Will watch your condition.  Will get help right away if you are not doing well or get worse. Document Released: 11/01/2005 Document Revised: 01/24/2012 Document Reviewed: 06/21/2011 ExitCare Patient Information 2015 [employer]. This information is not intended to replace advice given to you by your health care provider. Make sure you discuss any questions you have with your health care   provider.  

## 2014-06-29 ENCOUNTER — Ambulatory Visit (HOSPITAL_BASED_OUTPATIENT_CLINIC_OR_DEPARTMENT_OTHER): Payer: BC Managed Care – PPO

## 2014-06-29 VITALS — BP 120/68 | HR 90 | Temp 98.4°F | Resp 20

## 2014-06-29 DIAGNOSIS — E860 Dehydration: Secondary | ICD-10-CM

## 2014-06-29 DIAGNOSIS — C099 Malignant neoplasm of tonsil, unspecified: Secondary | ICD-10-CM

## 2014-06-29 DIAGNOSIS — R070 Pain in throat: Secondary | ICD-10-CM

## 2014-06-29 MED ORDER — SODIUM CHLORIDE 0.9 % IV SOLN
Freq: Once | INTRAVENOUS | Status: AC
  Administered 2014-06-29: 09:00:00 via INTRAVENOUS

## 2014-06-29 MED ORDER — HEPARIN SOD (PORK) LOCK FLUSH 100 UNIT/ML IV SOLN
500.0000 [IU] | Freq: Once | INTRAVENOUS | Status: AC | PRN
  Administered 2014-06-29: 500 [IU]
  Filled 2014-06-29: qty 5

## 2014-06-29 MED ORDER — HEPARIN SOD (PORK) LOCK FLUSH 100 UNIT/ML IV SOLN
250.0000 [IU] | Freq: Once | INTRAVENOUS | Status: DC | PRN
  Filled 2014-06-29: qty 5

## 2014-06-29 MED ORDER — SODIUM CHLORIDE 0.9 % IJ SOLN
10.0000 mL | INTRAMUSCULAR | Status: DC | PRN
  Administered 2014-06-29: 10 mL
  Filled 2014-06-29: qty 10

## 2014-06-29 MED ORDER — ALTEPLASE 2 MG IJ SOLR
2.0000 mg | Freq: Once | INTRAMUSCULAR | Status: DC | PRN
  Filled 2014-06-29: qty 2

## 2014-06-29 MED ORDER — ONDANSETRON 8 MG/50ML IVPB (CHCC)
8.0000 mg | Freq: Every day | INTRAVENOUS | Status: DC | PRN
  Administered 2014-06-29: 8 mg via INTRAVENOUS

## 2014-06-29 MED ORDER — PROMETHAZINE HCL 25 MG/ML IJ SOLN: 25.0000 mg | Freq: Every day | INTRAMUSCULAR | Status: DC | PRN

## 2014-06-29 MED ORDER — HYDROMORPHONE HCL PF 4 MG/ML IJ SOLN
2.0000 mg | Freq: Once | INTRAMUSCULAR | Status: AC
  Administered 2014-06-29: 2 mg via INTRAVENOUS

## 2014-06-29 MED ORDER — HYDROMORPHONE HCL PF 1 MG/ML IJ SOLN: 2.0000 mg | Freq: Every day | INTRAMUSCULAR | Status: DC | PRN

## 2014-06-29 MED ORDER — HYDROMORPHONE HCL PF 4 MG/ML IJ SOLN
INTRAMUSCULAR | Status: AC
  Filled 2014-06-29: qty 1

## 2014-06-29 NOTE — Patient Instructions (Signed)
Dehydration, Adult Dehydration is when you lose more fluids from the body than you take in. Vital organs like the kidneys, brain, and heart cannot function without a proper amount of fluids and salt. Any loss of fluids from the body can cause dehydration.  CAUSES   Vomiting.  Diarrhea.  Excessive sweating.  Excessive urine output.  Fever. SYMPTOMS  Mild dehydration  Thirst.  Dry lips.  Slightly dry mouth. Moderate dehydration  Very dry mouth.  Sunken eyes.  Skin does not bounce back quickly when lightly pinched and released.  Dark urine and decreased urine production.  Decreased tear production.  Headache. Severe dehydration  Very dry mouth.  Extreme thirst.  Rapid, weak pulse (more than 100 beats per minute at rest).  Cold hands and feet.  Not able to sweat in spite of heat and temperature.  Rapid breathing.  Blue lips.  Confusion and lethargy.  Difficulty being awakened.  Minimal urine production.  No tears. DIAGNOSIS  Your caregiver will diagnose dehydration based on your symptoms and your exam. Blood and urine tests will help confirm the diagnosis. The diagnostic evaluation should also identify the cause of dehydration. TREATMENT  Treatment of mild or moderate dehydration can often be done at home by increasing the amount of fluids that you drink. It is best to drink small amounts of fluid more often. Drinking too much at one time can make vomiting worse. Refer to the home care instructions below. Severe dehydration needs to be treated at the hospital where you will probably be given intravenous (IV) fluids that contain water and electrolytes. HOME CARE INSTRUCTIONS   Ask your caregiver about specific rehydration instructions.  Drink enough fluids to keep your urine clear or pale yellow.  Drink small amounts frequently if you have nausea and vomiting.  Eat as you normally do.  Avoid:  Foods or drinks high in sugar.  Carbonated  drinks.  Juice.  Extremely hot or cold fluids.  Drinks with caffeine.  Fatty, greasy foods.  Alcohol.  Tobacco.  Overeating.  Gelatin desserts.  Wash your hands well to avoid spreading bacteria and viruses.  Only take over-the-counter or prescription medicines for pain, discomfort, or fever as directed by your caregiver.  Ask your caregiver if you should continue all prescribed and over-the-counter medicines.  Keep all follow-up appointments with your caregiver. SEEK MEDICAL CARE IF:  You have abdominal pain and it increases or stays in one area (localizes).  You have a rash, stiff neck, or severe headache.  You are irritable, sleepy, or difficult to awaken.  You are weak, dizzy, or extremely thirsty. SEEK IMMEDIATE MEDICAL CARE IF:   You are unable to keep fluids down or you get worse despite treatment.  You have frequent episodes of vomiting or diarrhea.  You have blood or green matter (bile) in your vomit.  You have blood in your stool or your stool looks black and tarry.  You have not urinated in 6 to 8 hours, or you have only urinated a small amount of very dark urine.  You have a fever.  You faint. MAKE SURE YOU:   Understand these instructions.  Will watch your condition.  Will get help right away if you are not doing well or get worse. Document Released: 11/01/2005 Document Revised: 01/24/2012 Document Reviewed: 06/21/2011 ExitCare Patient Information 2015 [employer]. This information is not intended to replace advice given to you by your health care provider. Make sure you discuss any questions you have with your health care   provider.  

## 2014-07-01 ENCOUNTER — Other Ambulatory Visit (HOSPITAL_BASED_OUTPATIENT_CLINIC_OR_DEPARTMENT_OTHER): Payer: BC Managed Care – PPO

## 2014-07-01 ENCOUNTER — Ambulatory Visit
Admission: RE | Admit: 2014-07-01 | Discharge: 2014-07-01 | Disposition: A | Discharge status: Home / Self Care | Payer: BC Managed Care – PPO | Source: Ambulatory Visit | Attending: Radiation Oncology | Admitting: Radiation Oncology

## 2014-07-01 ENCOUNTER — Ambulatory Visit (HOSPITAL_BASED_OUTPATIENT_CLINIC_OR_DEPARTMENT_OTHER): Payer: BC Managed Care – PPO

## 2014-07-01 VITALS — Ht 68.0 in | Wt 180.9 lb

## 2014-07-01 VITALS — BP 138/69 | HR 84 | Temp 99.0°F | Resp 18

## 2014-07-01 DIAGNOSIS — R070 Pain in throat: Secondary | ICD-10-CM

## 2014-07-01 DIAGNOSIS — C099 Malignant neoplasm of tonsil, unspecified: Secondary | ICD-10-CM

## 2014-07-01 LAB — COMPREHENSIVE METABOLIC PANEL (CC13)
ALT: 419 U/L (ref 0–55)
AST: 301 U/L (ref 5–34)
Albumin: 2.6 g/dL — ABNORMAL LOW (ref 3.5–5.0)
Alkaline Phosphatase: 64 U/L (ref 40–150)
Anion Gap: 4 mEq/L (ref 3–11)
BUN: 12 mg/dL (ref 7.0–26.0)
CALCIUM: 8.6 mg/dL (ref 8.4–10.4)
CHLORIDE: 97 meq/L — AB (ref 98–109)
CO2: 31 mEq/L — ABNORMAL HIGH (ref 22–29)
Creatinine: 0.8 mg/dL (ref 0.7–1.3)
GLUCOSE: 132 mg/dL (ref 70–140)
Potassium: 4.5 mEq/L (ref 3.5–5.1)
Sodium: 133 mEq/L — ABNORMAL LOW (ref 136–145)
Total Bilirubin: 2.21 mg/dL — ABNORMAL HIGH (ref 0.20–1.20)
Total Protein: 6 g/dL — ABNORMAL LOW (ref 6.4–8.3)

## 2014-07-01 LAB — CBC WITH DIFFERENTIAL/PLATELET
BASO%: 0.2 % (ref 0.0–2.0)
BASOS ABS: 0 10*3/uL (ref 0.0–0.1)
EOS ABS: 0 10*3/uL (ref 0.0–0.5)
EOS%: 0.5 % (ref 0.0–7.0)
HCT: 34.3 % — ABNORMAL LOW (ref 38.4–49.9)
HGB: 11.4 g/dL — ABNORMAL LOW (ref 13.0–17.1)
LYMPH#: 1.7 10*3/uL (ref 0.9–3.3)
LYMPH%: 29.4 % (ref 14.0–49.0)
MCH: 31.2 pg (ref 27.2–33.4)
MCHC: 33.1 g/dL (ref 32.0–36.0)
MCV: 94.3 fL (ref 79.3–98.0)
MONO#: 1.8 10*3/uL — ABNORMAL HIGH (ref 0.1–0.9)
MONO%: 31.4 % — AB (ref 0.0–14.0)
NEUT#: 2.3 10*3/uL (ref 1.5–6.5)
NEUT%: 38.5 % — AB (ref 39.0–75.0)
PLATELETS: 364 10*3/uL (ref 140–400)
RBC: 3.64 10*6/uL — ABNORMAL LOW (ref 4.20–5.82)
RDW: 14.2 % (ref 11.0–14.6)
WBC: 5.9 10*3/uL (ref 4.0–10.3)

## 2014-07-01 LAB — CULTURE, BLOOD (ROUTINE X 2)
Culture: NO GROWTH
Culture: NO GROWTH

## 2014-07-01 LAB — MAGNESIUM (CC13): MAGNESIUM: 1.7 mg/dL (ref 1.5–2.5)

## 2014-07-01 LAB — TECHNOLOGIST REVIEW

## 2014-07-01 MED ORDER — SODIUM CHLORIDE 0.9 % IJ SOLN
10.0000 mL | INTRAMUSCULAR | Status: DC | PRN
  Administered 2014-07-01: 10 mL
  Filled 2014-07-01: qty 10

## 2014-07-01 MED ORDER — HEPARIN SOD (PORK) LOCK FLUSH 100 UNIT/ML IV SOLN
500.0000 [IU] | Freq: Once | INTRAVENOUS | Status: AC | PRN
  Administered 2014-07-01: 500 [IU]
  Filled 2014-07-01: qty 5

## 2014-07-01 MED ORDER — HYDROMORPHONE HCL PF 1 MG/ML IJ SOLN
2.0000 mg | Freq: Every day | INTRAMUSCULAR | Status: DC | PRN
  Administered 2014-07-01: 2 mg via INTRAVENOUS
  Filled 2014-07-01: qty 2

## 2014-07-01 MED ORDER — SODIUM CHLORIDE 0.9 % IV SOLN
1000.0000 mL | Freq: Once | INTRAVENOUS | Status: AC
  Administered 2014-07-01: 15:00:00 via INTRAVENOUS

## 2014-07-01 MED ORDER — HYDROMORPHONE HCL PF 4 MG/ML IJ SOLN
INTRAMUSCULAR | Status: AC
  Filled 2014-07-01: qty 1

## 2014-07-01 NOTE — Patient Instructions (Signed)
Throat Cancer Throat cancer (oropharyngeal cancer) is an abnormal growth that often develops on the mucosal (inner) surfaces of the larynx, pharynx, or mouth. The oropharynx is the middle part of the pharynx (throat) behind the mouth. It includes the back one-third of the tongue, the soft palate, the side and back walls of the throat, and the tonsils. The pharynx starts behind the nose and ends at the top of the trachea (windpipe) and esophagus (the tube that goes from the throat to the stomach). Food passes through the pharynx on the way to the esophagus. Air passes through on the way to the trachea. Cancer of the throat is cancer of the vocal cords, voice box (larynx), or other areas of the throat. Most throat cancers are the squamous cell type. Squamous cells are the thin, flat cells that line the inside of the oropharynx. Smoking cigarettes and abusing alcohol can increase a person's risk for throat cancer. Throat cancers usually develop around age 26, and men are 10 times more likely to develop them than women. There are many new surgical techniques to remove the cancer while keeping the function and cosmetic appearance unharmed as much as is possible. Advances in radiation therapy and other oncology services continue to improve treatment. RISK FACTORS Anything that increases your risk of getting a disease is called a risk factor. Risk factors include the following:  Smoking, drinking alcohol, and chewing tobacco all increase the risk.  Becoming infected with human papilloma virus (HPV).  Eating a diet low in fruits and vegetables.  Drinking Marlon Pel, an herbal stimulant used to make a drink similar to tea. This drink is common in [place].  Chewing betel quid, a stimulant (areca nut) which is chewed. The habit of chewing betel quid is widespread in [place], in the [place], and in people of [place] origin elsewhere in the world.  Any of the above habits which may have  contributed to your problem should be stopped. Your caregiver can offer you help to stop various harmful habits. SYMPTOMS  Possible signs of throat cancer include:  A sore throat, neck pain or lumps in the neck that do not get better within a couple weeks.  A sore throat that does not go away.  Hoarseness that does not get better in a couple weeks.  Pain behind the breastbone.  Cough and may include coughing up blood and abnormal (high-pitched) breathing sounds.  Trouble swallowing (dysphagia).  Weight loss for no known reason.  Fatigue and generalized tiredness.  Ear pain.  A growth in the back of the mouth, throat, or neck. DIAGNOSIS   Your caregiver will examine your throat using a mirror or fiberoptic instrument called a laryngoscope. This examination (laryngoscopy) uses a tube with a small, lighted camera. It allows your caregiver to look into the mouth and down the throat to look for a tumor.  To properly diagnose and stage throat cancer, your caregivers may take biopsies (tissue samples) from the growth. Specialized studies such as X-rays, ultrasound images, magnetic resonance imaging (MRI) scans, PET scans and computed tomography (CT) scans may be done. Your caregiver will discuss these with you.  Your treatment will ultimately depend on the stage (how far it has spread) and the grade (this is how fast the cancer is growing). It also depends on the location of the cancer and your health. All of these factors also affect the prognosis (what can be expected to happen). TREATMENT  Your caregivers will consider the type  and stage of your throat cancer, general health, and special considerations before helping you choose a treatment plan. Specialized surgery, radiation, chemotherapy and rehabilitative services are available for treatment options. Treatment is aimed at getting rid of the cancer and preventing spread to other parts of the body.   Small tumors can be treated with  either surgery (removed through an endoscope [tube] passed through the mouth to the cancer) or radiation therapy alone to get rid of the tumor. Larger tumors or those which have spread to lymph nodes in the neck may need a combination of radiation and chemotherapy. This may be used to preserve the voice box and is usually successful.  Removing the tumor with surgery (laryngectomy), including all or part of the vocal cords, is necessary in some cases. When laryngectomy is required, a surgical prosthesis (artificial vocal cords) may be implanted, voice aids may be used, or speech therapy may be recommended to teach alternative methods of speaking. Also when the entire voice box must be removed (total laryngectomy), your surgeon may connect the windpipe to the front of the neck. This allows you to breathe. This hole through which you breathe is called a tracheostomy.  Chemotherapy may be used when tumors have spread too far for surgery or radiation therapy to be successful. Chemotherapy is also used to enhance the effectiveness of radiation therapy. It may improve survival and help preserve the larynx.  Many patients also need swallowing therapy after treatment to help them adjust to the changes in the structure of the throat.  Joining a support group may be helpful. This group would include people who share common experiences and problems. Your caregivers can direct you in the correct direction to join one of these. RISKS AND COMPLICATIONS The following are problems which can happen following surgery and radiation.   Disfigurement of the neck or face.  Hardening and discoloration of the skin overlying the radiation site.  Skin may be red, sensitive, or easily irritated for months following radiation treatment. When radiation is used prior to chemotherapy, the treated skin may turn red, blister, and peel after chemo is given. Loose, soft clothes may help this. Lotions or similar products should not be  used unless directed by your caregiver.  Difficulty swallowing.  Cancer spread.  Airway obstruction.  Loss of voice. SEEK IMMEDIATE MEDICAL CARE IF: Following treatment, seek immediate medical care if you develop:  New onset of hoarseness.  New onset of coughing up blood.  Breaking open of a suture line.  A foul smell coming from the wound or dressing.  You develop a temperature over 101F (38.3C).  Shortness of breath.  Loss of voice.  Bleeding coming from the wound.  Increasing pain or swelling coming from the wound or surrounding area. Document Released: 10/14/2005 Document Revised: 03/18/2014 Document Reviewed: 11/03/2008 [facility] Patient Information 2015 [place], [place]. This information is not intended to replace advice given to you by your health care provider. Make sure you discuss any questions you have with your health care provider.

## 2014-07-01 NOTE — Progress Notes (Signed)
Weekly Management Note:  Site: Right tonsil/neck Current Dose:  6400  cGy Projected Dose: 7000  cGy  Narrative: The patient is seen today for routine under treatment assessment. CBCT/MVCT images/port films were reviewed. The chart was reviewed.   He continues to do remarkably well. His throat pain is 5/10 and he uses a fentanyl patch, by mouth morphine and Magic mouthwash. He does swallow a milkshake once a day. He is taking in 4 cans of Osmolite through his PEG tube. His weight remained stable.  Physical Examination: There were no vitals filed for this visit..  Weight: 180 lb 14.4 oz (82.056 kg). There is a confluent mucositis along his oropharynx. Secretions are thick. There is focal moist desquamation along his mid to right lower neck.  Laboratory data:  Lab Results  Component Value Date   WBC 5.9 07/01/2014   HGB 11.4* 07/01/2014   HCT 34.3* 07/01/2014   MCV 94.3 07/01/2014   PLT 364 07/01/2014     Impression: Tolerating radiation therapy remarkably well. He'll finish his treatment this week and then see Dr. Isidore Moos for a followup visit on September 2.   Plan: Continue radiation therapy as planned.

## 2014-07-01 NOTE — Progress Notes (Signed)
Christopher Mullins has completed 32/35 fractions to his right tonsil/bilateral neck.  He reports pain at a 5/10 in his throat.  He is using a fentanyl patch, magic mouthwash and morphine q 3 hours.  He reports a dry mouth with thick saliva. He has sores on both sides of his tongue. He is drinking one shake per day and two gatoraides by mouth and 4 osmolites plus 16 ounces of free water by peg tube. He reports his peg tube is working well.  His weight is stable today.   He is receiving IV fluids today through his port a cath.  The skin on his neck is red with scabbed areas on the right side.  He is using biafine.  He reports fatigue but is active.

## 2014-07-02 ENCOUNTER — Telehealth: Payer: Self-pay | Admitting: *Deleted

## 2014-07-02 ENCOUNTER — Encounter: Payer: Self-pay | Admitting: Hematology and Oncology

## 2014-07-02 ENCOUNTER — Ambulatory Visit
Admission: RE | Admit: 2014-07-02 | Discharge: 2014-07-02 | Disposition: A | Discharge status: Home / Self Care | Payer: BC Managed Care – PPO | Source: Ambulatory Visit | Attending: Radiation Oncology | Admitting: Radiation Oncology

## 2014-07-02 ENCOUNTER — Ambulatory Visit (HOSPITAL_BASED_OUTPATIENT_CLINIC_OR_DEPARTMENT_OTHER): Payer: BC Managed Care – PPO

## 2014-07-02 ENCOUNTER — Ambulatory Visit (HOSPITAL_BASED_OUTPATIENT_CLINIC_OR_DEPARTMENT_OTHER): Payer: BC Managed Care – PPO | Admitting: Hematology and Oncology

## 2014-07-02 ENCOUNTER — Encounter: Payer: Self-pay | Admitting: *Deleted

## 2014-07-02 VITALS — BP 114/57 | HR 105 | Temp 99.0°F | Resp 18 | Ht 68.0 in | Wt 178.6 lb

## 2014-07-02 DIAGNOSIS — C099 Malignant neoplasm of tonsil, unspecified: Secondary | ICD-10-CM

## 2014-07-02 DIAGNOSIS — R634 Abnormal weight loss: Secondary | ICD-10-CM

## 2014-07-02 DIAGNOSIS — R944 Abnormal results of kidney function studies: Secondary | ICD-10-CM

## 2014-07-02 DIAGNOSIS — Z931 Gastrostomy status: Secondary | ICD-10-CM

## 2014-07-02 DIAGNOSIS — R070 Pain in throat: Secondary | ICD-10-CM

## 2014-07-02 DIAGNOSIS — R740 Nonspecific elevation of levels of transaminase and lactic acid dehydrogenase [LDH]: Secondary | ICD-10-CM

## 2014-07-02 DIAGNOSIS — B977 Papillomavirus as the cause of diseases classified elsewhere: Secondary | ICD-10-CM

## 2014-07-02 DIAGNOSIS — D630 Anemia in neoplastic disease: Secondary | ICD-10-CM

## 2014-07-02 DIAGNOSIS — R21 Rash and other nonspecific skin eruption: Secondary | ICD-10-CM

## 2014-07-02 DIAGNOSIS — B1920 Unspecified viral hepatitis C without hepatic coma: Secondary | ICD-10-CM

## 2014-07-02 DIAGNOSIS — R7401 Elevation of levels of liver transaminase levels: Secondary | ICD-10-CM

## 2014-07-02 DIAGNOSIS — K1231 Oral mucositis (ulcerative) due to antineoplastic therapy: Secondary | ICD-10-CM

## 2014-07-02 MED ORDER — SODIUM CHLORIDE 0.9 % IJ SOLN
10.0000 mL | INTRAMUSCULAR | Status: DC | PRN
  Administered 2014-07-02: 10 mL
  Filled 2014-07-02: qty 10

## 2014-07-02 MED ORDER — HYDROMORPHONE HCL PF 4 MG/ML IJ SOLN
INTRAMUSCULAR | Status: AC
  Filled 2014-07-02: qty 1

## 2014-07-02 MED ORDER — FENTANYL 50 MCG/HR TD PT72: 50.0000 ug | MEDICATED_PATCH | TRANSDERMAL | Status: DC

## 2014-07-02 MED ORDER — MORPHINE SULFATE (CONCENTRATE) 20 MG/ML PO SOLN: 20.0000 mg | ORAL | Status: DC | PRN

## 2014-07-02 MED ORDER — HEPARIN SOD (PORK) LOCK FLUSH 100 UNIT/ML IV SOLN
500.0000 [IU] | Freq: Once | INTRAVENOUS | Status: AC | PRN
  Administered 2014-07-02: 500 [IU]
  Filled 2014-07-02: qty 5

## 2014-07-02 MED ORDER — HYDROMORPHONE HCL PF 1 MG/ML IJ SOLN
2.0000 mg | Freq: Every day | INTRAMUSCULAR | Status: DC | PRN
  Administered 2014-07-02: 2 mg via INTRAVENOUS
  Filled 2014-07-02: qty 2

## 2014-07-02 MED ORDER — SODIUM CHLORIDE 0.9 % IV SOLN
1000.0000 mL | Freq: Once | INTRAVENOUS | Status: AC
  Administered 2014-07-02: 14:00:00 via INTRAVENOUS

## 2014-07-02 NOTE — Assessment & Plan Note (Signed)
The patient and need followup with the liver specialist after he completed all his treatment.

## 2014-07-02 NOTE — Assessment & Plan Note (Signed)
His feeding tube looks okay with no signs of infection.

## 2014-07-02 NOTE — Assessment & Plan Note (Signed)
This is likely related to a flare of hepatitis C. Continue to monitor closely.

## 2014-07-02 NOTE — Patient Instructions (Signed)
Dehydration, Adult Dehydration is when you lose more fluids from the body than you take in. Vital organs like the kidneys, brain, and heart cannot function without a proper amount of fluids and salt. Any loss of fluids from the body can cause dehydration.  CAUSES   Vomiting.  Diarrhea.  Excessive sweating.  Excessive urine output.  Fever. SYMPTOMS  Mild dehydration  Thirst.  Dry lips.  Slightly dry mouth. Moderate dehydration  Very dry mouth.  Sunken eyes.  Skin does not bounce back quickly when lightly pinched and released.  Dark urine and decreased urine production.  Decreased tear production.  Headache. Severe dehydration  Very dry mouth.  Extreme thirst.  Rapid, weak pulse (more than 100 beats per minute at rest).  Cold hands and feet.  Not able to sweat in spite of heat and temperature.  Rapid breathing.  Blue lips.  Confusion and lethargy.  Difficulty being awakened.  Minimal urine production.  No tears. DIAGNOSIS  Your caregiver will diagnose dehydration based on your symptoms and your exam. Blood and urine tests will help confirm the diagnosis. The diagnostic evaluation should also identify the cause of dehydration. TREATMENT  Treatment of mild or moderate dehydration can often be done at home by increasing the amount of fluids that you drink. It is best to drink small amounts of fluid more often. Drinking too much at one time can make vomiting worse. Refer to the home care instructions below. Severe dehydration needs to be treated at the hospital where you will probably be given intravenous (IV) fluids that contain water and electrolytes. HOME CARE INSTRUCTIONS   Ask your caregiver about specific rehydration instructions.  Drink enough fluids to keep your urine clear or pale yellow.  Drink small amounts frequently if you have nausea and vomiting.  Eat as you normally do.  Avoid:  Foods or drinks high in sugar.  Carbonated  drinks.  Juice.  Extremely hot or cold fluids.  Drinks with caffeine.  Fatty, greasy foods.  Alcohol.  Tobacco.  Overeating.  Gelatin desserts.  Wash your hands well to avoid spreading bacteria and viruses.  Only take over-the-counter or prescription medicines for pain, discomfort, or fever as directed by your caregiver.  Ask your caregiver if you should continue all prescribed and over-the-counter medicines.  Keep all follow-up appointments with your caregiver. SEEK MEDICAL CARE IF:  You have abdominal pain and it increases or stays in one area (localizes).  You have a rash, stiff neck, or severe headache.  You are irritable, sleepy, or difficult to awaken.  You are weak, dizzy, or extremely thirsty. SEEK IMMEDIATE MEDICAL CARE IF:   You are unable to keep fluids down or you get worse despite treatment.  You have frequent episodes of vomiting or diarrhea.  You have blood or green matter (bile) in your vomit.  You have blood in your stool or your stool looks black and tarry.  You have not urinated in 6 to 8 hours, or you have only urinated a small amount of very dark urine.  You have a fever.  You faint. MAKE SURE YOU:   Understand these instructions.  Will watch your condition.  Will get help right away if you are not doing well or get worse. Document Released: 11/01/2005 Document Revised: 01/24/2012 Document Reviewed: 06/21/2011 ExitCare Patient Information 2015 [employer]. This information is not intended to replace advice given to you by your health care provider. Make sure you discuss any questions you have with your health care   provider.  

## 2014-07-02 NOTE — Assessment & Plan Note (Signed)
This is due to radiation induced skin injury. I recommended topical emollient cream

## 2014-07-02 NOTE — Assessment & Plan Note (Signed)
This is severe and not under control. I will increase the dose of his fentanyl patch as well as liquid morphine sulfate and gave him new prescription.

## 2014-07-02 NOTE — Assessment & Plan Note (Signed)
I recommend conservative management. I will increase his pain medications as noted above.

## 2014-07-02 NOTE — Telephone Encounter (Signed)
Per staff message and POF I have scheduled appts. Advised scheduler of appts. JMW  

## 2014-07-02 NOTE — Assessment & Plan Note (Signed)
Unfortunately, the patient had significant flare of hepatitis with his second dose of cisplatin. The total bilirubin is getting worse although the liver enzymes are improving. I do not feel comfortable prescribing his last dose of cisplatin. We discussed about the risk and benefit of not pursuing the last treatment and he agreed with the current plans. In the meantime, I will continue aggressive supportive therapy.

## 2014-07-02 NOTE — Assessment & Plan Note (Signed)
This is due to difficulties with eating due to to worsening throat pain. Continue to encourage him to increase his tube feeds as tolerated.

## 2014-07-02 NOTE — Progress Notes (Signed)
[facility] OFFICE PROGRESS NOTE  Patient Care Team: Gavin Pound, MD as PCP - General (Family Medicine) Brooks Sailors, RN as Oncology Nurse Navigator (Oncology)  SUMMARY OF ONCOLOGIC HISTORY: Oncology History   Tonsil cancer, Right, HPV positive,    Primary site: Pharynx - Oropharynx   Staging method: AJCC 7th Edition   Clinical: Stage IVA (T1, N2, M0) signed by Heath Lark, MD on 04/17/2014  1:04 PM   Summary: Stage IVA (T1, N2, M0)       Tonsil cancer   03/20/2014 Imaging Ct scan of neck showed several complex solid and cystic lesions within the right neck and abnormalities in the pharynx   03/25/2014 Procedure 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 Multiple hypermetabolic right level 2 lymph nodes compatible with metastatic adenopathy   05/10/2014 Surgery The patient has placement of port and feeding tube.   05/15/2014 - 06/11/2014 Chemotherapy He received high dose cisplatin every 3 weeks. He only received 2 doses due to severe liver function test abnormalities.   05/15/2014 -  Radiation Therapy He received radiation treatment   06/05/2014 Adverse Reaction Cycle 2 of chemotherapy is delayed due to neutropenia.    INTERVAL HISTORY: Please see below for problem oriented charting. He returns today prior to cycle 3 of treatment. His mouth pain is not under control with current pain medication. It is waking him up from sleep at night. He denies any nausea or constipation. He continued to have mild progressive weight loss.  REVIEW OF SYSTEMS:   Constitutional: Denies fevers, chills  Eyes: Denies blurriness of vision Respiratory: Denies cough, dyspnea or wheezes Cardiovascular: Denies palpitation, chest discomfort or lower extremity swelling Gastrointestinal:  Denies nausea,  heartburn or change in bowel habits Lymphatics: Denies new lymphadenopathy or easy bruising Neurological:Denies numbness, tingling or new weaknesses Behavioral/Psych: Mood is stable, no new changes  All other systems were reviewed with the patient and are negative.  I have reviewed the past medical history, past surgical history, social history and family history with the patient and they are unchanged from previous note.  ALLERGIES:  is allergic to sulfa antibiotics.  MEDICATIONS:  Current Outpatient Prescriptions  Medication Sig Dispense Refill  . Alum & Mag Hydroxide-Simeth (MAGIC MOUTHWASH W/LIDOCAINE) SOLN Take 10 mLs by mouth 4 (four) times daily as needed for mouth pain.      Marland Kitchen emollient (BIAFINE) cream Apply 1 application topically 3 (three) times daily.      Marland Kitchen lidocaine-prilocaine (EMLA) cream Apply 1 application topically as needed. Apply to [facility] a Cath site one hour prior to needle stick.  30 g  3  . morphine (ROXANOL) 20 MG/ML concentrated solution Take 1 mL (20 mg total) by mouth every 2 (two) hours as needed for severe pain.  120 mL  0  . ondansetron (ZOFRAN) 8 MG tablet Take 1 tablet (8 mg total) by mouth every 8 (eight) hours as needed for nausea or vomiting. Start on the third day after chemotherapy.  30 tablet  1  . sodium fluoride (FLUORISHIELD) 1.1 % GEL dental gel Instill one drop of fluoride per tooth space of fluoride tray. Place over teeth for 5 minutes. Remove. Spit out excess. Repeat nightly.  120 mL  prn  . fentaNYL (DURAGESIC - DOSED MCG/HR) 50 MCG/HR Place 1 patch (50 mcg total) onto the skin  every 3 (three) days.  5 patch  0  . polyethylene glycol (MIRALAX / GLYCOLAX) packet Take 17 g by mouth daily as needed (for constipation).      . prochlorperazine (COMPAZINE) 10 MG tablet Take 1 tablet (10 mg total) by mouth every 6 (six) hours as needed (Nausea or vomiting).  30 tablet  1   No current facility-administered medications for this visit.    PHYSICAL  EXAMINATION: ECOG PERFORMANCE STATUS: 1 - Symptomatic but completely ambulatory  Filed Vitals:   07/02/14 1248  BP: 114/57  Pulse: 105  Temp: 99 F (37.2 C)  Resp: 18   Filed Weights   07/02/14 1248  Weight: 178 lb 9 oz (80.995 kg)    GENERAL:alert, no distress and comfortable SKIN: No skin ulceration around his neck. EYES: normal, Conjunctiva are jaundiced and non-injected, sclera clear OROPHARYNX: Significant mucositis with ulceration in his mouth. NECK: supple, thyroid normal size, non-tender, without nodularity LYMPH: He has a palpable lymphadenopathy has regressed in size. LUNGS: clear to auscultation and percussion with normal breathing effort HEART: regular rate & rhythm and no murmurs and no lower extremity edema ABDOMEN:abdomen soft, non-tender and normal bowel sounds. Feeding tube site looks okay Musculoskeletal:no cyanosis of digits and no clubbing  NEURO: alert & oriented x 3 with fluent speech, no focal motor/sensory deficits  LABORATORY DATA:  I have reviewed the data as listed    Component Value Date/Time   NA 133* 07/01/2014 1308   NA 129* 06/25/2014 0125   K 4.5 07/01/2014 1308   K 4.4 06/25/2014 0125   CL 93* 06/25/2014 0125   CO2 31* 07/01/2014 1308   CO2 26 06/25/2014 0125   GLUCOSE 132 07/01/2014 1308   GLUCOSE 128* 06/25/2014 0125   BUN 12.0 07/01/2014 1308   BUN 13 06/25/2014 0125   CREATININE 0.8 07/01/2014 1308   CREATININE 0.95 06/25/2014 0125   CALCIUM 8.6 07/01/2014 1308   CALCIUM 8.6 06/25/2014 0125   PROT 6.0* 07/01/2014 1308   PROT 6.4 06/25/2014 0125   ALBUMIN 2.6* 07/01/2014 1308   ALBUMIN 2.9* 06/25/2014 0125   AST 301 Repeated and Verified* 07/01/2014 1308   AST 739* 06/25/2014 0125   ALT 419 Repeated and Verified* 07/01/2014 1308   ALT 1023* 06/25/2014 0125   ALKPHOS 64 07/01/2014 1308   ALKPHOS 62 06/25/2014 0125   BILITOT 2.21* 07/01/2014 1308   BILITOT 1.3* 06/25/2014 0125   GFRNONAA >90 06/25/2014 0125   GFRAA >90 06/25/2014 0125    No results  found for this basename: SPEP, UPEP,  kappa and lambda light chains    Lab Results  Component Value Date   WBC 5.9 07/01/2014   NEUTROABS 2.3 07/01/2014   HGB 11.4* 07/01/2014   HCT 34.3* 07/01/2014   MCV 94.3 07/01/2014   PLT 364 07/01/2014      Chemistry      Component Value Date/Time   NA 133* 07/01/2014 1308   NA 129* 06/25/2014 0125   K 4.5 07/01/2014 1308   K 4.4 06/25/2014 0125   CL 93* 06/25/2014 0125   CO2 31* 07/01/2014 1308   CO2 26 06/25/2014 0125   BUN 12.0 07/01/2014 1308   BUN 13 06/25/2014 0125   CREATININE 0.8 07/01/2014 1308   CREATININE 0.95 06/25/2014 0125      Component Value Date/Time   CALCIUM 8.6 07/01/2014 1308   CALCIUM 8.6 06/25/2014 0125   ALKPHOS 64 07/01/2014 1308   ALKPHOS 62 06/25/2014 0125   AST 301 Repeated and  Verified* 07/01/2014 1308   AST 739* 06/25/2014 0125   ALT 419 Repeated and Verified* 07/01/2014 1308   ALT 1023* 06/25/2014 0125   BILITOT 2.21* 07/01/2014 1308   BILITOT 1.3* 06/25/2014 0125      ASSESSMENT & PLAN:  Tonsil cancer Unfortunately, the patient had significant flare of hepatitis with his second dose of cisplatin. The total bilirubin is getting worse although the liver enzymes are improving. I do not feel comfortable prescribing his last dose of cisplatin. We discussed about the risk and benefit of not pursuing the last treatment and he agreed with the current plans. In the meantime, I will continue aggressive supportive therapy.  Unintentional weight loss This is due to difficulties with eating due to to worsening throat pain. Continue to encourage him to increase his tube feeds as tolerated.    Throat pain This is severe and not under control. I will increase the dose of his fentanyl patch as well as liquid morphine sulfate and gave him new prescription.  S/P percutaneous endoscopic gastrostomy (PEG) tube placement His feeding tube looks okay with no signs of infection.      Rash This is due to radiation induced skin  injury. I recommended topical emollient cream        Mucositis (ulcerative) due to antineoplastic therapy I recommend conservative management. I will increase his pain medications as noted above.      Anemia in neoplastic disease This is likely due to recent treatment. The patient denies recent history of bleeding such as epistaxis, hematuria or hematochezia. He is asymptomatic from the anemia. I will observe for now.     Elevated transaminase level This is likely related to a flare of hepatitis C. Continue to monitor closely.      Hepatitis C The patient and need followup with the liver specialist after he completed all his treatment.   No orders of the defined types were placed in this encounter.   All questions were answered. The patient knows to call the clinic with any problems, questions or concerns. No barriers to learning was detected. I spent 30 minutes counseling the patient face to face. The total time spent in the appointment was 40 minutes and more than 50% was on counseling and review of test results     [facility], [place], MD 07/02/2014 7:58 PM

## 2014-07-02 NOTE — Assessment & Plan Note (Signed)
This is likely due to recent treatment. The patient denies recent history of bleeding such as epistaxis, hematuria or hematochezia. He is asymptomatic from the anemia. I will observe for now.    

## 2014-07-02 NOTE — Progress Notes (Signed)
To provide support, encouragement and care continuity, met with patient and his wife during follow-up appt with Dr. Alvy Bimler.  He reported continued issue with thicken saliva, I encouraged him to increase daily mouth rinses from the once in the morning he stated is his practice to several times a day.  He reported that he is taking medication for breakthrough pain 5-6 times daily; Dr. Alvy Bimler addressed with an increase dosage of Fentanyl.  Dr. Alvy Bimler explained her concern for continuing increased liver chemistries, indicated that unless upcoming lab work dictates otherwise, he will not receive 3rd dose of Cisplatin.  Patient and his wife verbalized understanding.    Gayleen Orem, RN, BSN, [place] [facility] [place] [phone]

## 2014-07-03 ENCOUNTER — Ambulatory Visit (HOSPITAL_BASED_OUTPATIENT_CLINIC_OR_DEPARTMENT_OTHER): Payer: BC Managed Care – PPO

## 2014-07-03 ENCOUNTER — Ambulatory Visit: Payer: BC Managed Care – PPO | Admitting: Nutrition

## 2014-07-03 ENCOUNTER — Ambulatory Visit
Admission: RE | Admit: 2014-07-03 | Discharge: 2014-07-03 | Disposition: A | Discharge status: Home / Self Care | Payer: BC Managed Care – PPO | Source: Ambulatory Visit | Attending: Radiation Oncology | Admitting: Radiation Oncology

## 2014-07-03 VITALS — BP 117/72 | HR 103 | Temp 98.3°F | Resp 18

## 2014-07-03 DIAGNOSIS — R070 Pain in throat: Secondary | ICD-10-CM

## 2014-07-03 DIAGNOSIS — C099 Malignant neoplasm of tonsil, unspecified: Secondary | ICD-10-CM | POA: Diagnosis not present

## 2014-07-03 DIAGNOSIS — R634 Abnormal weight loss: Secondary | ICD-10-CM

## 2014-07-03 MED ORDER — ONDANSETRON 8 MG/50ML IVPB (CHCC)
8.0000 mg | Freq: Every day | INTRAVENOUS | Status: DC | PRN
  Administered 2014-07-03: 8 mg via INTRAVENOUS

## 2014-07-03 MED ORDER — HYDROMORPHONE HCL PF 4 MG/ML IJ SOLN
INTRAMUSCULAR | Status: AC
  Filled 2014-07-03: qty 1

## 2014-07-03 MED ORDER — ONDANSETRON 8 MG/NS 50 ML IVPB
INTRAVENOUS | Status: AC
  Filled 2014-07-03: qty 8

## 2014-07-03 MED ORDER — SODIUM CHLORIDE 0.9 % IJ SOLN
10.0000 mL | INTRAMUSCULAR | Status: DC | PRN
  Filled 2014-07-03: qty 10

## 2014-07-03 MED ORDER — HEPARIN SOD (PORK) LOCK FLUSH 100 UNIT/ML IV SOLN
500.0000 [IU] | Freq: Once | INTRAVENOUS | Status: DC | PRN
  Filled 2014-07-03: qty 5

## 2014-07-03 MED ORDER — HYDROMORPHONE HCL PF 1 MG/ML IJ SOLN
2.0000 mg | Freq: Every day | INTRAMUSCULAR | Status: DC | PRN
  Administered 2014-07-03: 2 mg via INTRAVENOUS
  Filled 2014-07-03: qty 2

## 2014-07-03 MED ORDER — SODIUM CHLORIDE 0.9 % IV SOLN
Freq: Once | INTRAVENOUS | Status: AC
  Administered 2014-07-03: 14:00:00 via INTRAVENOUS

## 2014-07-03 NOTE — Progress Notes (Signed)
Nutrition followup completed with patient.  Weight decreased slightly and documented as 178 pounds.  Patient is tolerating one shake daily by mouth.  He continues to use 4-5 Osmolite 1.5 via feeding tube.  He had one episode of vomiting this morning.  Patient reports he did try to utilize tube feedings too close together and thinks this contributed to vomiting.  Nutrition diagnosis: Food and nutrition related knowledge deficit continues.  Intervention: Patient to continue oral intake by mouth as tolerated gradually increasing oral feeding and decreasing tube feedings. Patient will continue Osmolite 1.5 via tube to promote weight stabilization. Educated patient on proper tube feeding administration. Teach back method used.  Monitoring, evaluation, goals: Patient is tolerating some oral intake and tube feedings.  Next visit: Friday, August 28 in infusion.  **Disclaimer: This note was dictated with voice recognition software. Similar sounding words can inadvertently be transcribed and this note may contain transcription errors which may not have been corrected upon publication of note.**

## 2014-07-03 NOTE — Patient Instructions (Addendum)

## 2014-07-04 ENCOUNTER — Encounter: Payer: Self-pay | Admitting: Radiation Oncology

## 2014-07-04 ENCOUNTER — Ambulatory Visit: Payer: BC Managed Care – PPO

## 2014-07-04 ENCOUNTER — Other Ambulatory Visit (HOSPITAL_BASED_OUTPATIENT_CLINIC_OR_DEPARTMENT_OTHER): Payer: BC Managed Care – PPO

## 2014-07-04 ENCOUNTER — Ambulatory Visit (HOSPITAL_BASED_OUTPATIENT_CLINIC_OR_DEPARTMENT_OTHER): Payer: BC Managed Care – PPO

## 2014-07-04 ENCOUNTER — Ambulatory Visit
Admission: RE | Admit: 2014-07-04 | Discharge: 2014-07-04 | Disposition: A | Discharge status: Home / Self Care | Payer: BC Managed Care – PPO | Source: Ambulatory Visit | Attending: Radiation Oncology | Admitting: Radiation Oncology

## 2014-07-04 ENCOUNTER — Encounter: Payer: Self-pay | Admitting: *Deleted

## 2014-07-04 VITALS — BP 115/70 | HR 89 | Temp 99.1°F | Resp 18

## 2014-07-04 DIAGNOSIS — Z95828 Presence of other vascular implants and grafts: Secondary | ICD-10-CM

## 2014-07-04 DIAGNOSIS — C099 Malignant neoplasm of tonsil, unspecified: Secondary | ICD-10-CM

## 2014-07-04 DIAGNOSIS — R634 Abnormal weight loss: Secondary | ICD-10-CM

## 2014-07-04 DIAGNOSIS — R070 Pain in throat: Secondary | ICD-10-CM

## 2014-07-04 LAB — COMPREHENSIVE METABOLIC PANEL (CC13)
ALT: 300 U/L (ref 0–55)
ANION GAP: 7 meq/L (ref 3–11)
AST: 200 U/L (ref 5–34)
Albumin: 2.6 g/dL — ABNORMAL LOW (ref 3.5–5.0)
Alkaline Phosphatase: 59 U/L (ref 40–150)
BILIRUBIN TOTAL: 1.76 mg/dL — AB (ref 0.20–1.20)
BUN: 12.9 mg/dL (ref 7.0–26.0)
CALCIUM: 8.7 mg/dL (ref 8.4–10.4)
CHLORIDE: 100 meq/L (ref 98–109)
CO2: 27 meq/L (ref 22–29)
CREATININE: 0.9 mg/dL (ref 0.7–1.3)
Glucose: 160 mg/dl — ABNORMAL HIGH (ref 70–140)
Potassium: 4.1 mEq/L (ref 3.5–5.1)
Sodium: 134 mEq/L — ABNORMAL LOW (ref 136–145)
Total Protein: 6.1 g/dL — ABNORMAL LOW (ref 6.4–8.3)

## 2014-07-04 MED ORDER — SODIUM CHLORIDE 0.9 % IV SOLN
Freq: Once | INTRAVENOUS | Status: AC
  Administered 2014-07-04: 11:00:00 via INTRAVENOUS

## 2014-07-04 MED ORDER — HYDROMORPHONE HCL PF 1 MG/ML IJ SOLN
2.0000 mg | Freq: Every day | INTRAMUSCULAR | Status: DC | PRN
  Administered 2014-07-04: 2 mg via INTRAVENOUS
  Filled 2014-07-04: qty 2

## 2014-07-04 MED ORDER — SODIUM CHLORIDE 0.9 % IJ SOLN
10.0000 mL | INTRAMUSCULAR | Status: DC | PRN
  Administered 2014-07-04: 10 mL via INTRAVENOUS
  Filled 2014-07-04: qty 10

## 2014-07-04 MED ORDER — ONDANSETRON 8 MG/NS 50 ML IVPB
INTRAVENOUS | Status: AC
  Filled 2014-07-04: qty 8

## 2014-07-04 MED ORDER — HYDROMORPHONE HCL PF 4 MG/ML IJ SOLN
INTRAMUSCULAR | Status: AC
  Filled 2014-07-04: qty 1

## 2014-07-04 MED ORDER — SODIUM CHLORIDE 0.9 % IJ SOLN
10.0000 mL | INTRAMUSCULAR | Status: DC | PRN
  Filled 2014-07-04: qty 10

## 2014-07-04 MED ORDER — ONDANSETRON 8 MG/50ML IVPB (CHCC)
8.0000 mg | Freq: Every day | INTRAVENOUS | Status: DC | PRN
  Administered 2014-07-04: 8 mg via INTRAVENOUS

## 2014-07-04 MED ORDER — ALTEPLASE 2 MG IJ SOLR
2.0000 mg | Freq: Once | INTRAMUSCULAR | Status: DC | PRN
  Filled 2014-07-04: qty 2

## 2014-07-04 MED ORDER — HEPARIN SOD (PORK) LOCK FLUSH 100 UNIT/ML IV SOLN
250.0000 [IU] | Freq: Once | INTRAVENOUS | Status: DC | PRN
  Filled 2014-07-04: qty 5

## 2014-07-04 MED ORDER — HEPARIN SOD (PORK) LOCK FLUSH 100 UNIT/ML IV SOLN
500.0000 [IU] | Freq: Once | INTRAVENOUS | Status: AC | PRN
  Administered 2014-07-04: 500 [IU]
  Filled 2014-07-04: qty 5

## 2014-07-04 MED ORDER — SODIUM CHLORIDE 0.9 % IJ SOLN
10.0000 mL | INTRAMUSCULAR | Status: DC | PRN
  Administered 2014-07-04: 10 mL
  Filled 2014-07-04: qty 10

## 2014-07-04 NOTE — Progress Notes (Signed)
Met with Christopher Mullins and his family and friends during final RT to offer support and to celebrate end of his radiation treatment.  I acknowledged wife's support and encouragement during his tmts, presented her a Certificate of Recognition.  I explained that my role as navigator will continue for several more months and that I will be calling and/or joining them during follow-up visits.  I encouraged him to contact me should questions or concerns arise, he verbalized understanding.  Christopher Mullins and his wife expressed appreciation.  Initiating navigation as L3 patient (treatments completed) with this encounter.  Gayleen Orem, RN, BSN, [place] at [place] [phone]

## 2014-07-04 NOTE — Patient Instructions (Signed)

## 2014-07-05 ENCOUNTER — Ambulatory Visit (HOSPITAL_BASED_OUTPATIENT_CLINIC_OR_DEPARTMENT_OTHER): Payer: BC Managed Care – PPO

## 2014-07-05 VITALS — BP 115/66 | HR 95 | Temp 97.6°F | Resp 18

## 2014-07-05 DIAGNOSIS — C099 Malignant neoplasm of tonsil, unspecified: Secondary | ICD-10-CM

## 2014-07-05 DIAGNOSIS — R070 Pain in throat: Secondary | ICD-10-CM

## 2014-07-05 MED ORDER — SODIUM CHLORIDE 0.9 % IV SOLN
1000.0000 mL | Freq: Once | INTRAVENOUS | Status: AC
  Administered 2014-07-05: 15:00:00 via INTRAVENOUS

## 2014-07-05 MED ORDER — HYDROMORPHONE HCL PF 4 MG/ML IJ SOLN
INTRAMUSCULAR | Status: AC
  Filled 2014-07-05: qty 1

## 2014-07-05 MED ORDER — SODIUM CHLORIDE 0.9 % IJ SOLN
10.0000 mL | INTRAMUSCULAR | Status: DC | PRN
  Administered 2014-07-05: 10 mL
  Filled 2014-07-05: qty 10

## 2014-07-05 MED ORDER — HEPARIN SOD (PORK) LOCK FLUSH 100 UNIT/ML IV SOLN
500.0000 [IU] | Freq: Once | INTRAVENOUS | Status: AC | PRN
  Administered 2014-07-05: 500 [IU]
  Filled 2014-07-05: qty 5

## 2014-07-05 MED ORDER — HYDROMORPHONE HCL PF 1 MG/ML IJ SOLN
2.0000 mg | Freq: Every day | INTRAMUSCULAR | Status: DC | PRN
Start: 2014-07-05 — End: 2014-07-05
  Administered 2014-07-05: 2 mg via INTRAVENOUS
  Filled 2014-07-05: qty 2

## 2014-07-05 NOTE — Progress Notes (Signed)
  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  He developed confluent mucositis along his oropharynx. Secretions were thick. There was focal moist desquamation along his mid to right lower neck.  Plan: The patient has completed radiation treatment. The patient will return to radiation oncology clinic for routine followup in one half month. I advised them to call or return sooner if they have any questions or concerns related to their recovery or treatment.  -----------------------------------  Eppie Gibson, MD

## 2014-07-05 NOTE — Patient Instructions (Signed)
Dehydration, Adult Dehydration is when you lose more fluids from the body than you take in. Vital organs like the kidneys, brain, and heart cannot function without a proper amount of fluids and salt. Any loss of fluids from the body can cause dehydration.  CAUSES   Vomiting.  Diarrhea.  Excessive sweating.  Excessive urine output.  Fever. SYMPTOMS  Mild dehydration  Thirst.  Dry lips.  Slightly dry mouth. Moderate dehydration  Very dry mouth.  Sunken eyes.  Skin does not bounce back quickly when lightly pinched and released.  Dark urine and decreased urine production.  Decreased tear production.  Headache. Severe dehydration  Very dry mouth.  Extreme thirst.  Rapid, weak pulse (more than 100 beats per minute at rest).  Cold hands and feet.  Not able to sweat in spite of heat and temperature.  Rapid breathing.  Blue lips.  Confusion and lethargy.  Difficulty being awakened.  Minimal urine production.  No tears. DIAGNOSIS  Your caregiver will diagnose dehydration based on your symptoms and your exam. Blood and urine tests will help confirm the diagnosis. The diagnostic evaluation should also identify the cause of dehydration. TREATMENT  Treatment of mild or moderate dehydration can often be done at home by increasing the amount of fluids that you drink. It is best to drink small amounts of fluid more often. Drinking too much at one time can make vomiting worse. Refer to the home care instructions below. Severe dehydration needs to be treated at the hospital where you will probably be given intravenous (IV) fluids that contain water and electrolytes. HOME CARE INSTRUCTIONS   Ask your caregiver about specific rehydration instructions.  Drink enough fluids to keep your urine clear or pale yellow.  Drink small amounts frequently if you have nausea and vomiting.  Eat as you normally do.  Avoid:  Foods or drinks high in sugar.  Carbonated  drinks.  Juice.  Extremely hot or cold fluids.  Drinks with caffeine.  Fatty, greasy foods.  Alcohol.  Tobacco.  Overeating.  Gelatin desserts.  Wash your hands well to avoid spreading bacteria and viruses.  Only take over-the-counter or prescription medicines for pain, discomfort, or fever as directed by your caregiver.  Ask your caregiver if you should continue all prescribed and over-the-counter medicines.  Keep all follow-up appointments with your caregiver. SEEK MEDICAL CARE IF:  You have abdominal pain and it increases or stays in one area (localizes).  You have a rash, stiff neck, or severe headache.  You are irritable, sleepy, or difficult to awaken.  You are weak, dizzy, or extremely thirsty. SEEK IMMEDIATE MEDICAL CARE IF:   You are unable to keep fluids down or you get worse despite treatment.  You have frequent episodes of vomiting or diarrhea.  You have blood or green matter (bile) in your vomit.  You have blood in your stool or your stool looks black and tarry.  You have not urinated in 6 to 8 hours, or you have only urinated a small amount of very dark urine.  You have a fever.  You faint. MAKE SURE YOU:   Understand these instructions.  Will watch your condition.  Will get help right away if you are not doing well or get worse. Document Released: 11/01/2005 Document Revised: 01/24/2012 Document Reviewed: 06/21/2011 ExitCare Patient Information 2015 [employer]. This information is not intended to replace advice given to you by your health care provider. Make sure you discuss any questions you have with your health care   provider.  

## 2014-07-08 ENCOUNTER — Encounter: Payer: Self-pay | Admitting: *Deleted

## 2014-07-08 ENCOUNTER — Ambulatory Visit (HOSPITAL_BASED_OUTPATIENT_CLINIC_OR_DEPARTMENT_OTHER): Payer: BC Managed Care – PPO

## 2014-07-08 DIAGNOSIS — R070 Pain in throat: Secondary | ICD-10-CM

## 2014-07-08 DIAGNOSIS — C099 Malignant neoplasm of tonsil, unspecified: Secondary | ICD-10-CM

## 2014-07-08 MED ORDER — HYDROMORPHONE HCL PF 1 MG/ML IJ SOLN
2.0000 mg | Freq: Every day | INTRAMUSCULAR | Status: DC | PRN
  Administered 2014-07-08: 2 mg via INTRAVENOUS
  Filled 2014-07-08: qty 2

## 2014-07-08 MED ORDER — SODIUM CHLORIDE 0.9 % IV SOLN
1000.0000 mL | Freq: Once | INTRAVENOUS | Status: AC
  Administered 2014-07-08: 1000 mL via INTRAVENOUS

## 2014-07-08 MED ORDER — HYDROMORPHONE HCL PF 4 MG/ML IJ SOLN
INTRAMUSCULAR | Status: AC
  Filled 2014-07-08: qty 1

## 2014-07-08 MED ORDER — SODIUM CHLORIDE 0.9 % IJ SOLN
10.0000 mL | INTRAMUSCULAR | Status: DC | PRN
  Administered 2014-07-08: 10 mL via INTRAVENOUS
  Filled 2014-07-08: qty 10

## 2014-07-08 MED ORDER — HEPARIN SOD (PORK) LOCK FLUSH 100 UNIT/ML IV SOLN
500.0000 [IU] | Freq: Once | INTRAVENOUS | Status: AC
  Administered 2014-07-08: 500 [IU] via INTRAVENOUS
  Filled 2014-07-08: qty 5

## 2014-07-08 NOTE — Patient Instructions (Signed)

## 2014-07-09 ENCOUNTER — Ambulatory Visit (HOSPITAL_BASED_OUTPATIENT_CLINIC_OR_DEPARTMENT_OTHER): Payer: BC Managed Care – PPO

## 2014-07-09 VITALS — BP 122/71 | HR 89 | Temp 98.2°F | Resp 18

## 2014-07-09 DIAGNOSIS — C099 Malignant neoplasm of tonsil, unspecified: Secondary | ICD-10-CM

## 2014-07-09 DIAGNOSIS — E860 Dehydration: Secondary | ICD-10-CM

## 2014-07-09 MED ORDER — SODIUM CHLORIDE 0.9 % IJ SOLN
10.0000 mL | INTRAMUSCULAR | Status: DC | PRN
  Administered 2014-07-09: 10 mL via INTRAVENOUS
  Filled 2014-07-09: qty 10

## 2014-07-09 MED ORDER — SODIUM CHLORIDE 0.9 % IV SOLN
1000.0000 mL | Freq: Once | INTRAVENOUS | Status: AC
  Administered 2014-07-09: 1000 mL via INTRAVENOUS

## 2014-07-09 MED ORDER — HEPARIN SOD (PORK) LOCK FLUSH 100 UNIT/ML IV SOLN
500.0000 [IU] | Freq: Once | INTRAVENOUS | Status: AC
  Administered 2014-07-09: 500 [IU] via INTRAVENOUS
  Filled 2014-07-09: qty 5

## 2014-07-09 NOTE — Patient Instructions (Signed)
Dehydration, Adult Dehydration is when you lose more fluids from the body than you take in. Vital organs like the kidneys, brain, and heart cannot function without a proper amount of fluids and salt. Any loss of fluids from the body can cause dehydration.  CAUSES   Vomiting.  Diarrhea.  Excessive sweating.  Excessive urine output.  Fever. SYMPTOMS  Mild dehydration  Thirst.  Dry lips.  Slightly dry mouth. Moderate dehydration  Very dry mouth.  Sunken eyes.  Skin does not bounce back quickly when lightly pinched and released.  Dark urine and decreased urine production.  Decreased tear production.  Headache. Severe dehydration  Very dry mouth.  Extreme thirst.  Rapid, weak pulse (more than 100 beats per minute at rest).  Cold hands and feet.  Not able to sweat in spite of heat and temperature.  Rapid breathing.  Blue lips.  Confusion and lethargy.  Difficulty being awakened.  Minimal urine production.  No tears. DIAGNOSIS  Your caregiver will diagnose dehydration based on your symptoms and your exam. Blood and urine tests will help confirm the diagnosis. The diagnostic evaluation should also identify the cause of dehydration. TREATMENT  Treatment of mild or moderate dehydration can often be done at home by increasing the amount of fluids that you drink. It is best to drink small amounts of fluid more often. Drinking too much at one time can make vomiting worse. Refer to the home care instructions below. Severe dehydration needs to be treated at the hospital where you will probably be given intravenous (IV) fluids that contain water and electrolytes. HOME CARE INSTRUCTIONS   Ask your caregiver about specific rehydration instructions.  Drink enough fluids to keep your urine clear or pale yellow.  Drink small amounts frequently if you have nausea and vomiting.  Eat as you normally do.  Avoid:  Foods or drinks high in sugar.  Carbonated  drinks.  Juice.  Extremely hot or cold fluids.  Drinks with caffeine.  Fatty, greasy foods.  Alcohol.  Tobacco.  Overeating.  Gelatin desserts.  Wash your hands well to avoid spreading bacteria and viruses.  Only take over-the-counter or prescription medicines for pain, discomfort, or fever as directed by your caregiver.  Ask your caregiver if you should continue all prescribed and over-the-counter medicines.  Keep all follow-up appointments with your caregiver. SEEK MEDICAL CARE IF:  You have abdominal pain and it increases or stays in one area (localizes).  You have a rash, stiff neck, or severe headache.  You are irritable, sleepy, or difficult to awaken.  You are weak, dizzy, or extremely thirsty. SEEK IMMEDIATE MEDICAL CARE IF:   You are unable to keep fluids down or you get worse despite treatment.  You have frequent episodes of vomiting or diarrhea.  You have blood or green matter (bile) in your vomit.  You have blood in your stool or your stool looks black and tarry.  You have not urinated in 6 to 8 hours, or you have only urinated a small amount of very dark urine.  You have a fever.  You faint. MAKE SURE YOU:   Understand these instructions.  Will watch your condition.  Will get help right away if you are not doing well or get worse. Document Released: 11/01/2005 Document Revised: 01/24/2012 Document Reviewed: 06/21/2011 ExitCare Patient Information 2015 [employer]. This information is not intended to replace advice given to you by your health care provider. Make sure you discuss any questions you have with your health care   provider.  

## 2014-07-10 ENCOUNTER — Other Ambulatory Visit: Payer: Self-pay | Admitting: Hematology and Oncology

## 2014-07-10 ENCOUNTER — Other Ambulatory Visit (HOSPITAL_BASED_OUTPATIENT_CLINIC_OR_DEPARTMENT_OTHER): Payer: BC Managed Care – PPO

## 2014-07-10 ENCOUNTER — Ambulatory Visit (HOSPITAL_BASED_OUTPATIENT_CLINIC_OR_DEPARTMENT_OTHER): Payer: BC Managed Care – PPO | Admitting: Hematology and Oncology

## 2014-07-10 ENCOUNTER — Telehealth: Payer: Self-pay | Admitting: Hematology and Oncology

## 2014-07-10 ENCOUNTER — Other Ambulatory Visit: Payer: Self-pay | Admitting: *Deleted

## 2014-07-10 ENCOUNTER — Encounter: Payer: Self-pay | Admitting: Hematology and Oncology

## 2014-07-10 ENCOUNTER — Ambulatory Visit (HOSPITAL_BASED_OUTPATIENT_CLINIC_OR_DEPARTMENT_OTHER): Payer: BC Managed Care – PPO

## 2014-07-10 VITALS — BP 128/77 | HR 107 | Temp 98.5°F | Resp 20 | Ht 68.0 in | Wt 171.3 lb

## 2014-07-10 DIAGNOSIS — C099 Malignant neoplasm of tonsil, unspecified: Secondary | ICD-10-CM

## 2014-07-10 DIAGNOSIS — Z931 Gastrostomy status: Secondary | ICD-10-CM

## 2014-07-10 DIAGNOSIS — R070 Pain in throat: Secondary | ICD-10-CM

## 2014-07-10 DIAGNOSIS — B1920 Unspecified viral hepatitis C without hepatic coma: Secondary | ICD-10-CM

## 2014-07-10 DIAGNOSIS — R740 Nonspecific elevation of levels of transaminase and lactic acid dehydrogenase [LDH]: Secondary | ICD-10-CM

## 2014-07-10 DIAGNOSIS — B370 Candidal stomatitis: Secondary | ICD-10-CM

## 2014-07-10 DIAGNOSIS — E860 Dehydration: Secondary | ICD-10-CM

## 2014-07-10 DIAGNOSIS — R21 Rash and other nonspecific skin eruption: Secondary | ICD-10-CM

## 2014-07-10 DIAGNOSIS — R5381 Other malaise: Secondary | ICD-10-CM

## 2014-07-10 DIAGNOSIS — R5383 Other fatigue: Secondary | ICD-10-CM

## 2014-07-10 DIAGNOSIS — R634 Abnormal weight loss: Secondary | ICD-10-CM

## 2014-07-10 DIAGNOSIS — R7402 Elevation of levels of lactic acid dehydrogenase (LDH): Secondary | ICD-10-CM

## 2014-07-10 DIAGNOSIS — D630 Anemia in neoplastic disease: Secondary | ICD-10-CM

## 2014-07-10 DIAGNOSIS — R7401 Elevation of levels of liver transaminase levels: Secondary | ICD-10-CM

## 2014-07-10 HISTORY — DX: Candidal stomatitis: B37.0

## 2014-07-10 LAB — COMPREHENSIVE METABOLIC PANEL (CC13)
ALBUMIN: 2.6 g/dL — AB (ref 3.5–5.0)
ALK PHOS: 49 U/L (ref 40–150)
ALT: 86 U/L — ABNORMAL HIGH (ref 0–55)
AST: 46 U/L — ABNORMAL HIGH (ref 5–34)
Anion Gap: 5 mEq/L (ref 3–11)
BUN: 10.6 mg/dL (ref 7.0–26.0)
CO2: 31 mEq/L — ABNORMAL HIGH (ref 22–29)
CREATININE: 0.8 mg/dL (ref 0.7–1.3)
Calcium: 9.1 mg/dL (ref 8.4–10.4)
Chloride: 103 mEq/L (ref 98–109)
Glucose: 168 mg/dl — ABNORMAL HIGH (ref 70–140)
POTASSIUM: 4.1 meq/L (ref 3.5–5.1)
Sodium: 138 mEq/L (ref 136–145)
Total Bilirubin: 1.12 mg/dL (ref 0.20–1.20)
Total Protein: 6.6 g/dL (ref 6.4–8.3)

## 2014-07-10 MED ORDER — FLUCONAZOLE 100 MG PO TABS: 100.0000 mg | ORAL_TABLET | Freq: Every day | ORAL | Status: DC

## 2014-07-10 MED ORDER — SODIUM CHLORIDE 0.9 % IV SOLN
1000.0000 mL | Freq: Once | INTRAVENOUS | Status: AC
  Administered 2014-07-10: 12:00:00 via INTRAVENOUS

## 2014-07-10 MED ORDER — HYDROMORPHONE HCL PF 4 MG/ML IJ SOLN
INTRAMUSCULAR | Status: AC
  Filled 2014-07-10: qty 1

## 2014-07-10 MED ORDER — HEPARIN SOD (PORK) LOCK FLUSH 100 UNIT/ML IV SOLN
500.0000 [IU] | Freq: Once | INTRAVENOUS | Status: AC | PRN
  Administered 2014-07-10: 500 [IU]
  Filled 2014-07-10: qty 5

## 2014-07-10 MED ORDER — HYDROMORPHONE HCL PF 1 MG/ML IJ SOLN
2.0000 mg | Freq: Every day | INTRAMUSCULAR | Status: DC | PRN
  Administered 2014-07-10: 2 mg via INTRAVENOUS
  Filled 2014-07-10: qty 2

## 2014-07-10 MED ORDER — SODIUM CHLORIDE 0.9 % IJ SOLN
10.0000 mL | INTRAMUSCULAR | Status: DC | PRN
  Administered 2014-07-10: 10 mL
  Filled 2014-07-10: qty 10

## 2014-07-10 NOTE — Telephone Encounter (Signed)
s.w. pt and advised on Sept appt...pt ok and aware and will pick up new sched on tomorrow

## 2014-07-10 NOTE — Assessment & Plan Note (Signed)
This has improved since discontinuation of radiation. He will continue topical emollient cream

## 2014-07-10 NOTE — Assessment & Plan Note (Signed)
He will complete IV fluid treatment to the end of the week.

## 2014-07-10 NOTE — Assessment & Plan Note (Signed)
The patient lost weight due to reduce oral intake. I recommend he increase oral intake again by mouth if tolerated.

## 2014-07-10 NOTE — Progress Notes (Signed)
Christopher Mullins OFFICE PROGRESS NOTE  Patient Care Team: Gavin Pound, MD as PCP - General (Family Medicine) Brooks Sailors, RN as Oncology Nurse Navigator (Oncology)  SUMMARY OF ONCOLOGIC HISTORY: Oncology History   Tonsil cancer, Right, HPV positive,    Primary site: Pharynx - Oropharynx   Staging method: AJCC 7th Edition   Clinical: Stage IVA (T1, N2, M0) signed by Heath Lark, MD on 04/17/2014  1:04 PM   Summary: Stage IVA (T1, N2, M0)       Tonsil cancer   03/20/2014 Imaging Ct scan of neck showed several complex solid and cystic lesions within the right neck and abnormalities in the pharynx   03/25/2014 Procedure 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 Multiple hypermetabolic right level 2 lymph nodes compatible with metastatic adenopathy   05/10/2014 Surgery The patient has placement of port and feeding tube.   05/15/2014 - 06/11/2014 Chemotherapy He received high dose cisplatin every 3 weeks. He only received 2 doses due to severe liver function test abnormalities.   05/15/2014 - 07/04/2014 Radiation Therapy He received radiation treatment   06/05/2014 Adverse Reaction Cycle 2 of chemotherapy is delayed due to neutropenia & cycle 3 was abondoned due to severe hepatitis    INTERVAL HISTORY: Please see below for problem oriented charting. The patient is here for followup care after he completed chemoradiation treatment. He has lost some weight because he has lack of motivation to eat. He denies nausea or vomiting. He has persistent throat pain and the current pain medication is controlling his pain.  REVIEW OF SYSTEMS:   Constitutional: Denies fevers, chills  Eyes: Denies blurriness of vision Ears, nose, mouth, throat, and face: Denies mucositis  Respiratory: Denies cough,  dyspnea or wheezes Cardiovascular: Denies palpitation, chest discomfort or lower extremity swelling Gastrointestinal:  Denies nausea, heartburn or change in bowel habits Skin: Denies abnormal skin rashes Lymphatics: Denies new lymphadenopathy or easy bruising Neurological:Denies numbness, tingling or new weaknesses Behavioral/Psych: Mood is stable, no new changes  All other systems were reviewed with the patient and are negative.  I have reviewed the past medical history, past surgical history, social history and family history with the patient and they are unchanged from previous note.  ALLERGIES:  is allergic to sulfa antibiotics.  MEDICATIONS:  Current Outpatient Prescriptions  Medication Sig Dispense Refill  . Alum & Mag Hydroxide-Simeth (MAGIC MOUTHWASH W/LIDOCAINE) SOLN Take 10 mLs by mouth 4 (four) times daily as needed for mouth pain.      Marland Kitchen emollient (BIAFINE) cream Apply 1 application topically 3 (three) times daily.      . fentaNYL (DURAGESIC - DOSED MCG/HR) 50 MCG/HR Place 1 patch (50 mcg total) onto the skin every 3 (three) days.  5 patch  0  . lidocaine-prilocaine (EMLA) cream Apply 1 application topically as needed. Apply to [facility] a Cath site one hour prior to needle stick.  30 g  3  . morphine (ROXANOL) 20 MG/ML concentrated solution Take 1 mL (20 mg total) by mouth every 2 (two) hours as needed for severe pain.  120 mL  0  . ondansetron (ZOFRAN) 8 MG tablet Take 1 tablet (8 mg total) by mouth every 8 (eight) hours as needed for nausea or vomiting. Start on the third day after chemotherapy.  30 tablet  1  . polyethylene glycol (MIRALAX /  GLYCOLAX) packet Take 17 g by mouth daily as needed (for constipation).      . prochlorperazine (COMPAZINE) 10 MG tablet Take 1 tablet (10 mg total) by mouth every 6 (six) hours as needed (Nausea or vomiting).  30 tablet  1  . sodium fluoride (FLUORISHIELD) 1.1 % GEL dental gel Instill one drop of fluoride per tooth space of fluoride tray.  Place over teeth for 5 minutes. Remove. Spit out excess. Repeat nightly.  120 mL  prn  . fluconazole (DIFLUCAN) 100 MG tablet Take 1 tablet (100 mg total) by mouth daily.  10 tablet  0   No current facility-administered medications for this visit.   Facility-Administered Medications Ordered in Other Visits  Medication Dose Route Frequency Provider Last Rate Last Dose  . heparin lock flush 100 unit/mL  500 Units Intracatheter Once PRN Heath Lark, MD      . HYDROmorphone (DILAUDID) injection 2 mg  2 mg Intravenous Daily PRN Heath Lark, MD   2 mg at 07/10/14 1221  . sodium chloride 0.9 % injection 10 mL  10 mL Intracatheter PRN Heath Lark, MD        PHYSICAL EXAMINATION: ECOG PERFORMANCE STATUS: 1 - Symptomatic but completely ambulatory  Filed Vitals:   07/10/14 1105  BP: 128/77  Pulse: 107  Temp: 98.5 F (36.9 C)  Resp: 20   Filed Weights   07/10/14 1105  Weight: 171 lb 4.8 oz (77.701 kg)    GENERAL:alert, no distress and comfortable SKIN: skin color, texture, turgor are normal, no rashes or significant lesions EYES: normal, Conjunctiva are pink and non-injected, sclera clear OROPHARYNX: Persistent mucositis, improve compared to prior exam . Oral thrush is noted NECK: supple, thyroid normal size, non-tender, without nodularity LYMPH:  no palpable lymphadenopathy in the cervical, axillary or inguinal LUNGS: clear to auscultation and percussion with normal breathing effort HEART: regular rate & rhythm and no murmurs and no lower extremity edema ABDOMEN:abdomen soft, non-tender and normal bowel sounds. Feeding tube site looks okay Musculoskeletal:no cyanosis of digits and no clubbing  NEURO: alert & oriented x 3 with fluent speech, no focal motor/sensory deficits  LABORATORY DATA:  I have reviewed the data as listed    Component Value Date/Time   NA 138 07/10/2014 1044   NA 129* 06/25/2014 0125   K 4.1 07/10/2014 1044   K 4.4 06/25/2014 0125   CL 93* 06/25/2014 0125   CO2 31*  07/10/2014 1044   CO2 26 06/25/2014 0125   GLUCOSE 168* 07/10/2014 1044   GLUCOSE 128* 06/25/2014 0125   BUN 10.6 07/10/2014 1044   BUN 13 06/25/2014 0125   CREATININE 0.8 07/10/2014 1044   CREATININE 0.95 06/25/2014 0125   CALCIUM 9.1 07/10/2014 1044   CALCIUM 8.6 06/25/2014 0125   PROT 6.6 07/10/2014 1044   PROT 6.4 06/25/2014 0125   ALBUMIN 2.6* 07/10/2014 1044   ALBUMIN 2.9* 06/25/2014 0125   AST 46* 07/10/2014 1044   AST 739* 06/25/2014 0125   ALT 86* 07/10/2014 1044   ALT 1023* 06/25/2014 0125   ALKPHOS 49 07/10/2014 1044   ALKPHOS 62 06/25/2014 0125   BILITOT 1.12 07/10/2014 1044   BILITOT 1.3* 06/25/2014 0125   GFRNONAA >90 06/25/2014 0125   GFRAA >90 06/25/2014 0125    No results found for this basename: SPEP, UPEP,  kappa and lambda light chains    Lab Results  Component Value Date   WBC 5.9 07/01/2014   NEUTROABS 2.3 07/01/2014   HGB 11.4* 07/01/2014  HCT 34.3* 07/01/2014   MCV 94.3 07/01/2014   PLT 364 07/01/2014      Chemistry      Component Value Date/Time   NA 138 07/10/2014 1044   NA 129* 06/25/2014 0125   K 4.1 07/10/2014 1044   K 4.4 06/25/2014 0125   CL 93* 06/25/2014 0125   CO2 31* 07/10/2014 1044   CO2 26 06/25/2014 0125   BUN 10.6 07/10/2014 1044   BUN 13 06/25/2014 0125   CREATININE 0.8 07/10/2014 1044   CREATININE 0.95 06/25/2014 0125      Component Value Date/Time   CALCIUM 9.1 07/10/2014 1044   CALCIUM 8.6 06/25/2014 0125   ALKPHOS 49 07/10/2014 1044   ALKPHOS 62 06/25/2014 0125   AST 46* 07/10/2014 1044   AST 739* 06/25/2014 0125   ALT 86* 07/10/2014 1044   ALT 1023* 06/25/2014 0125   BILITOT 1.12 07/10/2014 1044   BILITOT 1.3* 06/25/2014 0125     ASSESSMENT & PLAN:  Tonsil cancer Unfortunately, the patient had significant flare of hepatitis with his second dose of cisplatin. The total bilirubin is getting worse although the liver enzymes are improving. I do not feel comfortable prescribing his last dose of cisplatin.  The patient had completed all his treatment In  the meantime, I will continue aggressive supportive therapy.    Unintentional weight loss The patient lost weight due to reduce oral intake. I recommend he increase oral intake again by mouth if tolerated.  Throat pain His pain is adequately controlled with current fentanyl patch and morphine.  Thrush He has significant thrush. I gave him prescription fluconazole to take for one week.  S/P percutaneous endoscopic gastrostomy (PEG) tube placement His feeding tube looks okay with no signs of infection.        Rash This has improved since discontinuation of radiation. He will continue topical emollient cream  Hepatitis C The patient and need followup with the liver specialist after his clinical condition stabilized.    Elevated transaminase level  This has improved. Continue conservative management  Dehydration He will complete IV fluid treatment to the end of the week.  Anemia in neoplastic disease This is likely due to recent treatment. The patient denies recent history of bleeding such as epistaxis, hematuria or hematochezia. He is asymptomatic from the anemia. I will observe for now.        No orders of the defined types were placed in this encounter.   All questions were answered. The patient knows to call the clinic with any problems, questions or concerns. No barriers to learning was detected. I spent 30 minutes counseling the patient face to face. The total time spent in the appointment was 40 minutes and more than 50% was on counseling and review of test results     [facility], [place], MD 07/10/2014 2:15 PM

## 2014-07-10 NOTE — Assessment & Plan Note (Signed)
This is likely due to recent treatment. The patient denies recent history of bleeding such as epistaxis, hematuria or hematochezia. He is asymptomatic from the anemia. I will observe for now.    

## 2014-07-10 NOTE — Assessment & Plan Note (Signed)
His pain is adequately controlled with current fentanyl patch and morphine.

## 2014-07-10 NOTE — Assessment & Plan Note (Signed)
This has improved. Continue conservative management

## 2014-07-10 NOTE — Assessment & Plan Note (Signed)
Unfortunately, the patient had significant flare of hepatitis with his second dose of cisplatin. The total bilirubin is getting worse although the liver enzymes are improving. I do not feel comfortable prescribing his last dose of cisplatin.  The patient had completed all his treatment In the meantime, I will continue aggressive supportive therapy.

## 2014-07-10 NOTE — Assessment & Plan Note (Signed)
He has significant thrush. I gave him prescription fluconazole to take for one week.

## 2014-07-10 NOTE — Assessment & Plan Note (Signed)
The patient and need followup with the liver specialist after his clinical condition stabilized.

## 2014-07-10 NOTE — Assessment & Plan Note (Signed)
His feeding tube looks okay with no signs of infection.

## 2014-07-11 ENCOUNTER — Ambulatory Visit (HOSPITAL_BASED_OUTPATIENT_CLINIC_OR_DEPARTMENT_OTHER): Payer: BC Managed Care – PPO

## 2014-07-11 VITALS — BP 125/73 | HR 88 | Temp 98.6°F | Resp 16

## 2014-07-11 DIAGNOSIS — R070 Pain in throat: Secondary | ICD-10-CM

## 2014-07-11 DIAGNOSIS — C099 Malignant neoplasm of tonsil, unspecified: Secondary | ICD-10-CM

## 2014-07-11 MED ORDER — SODIUM CHLORIDE 0.9 % IJ SOLN
10.0000 mL | INTRAMUSCULAR | Status: DC | PRN
  Administered 2014-07-11: 10 mL
  Filled 2014-07-11: qty 10

## 2014-07-11 MED ORDER — HEPARIN SOD (PORK) LOCK FLUSH 100 UNIT/ML IV SOLN
500.0000 [IU] | Freq: Once | INTRAVENOUS | Status: AC | PRN
  Administered 2014-07-11: 500 [IU]
  Filled 2014-07-11: qty 5

## 2014-07-11 MED ORDER — SODIUM CHLORIDE 0.9 % IV SOLN
Freq: Once | INTRAVENOUS | Status: AC
  Administered 2014-07-11: 14:00:00 via INTRAVENOUS

## 2014-07-11 MED ORDER — HYDROMORPHONE HCL PF 1 MG/ML IJ SOLN
2.0000 mg | Freq: Every day | INTRAMUSCULAR | Status: DC | PRN
  Administered 2014-07-11: 2 mg via INTRAVENOUS
  Filled 2014-07-11: qty 2

## 2014-07-11 MED ORDER — HYDROMORPHONE HCL PF 4 MG/ML IJ SOLN
INTRAMUSCULAR | Status: AC
  Filled 2014-07-11: qty 1

## 2014-07-11 NOTE — Progress Notes (Signed)
Met with patient briefly in Infusion as he was finishing up IVF.  He reported he has been "feeling good" since completing RT last week.  He expressed understanding that he will continue with daily IVF for the remainder of the week.  He did not express any needs, I encouraged him to contact me should that change.  He expressed understanding.  Gayleen Orem, RN, BSN, [place] at [place] [phone]

## 2014-07-11 NOTE — Patient Instructions (Signed)
Dehydration, Adult Dehydration is when you lose more fluids from the body than you take in. Vital organs like the kidneys, brain, and heart cannot function without a proper amount of fluids and salt. Any loss of fluids from the body can cause dehydration.  CAUSES   Vomiting.  Diarrhea.  Excessive sweating.  Excessive urine output.  Fever. SYMPTOMS  Mild dehydration  Thirst.  Dry lips.  Slightly dry mouth. Moderate dehydration  Very dry mouth.  Sunken eyes.  Skin does not bounce back quickly when lightly pinched and released.  Dark urine and decreased urine production.  Decreased tear production.  Headache. Severe dehydration  Very dry mouth.  Extreme thirst.  Rapid, weak pulse (more than 100 beats per minute at rest).  Cold hands and feet.  Not able to sweat in spite of heat and temperature.  Rapid breathing.  Blue lips.  Confusion and lethargy.  Difficulty being awakened.  Minimal urine production.  No tears. DIAGNOSIS  Your caregiver will diagnose dehydration based on your symptoms and your exam. Blood and urine tests will help confirm the diagnosis. The diagnostic evaluation should also identify the cause of dehydration. TREATMENT  Treatment of mild or moderate dehydration can often be done at home by increasing the amount of fluids that you drink. It is best to drink small amounts of fluid more often. Drinking too much at one time can make vomiting worse. Refer to the home care instructions below. Severe dehydration needs to be treated at the hospital where you will probably be given intravenous (IV) fluids that contain water and electrolytes. HOME CARE INSTRUCTIONS   Ask your caregiver about specific rehydration instructions.  Drink enough fluids to keep your urine clear or pale yellow.  Drink small amounts frequently if you have nausea and vomiting.  Eat as you normally do.  Avoid:  Foods or drinks high in sugar.  Carbonated  drinks.  Juice.  Extremely hot or cold fluids.  Drinks with caffeine.  Fatty, greasy foods.  Alcohol.  Tobacco.  Overeating.  Gelatin desserts.  Wash your hands well to avoid spreading bacteria and viruses.  Only take over-the-counter or prescription medicines for pain, discomfort, or fever as directed by your caregiver.  Ask your caregiver if you should continue all prescribed and over-the-counter medicines.  Keep all follow-up appointments with your caregiver. SEEK MEDICAL CARE IF:  You have abdominal pain and it increases or stays in one area (localizes).  You have a rash, stiff neck, or severe headache.  You are irritable, sleepy, or difficult to awaken.  You are weak, dizzy, or extremely thirsty. SEEK IMMEDIATE MEDICAL CARE IF:   You are unable to keep fluids down or you get worse despite treatment.  You have frequent episodes of vomiting or diarrhea.  You have blood or green matter (bile) in your vomit.  You have blood in your stool or your stool looks black and tarry.  You have not urinated in 6 to 8 hours, or you have only urinated a small amount of very dark urine.  You have a fever.  You faint. MAKE SURE YOU:   Understand these instructions.  Will watch your condition.  Will get help right away if you are not doing well or get worse. Document Released: 11/01/2005 Document Revised: 01/24/2012 Document Reviewed: 06/21/2011 ExitCare Patient Information 2015 [employer]. This information is not intended to replace advice given to you by your health care provider. Make sure you discuss any questions you have with your health care   provider.  

## 2014-07-12 ENCOUNTER — Encounter: Payer: Self-pay | Admitting: Nutrition

## 2014-07-12 ENCOUNTER — Telehealth: Payer: Self-pay | Admitting: Dietician

## 2014-07-12 ENCOUNTER — Ambulatory Visit (HOSPITAL_BASED_OUTPATIENT_CLINIC_OR_DEPARTMENT_OTHER): Payer: BC Managed Care – PPO

## 2014-07-12 VITALS — BP 117/66 | HR 86 | Temp 98.7°F | Resp 18

## 2014-07-12 DIAGNOSIS — R070 Pain in throat: Secondary | ICD-10-CM

## 2014-07-12 DIAGNOSIS — C099 Malignant neoplasm of tonsil, unspecified: Secondary | ICD-10-CM

## 2014-07-12 MED ORDER — HYDROMORPHONE HCL PF 4 MG/ML IJ SOLN
INTRAMUSCULAR | Status: AC
  Filled 2014-07-12: qty 1

## 2014-07-12 MED ORDER — HEPARIN SOD (PORK) LOCK FLUSH 100 UNIT/ML IV SOLN
500.0000 [IU] | Freq: Once | INTRAVENOUS | Status: AC | PRN
  Administered 2014-07-12: 500 [IU]
  Filled 2014-07-12: qty 5

## 2014-07-12 MED ORDER — HYDROMORPHONE HCL PF 1 MG/ML IJ SOLN
2.0000 mg | Freq: Every day | INTRAMUSCULAR | Status: DC | PRN
Start: 2014-07-12 — End: 2014-07-12
  Administered 2014-07-12: 2 mg via INTRAVENOUS
  Filled 2014-07-12: qty 2

## 2014-07-12 MED ORDER — SODIUM CHLORIDE 0.9 % IV SOLN
1000.0000 mL | Freq: Once | INTRAVENOUS | Status: AC
  Administered 2014-07-12: 1000 mL via INTRAVENOUS

## 2014-07-12 MED ORDER — SODIUM CHLORIDE 0.9 % IJ SOLN
10.0000 mL | INTRAMUSCULAR | Status: DC | PRN
  Administered 2014-07-12: 10 mL
  Filled 2014-07-12: qty 10

## 2014-07-12 NOTE — Patient Instructions (Signed)
Dehydration, Adult Dehydration is when you lose more fluids from the body than you take in. Vital organs like the kidneys, brain, and heart cannot function without a proper amount of fluids and salt. Any loss of fluids from the body can cause dehydration.  CAUSES   Vomiting.  Diarrhea.  Excessive sweating.  Excessive urine output.  Fever. SYMPTOMS  Mild dehydration  Thirst.  Dry lips.  Slightly dry mouth. Moderate dehydration  Very dry mouth.  Sunken eyes.  Skin does not bounce back quickly when lightly pinched and released.  Dark urine and decreased urine production.  Decreased tear production.  Headache. Severe dehydration  Very dry mouth.  Extreme thirst.  Rapid, weak pulse (more than 100 beats per minute at rest).  Cold hands and feet.  Not able to sweat in spite of heat and temperature.  Rapid breathing.  Blue lips.  Confusion and lethargy.  Difficulty being awakened.  Minimal urine production.  No tears. DIAGNOSIS  Your caregiver will diagnose dehydration based on your symptoms and your exam. Blood and urine tests will help confirm the diagnosis. The diagnostic evaluation should also identify the cause of dehydration. TREATMENT  Treatment of mild or moderate dehydration can often be done at home by increasing the amount of fluids that you drink. It is best to drink small amounts of fluid more often. Drinking too much at one time can make vomiting worse. Refer to the home care instructions below. Severe dehydration needs to be treated at the hospital where you will probably be given intravenous (IV) fluids that contain water and electrolytes. HOME CARE INSTRUCTIONS   Ask your caregiver about specific rehydration instructions.  Drink enough fluids to keep your urine clear or pale yellow.  Drink small amounts frequently if you have nausea and vomiting.  Eat as you normally do.  Avoid:  Foods or drinks high in sugar.  Carbonated  drinks.  Juice.  Extremely hot or cold fluids.  Drinks with caffeine.  Fatty, greasy foods.  Alcohol.  Tobacco.  Overeating.  Gelatin desserts.  Wash your hands well to avoid spreading bacteria and viruses.  Only take over-the-counter or prescription medicines for pain, discomfort, or fever as directed by your caregiver.  Ask your caregiver if you should continue all prescribed and over-the-counter medicines.  Keep all follow-up appointments with your caregiver. SEEK MEDICAL CARE IF:  You have abdominal pain and it increases or stays in one area (localizes).  You have a rash, stiff neck, or severe headache.  You are irritable, sleepy, or difficult to awaken.  You are weak, dizzy, or extremely thirsty. SEEK IMMEDIATE MEDICAL CARE IF:   You are unable to keep fluids down or you get worse despite treatment.  You have frequent episodes of vomiting or diarrhea.  You have blood or green matter (bile) in your vomit.  You have blood in your stool or your stool looks black and tarry.  You have not urinated in 6 to 8 hours, or you have only urinated a small amount of very dark urine.  You have a fever.  You faint. MAKE SURE YOU:   Understand these instructions.  Will watch your condition.  Will get help right away if you are not doing well or get worse. Document Released: 11/01/2005 Document Revised: 01/24/2012 Document Reviewed: 06/21/2011 ExitCare Patient Information 2015 [employer]. This information is not intended to replace advice given to you by your health care provider. Make sure you discuss any questions you have with your health care   provider.  

## 2014-07-12 NOTE — Telephone Encounter (Signed)
Nutrition Note  Patient reported being out of Tube Feeding for the weekend.  Usually uses Osmolite 1.5, 4-5 cans daily.  States that XRT has ended and that he was able to drink 2 shakes today.  Patient requested more TF to get him through the weekend.  Informed patient that we did not have the Osmolite 1.5 and that it is available at the [facility] (24 for $35) or I could give a substitute (Jevity 1.2).  Patient opted for the substitute.  Discussed the differences with the patient.  Patient stated that he was able to drink more and may not need it.  "Maybe it will motivate me to take more by mouth."    Patient reports that he will return Monday.  Will notify the [place] RD who will follow up as needed.  Provided 8 cans of Jevity 1.2 at the reception desk for the patient to pick up.  Weight noted to be down to 171 lbs.  Antonieta Iba, RD, LDN

## 2014-07-15 ENCOUNTER — Other Ambulatory Visit: Payer: Self-pay | Admitting: *Deleted

## 2014-07-15 ENCOUNTER — Telehealth: Payer: Self-pay | Admitting: Hematology and Oncology

## 2014-07-15 NOTE — Telephone Encounter (Signed)
lvm for pt regarding to added lab for 9.8 per pof...mailed pt appt sched/avs and letter

## 2014-07-16 ENCOUNTER — Encounter: Payer: Self-pay | Admitting: Radiation Oncology

## 2014-07-16 NOTE — Progress Notes (Addendum)
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 He is currently drinking Carnation instant breakfast - 4 cans daily with Osmolite twice daily via PEG starting this week, vs. 5 cans of Osmolite as suggested per dietician. Also drinking gatorade.  Will try eggs today. He reports pain upon swallowing as a level 6/10. No longer using his his Fentanyl patch,only using Morphine. Orthostatic by pulse. Not lightheaded.                             ALLERGIES:  is allergic to sulfa antibiotics.  Meds: Current Outpatient Prescriptions  Medication Sig Dispense Refill  . Alum & Mag Hydroxide-Simeth (MAGIC MOUTHWASH W/LIDOCAINE) SOLN Take 10 mLs by mouth 4 (four) times daily as needed for mouth pain.      Marland Kitchen emollient (BIAFINE) cream Apply 1 application topically 3 (three) times daily.      . fluconazole (DIFLUCAN) 100 MG tablet Take 1 tablet (100 mg total) by mouth daily.  10 tablet  0  . lidocaine-prilocaine (EMLA) cream Apply 1 application topically as needed. Apply to [facility] a Cath site one hour prior to needle stick.  30 g  3  . morphine (ROXANOL) 20 MG/ML concentrated solution Take 1 mL (20 mg total) by mouth every 2 (two) hours as needed for severe pain.  120 mL  0  . ondansetron (ZOFRAN) 8 MG tablet Take 1 tablet (8 mg total)  by mouth every 8 (eight) hours as needed for nausea or vomiting. Start on the third day after chemotherapy.  30 tablet  1  . polyethylene glycol (MIRALAX / GLYCOLAX) packet Take 17 g by mouth daily as needed (for constipation).      . prochlorperazine (COMPAZINE) 10 MG tablet Take 1 tablet (10 mg total) by mouth every 6 (six) hours as needed (Nausea or vomiting).  30 tablet  1  . sodium fluoride (FLUORISHIELD) 1.1 % GEL dental gel Instill one drop of fluoride per tooth space of fluoride tray. Place over teeth for 5 minutes. Remove. Spit out excess. Repeat nightly.  120 mL  prn  . fentaNYL (DURAGESIC - DOSED MCG/HR) 50 MCG/HR Place 1 patch (50 mcg total) onto the skin every 3 (three) days.  5 patch  0   Current Facility-Administered Medications  Medication Dose Route Frequency Provider Last Rate Last Dose  . topical emolient (BIAFINE) emulsion   Topical BID Eppie Gibson, MD        Physical Findings: The patient is in no acute distress. Patient is alert and oriented.  height is 5\' 8"  (1.727 m) and weight is 164 lb 11.2 oz (74.707 kg). His temperature is 98 F (36.7 C). His blood pressure is 104/72 and his pulse is 130. Marland Kitchen  Vitals with Age-Percentiles 07/17/2014 07/17/2014  Length  172.7  cm  Systolic 967 591  Diastolic 72 69  Pulse 638 110  Respiration    Weight  74.707 kg  BMI  25.1  VISIT REPORT     Oropharyngeal mucosa has confluent mucositis. No thrush. No obvious palpable cervical or supraclavicular lymphadenopathy - slight thickening in right neck. Skin healing over neck.    Lab Findings: Lab Results  Component Value Date   WBC 5.9 07/01/2014   HGB 11.4* 07/01/2014   HCT 34.3* 07/01/2014   MCV 94.3 07/01/2014   PLT 364 07/01/2014   CMP     Component Value Date/Time   NA 138 07/10/2014 1044   NA 129* 06/25/2014 0125   K 4.1 07/10/2014 1044   K 4.4 06/25/2014 0125   CL 93* 06/25/2014 0125   CO2 31* 07/10/2014 1044   CO2 26 06/25/2014 0125   GLUCOSE 168* 07/10/2014 1044   GLUCOSE 128*  06/25/2014 0125   BUN 10.6 07/10/2014 1044   BUN 13 06/25/2014 0125   CREATININE 0.8 07/10/2014 1044   CREATININE 0.95 06/25/2014 0125   CALCIUM 9.1 07/10/2014 1044   CALCIUM 8.6 06/25/2014 0125   PROT 6.6 07/10/2014 1044   PROT 6.4 06/25/2014 0125   ALBUMIN 2.6* 07/10/2014 1044   ALBUMIN 2.9* 06/25/2014 0125   AST 46* 07/10/2014 1044   AST 739* 06/25/2014 0125   ALT 86* 07/10/2014 1044   ALT 1023* 06/25/2014 0125   ALKPHOS 49 07/10/2014 1044   ALKPHOS 62 06/25/2014 0125   BILITOT 1.12 07/10/2014 1044   BILITOT 1.3* 06/25/2014 0125   GFRNONAA >90 06/25/2014 0125   GFRAA >90 06/25/2014 0125      No results found for this basename: TSH    Radiographic Findings: Dg Chest 2 View  06/25/2014   CLINICAL DATA:  Fever. On chemotherapy and radiation therapy for tonsillar cancer.  EXAM: CHEST  2 VIEW  COMPARISON:  Chest radiograph performed 05/10/2014  FINDINGS: The lungs are well-aerated and clear. There is no evidence of focal opacification, pleural effusion or pneumothorax.  The heart is normal in size; the mediastinal contour is within normal limits. A left-sided chest port is noted ending about the distal SVC. No acute osseous abnormalities are seen.  IMPRESSION: No acute cardiopulmonary process seen.   Electronically Signed   By: Garald Balding M.D.   On: 06/25/2014 01:34    Impression/Plan:    1) Head and Neck Cancer Status: healing from ChRT  2) Nutritional Status: needs improvement. Discussed increased in high protein foods and shakes, and hydration - weight: falling; notify Barb Neff - PEG tube:supplementing with this  3) Risk Factors: The patient has been educated about risk factors including alcohol and tobacco abuse; they understand that avoidance of alcohol and tobacco is important to prevent recurrences as well as other cancers  4) Swallowing: functional  5) Dental: Encouraged to continue regular followup with dentistry, and dental hygiene including fluoride rinses.   6) Energy: check  TSH at time of PET in 3.5 months  7) Social: No active social issues to address at this time  8) Other:discussed vitals and history w/ Dr Alvy Bimler who will order IV fluids today  9) Follow-up in 3.5 months with PET, TSH. The patient was encouraged to call with any issues or questions before then.    _____________________________________   Eppie Gibson, MD

## 2014-07-17 ENCOUNTER — Telehealth: Payer: Self-pay | Admitting: *Deleted

## 2014-07-17 ENCOUNTER — Ambulatory Visit
Admission: RE | Admit: 2014-07-17 | Discharge: 2014-07-17 | Disposition: A | Discharge status: Home / Self Care | Payer: BC Managed Care – PPO | Source: Ambulatory Visit | Attending: Radiation Oncology | Admitting: Radiation Oncology

## 2014-07-17 ENCOUNTER — Telehealth: Payer: Self-pay | Admitting: Hematology and Oncology

## 2014-07-17 ENCOUNTER — Ambulatory Visit (HOSPITAL_BASED_OUTPATIENT_CLINIC_OR_DEPARTMENT_OTHER): Payer: BC Managed Care – PPO

## 2014-07-17 ENCOUNTER — Encounter: Payer: Self-pay | Admitting: Radiation Oncology

## 2014-07-17 ENCOUNTER — Other Ambulatory Visit: Payer: Self-pay

## 2014-07-17 VITALS — BP 123/74 | HR 83 | Temp 98.9°F

## 2014-07-17 VITALS — BP 104/72 | HR 130 | Temp 98.0°F | Ht 68.0 in | Wt 164.7 lb

## 2014-07-17 DIAGNOSIS — C099 Malignant neoplasm of tonsil, unspecified: Secondary | ICD-10-CM

## 2014-07-17 DIAGNOSIS — E860 Dehydration: Secondary | ICD-10-CM

## 2014-07-17 HISTORY — DX: Personal history of irradiation: Z92.3

## 2014-07-17 HISTORY — DX: Personal history of antineoplastic chemotherapy: Z92.21

## 2014-07-17 MED ORDER — SODIUM CHLORIDE 0.9 % IJ SOLN
10.0000 mL | INTRAMUSCULAR | Status: DC | PRN
  Administered 2014-07-17: 10 mL
  Filled 2014-07-17: qty 10

## 2014-07-17 MED ORDER — HEPARIN SOD (PORK) LOCK FLUSH 100 UNIT/ML IV SOLN
500.0000 [IU] | Freq: Once | INTRAVENOUS | Status: AC | PRN
  Administered 2014-07-17: 500 [IU]
  Filled 2014-07-17: qty 5

## 2014-07-17 MED ORDER — LIDOCAINE-PRILOCAINE 2.5-2.5 % EX CREA
TOPICAL_CREAM | CUTANEOUS | Status: AC
  Filled 2014-07-17: qty 5

## 2014-07-17 MED ORDER — BIAFINE EX EMUL: Freq: Two times a day (BID) | CUTANEOUS | Status: DC

## 2014-07-17 MED ORDER — SODIUM CHLORIDE 0.9 % IV SOLN
Freq: Once | INTRAVENOUS | Status: AC
  Administered 2014-07-17: 13:00:00 via INTRAVENOUS

## 2014-07-17 NOTE — Telephone Encounter (Signed)
Per staff phone call and POF I have schedueld appts. Scheduler advised of appts.  JMW  

## 2014-07-17 NOTE — Patient Instructions (Signed)
Dehydration, Adult Dehydration is when you lose more fluids from the body than you take in. Vital organs like the kidneys, brain, and heart cannot function without a proper amount of fluids and salt. Any loss of fluids from the body can cause dehydration.  CAUSES   Vomiting.  Diarrhea.  Excessive sweating.  Excessive urine output.  Fever. SYMPTOMS  Mild dehydration  Thirst.  Dry lips.  Slightly dry mouth. Moderate dehydration  Very dry mouth.  Sunken eyes.  Skin does not bounce back quickly when lightly pinched and released.  Dark urine and decreased urine production.  Decreased tear production.  Headache. Severe dehydration  Very dry mouth.  Extreme thirst.  Rapid, weak pulse (more than 100 beats per minute at rest).  Cold hands and feet.  Not able to sweat in spite of heat and temperature.  Rapid breathing.  Blue lips.  Confusion and lethargy.  Difficulty being awakened.  Minimal urine production.  No tears. DIAGNOSIS  Your caregiver will diagnose dehydration based on your symptoms and your exam. Blood and urine tests will help confirm the diagnosis. The diagnostic evaluation should also identify the cause of dehydration. TREATMENT  Treatment of mild or moderate dehydration can often be done at home by increasing the amount of fluids that you drink. It is best to drink small amounts of fluid more often. Drinking too much at one time can make vomiting worse. Refer to the home care instructions below. Severe dehydration needs to be treated at the hospital where you will probably be given intravenous (IV) fluids that contain water and electrolytes. HOME CARE INSTRUCTIONS   Ask your caregiver about specific rehydration instructions.  Drink enough fluids to keep your urine clear or pale yellow.  Drink small amounts frequently if you have nausea and vomiting.  Eat as you normally do.  Avoid:  Foods or drinks high in sugar.  Carbonated  drinks.  Juice.  Extremely hot or cold fluids.  Drinks with caffeine.  Fatty, greasy foods.  Alcohol.  Tobacco.  Overeating.  Gelatin desserts.  Wash your hands well to avoid spreading bacteria and viruses.  Only take over-the-counter or prescription medicines for pain, discomfort, or fever as directed by your caregiver.  Ask your caregiver if you should continue all prescribed and over-the-counter medicines.  Keep all follow-up appointments with your caregiver. SEEK MEDICAL CARE IF:  You have abdominal pain and it increases or stays in one area (localizes).  You have a rash, stiff neck, or severe headache.  You are irritable, sleepy, or difficult to awaken.  You are weak, dizzy, or extremely thirsty. SEEK IMMEDIATE MEDICAL CARE IF:   You are unable to keep fluids down or you get worse despite treatment.  You have frequent episodes of vomiting or diarrhea.  You have blood or green matter (bile) in your vomit.  You have blood in your stool or your stool looks black and tarry.  You have not urinated in 6 to 8 hours, or you have only urinated a small amount of very dark urine.  You have a fever.  You faint. MAKE SURE YOU:   Understand these instructions.  Will watch your condition.  Will get help right away if you are not doing well or get worse. Document Released: 11/01/2005 Document Revised: 01/24/2012 Document Reviewed: 06/21/2011 ExitCare Patient Information 2015 [employer]. This information is not intended to replace advice given to you by your health care provider. Make sure you discuss any questions you have with your health care   provider.  

## 2014-07-17 NOTE — Telephone Encounter (Signed)
, °

## 2014-07-17 NOTE — Progress Notes (Addendum)
Christopher Mullins here for reassessment status post radiation therapy to his right tonsil and neck region whichcompleted on 07/04/14.  Note hyperpigmentation and and redness of skin on neck. Using Bafine lotion.  Note small white areas on tongue and he is currently taking Diflucan for thrush.  Mucositis noted in throat and also note thickened saliva.  He is currently drinking Carnation instant breakfast - 4 cans daily with Osmolite twce daily via PEG starting this week, vs. 5 cans of Osmolite as suggested per dietician.  He reports pain upon swallowing as a level 6/10.  No longer using his his Fentanyl patch,only using Morphine. Orthostatic pressures

## 2014-07-18 ENCOUNTER — Other Ambulatory Visit: Payer: Self-pay | Admitting: *Deleted

## 2014-07-18 ENCOUNTER — Ambulatory Visit (HOSPITAL_BASED_OUTPATIENT_CLINIC_OR_DEPARTMENT_OTHER): Payer: BC Managed Care – PPO

## 2014-07-18 ENCOUNTER — Telehealth: Payer: Self-pay | Admitting: *Deleted

## 2014-07-18 ENCOUNTER — Ambulatory Visit: Payer: BC Managed Care – PPO | Admitting: Nutrition

## 2014-07-18 ENCOUNTER — Other Ambulatory Visit: Payer: Self-pay | Admitting: Hematology and Oncology

## 2014-07-18 VITALS — BP 124/70 | HR 91 | Temp 98.5°F

## 2014-07-18 DIAGNOSIS — E860 Dehydration: Secondary | ICD-10-CM

## 2014-07-18 DIAGNOSIS — C099 Malignant neoplasm of tonsil, unspecified: Secondary | ICD-10-CM

## 2014-07-18 DIAGNOSIS — R070 Pain in throat: Secondary | ICD-10-CM

## 2014-07-18 MED ORDER — MORPHINE SULFATE (CONCENTRATE) 20 MG/ML PO SOLN: 20.0000 mg | ORAL | Status: DC | PRN

## 2014-07-18 MED ORDER — SODIUM CHLORIDE 0.9 % IV SOLN
Freq: Once | INTRAVENOUS | Status: AC
  Administered 2014-07-18: 08:00:00 via INTRAVENOUS

## 2014-07-18 MED ORDER — HEPARIN SOD (PORK) LOCK FLUSH 100 UNIT/ML IV SOLN
500.0000 [IU] | Freq: Once | INTRAVENOUS | Status: AC | PRN
  Administered 2014-07-18: 500 [IU]
  Filled 2014-07-18: qty 5

## 2014-07-18 MED ORDER — SODIUM CHLORIDE 0.9 % IJ SOLN
10.0000 mL | INTRAMUSCULAR | Status: DC | PRN
  Administered 2014-07-18: 10 mL
  Filled 2014-07-18: qty 10

## 2014-07-18 NOTE — Telephone Encounter (Signed)
Called patient to inform of Pet, Lab and Fu for 11-01-14, spoke with patient's wife - Anne Ng, and she is aware of these appts.

## 2014-07-18 NOTE — Progress Notes (Signed)
Nutrition followup completed with patient.  Patient states he has been eating a minimum of 2500 calories.  He is using 3 cans of Jevity 1.2 via feeding tube and has increased his oral intake using milkshakes and scrambled eggs.  Weight documented as 164.7 pounds September 2, down from 171.3 pounds August 26, and 178 pounds August 18.  Patient appears hypermetabolic requiring additional calories and protein after treatment to promote healing and weight maintenance.  Nutrition diagnosis: Food and nutrition related knowledge deficit continues.  Intervention: Educated patient to increase oral intake and calories to foods he is consuming by mouth. Provided patient with [facility] samples which contain 530 calories and 22 g protein in 8 ounces.  Patient agreeable to drinking or using them in milkshakes. Patient to continue 3 cans minimum of Jevity 1.2 via feeding tube. Patient also educated to try one [facility] via feeding tube if unable to tolerate by mouth. Questions answered.  Teach back method used.  Monitoring, evaluation, goals: Patient has had weight loss on tube feedings and oral intake.    Next visit: Contact patient by phone next week for followup.

## 2014-07-18 NOTE — Patient Instructions (Signed)

## 2014-07-19 ENCOUNTER — Ambulatory Visit: Payer: Self-pay

## 2014-07-23 ENCOUNTER — Other Ambulatory Visit: Payer: Self-pay | Admitting: Hematology and Oncology

## 2014-07-23 ENCOUNTER — Other Ambulatory Visit (HOSPITAL_BASED_OUTPATIENT_CLINIC_OR_DEPARTMENT_OTHER): Payer: BC Managed Care – PPO

## 2014-07-23 ENCOUNTER — Encounter: Payer: Self-pay | Admitting: Hematology and Oncology

## 2014-07-23 ENCOUNTER — Ambulatory Visit (HOSPITAL_BASED_OUTPATIENT_CLINIC_OR_DEPARTMENT_OTHER): Payer: BC Managed Care – PPO | Admitting: Hematology and Oncology

## 2014-07-23 ENCOUNTER — Telehealth: Payer: Self-pay | Admitting: Hematology and Oncology

## 2014-07-23 ENCOUNTER — Encounter: Payer: Self-pay | Admitting: *Deleted

## 2014-07-23 VITALS — BP 116/73 | HR 115 | Temp 97.9°F | Resp 18 | Ht 68.0 in | Wt 164.9 lb

## 2014-07-23 DIAGNOSIS — C099 Malignant neoplasm of tonsil, unspecified: Secondary | ICD-10-CM

## 2014-07-23 DIAGNOSIS — B182 Chronic viral hepatitis C: Secondary | ICD-10-CM

## 2014-07-23 DIAGNOSIS — D630 Anemia in neoplastic disease: Secondary | ICD-10-CM

## 2014-07-23 DIAGNOSIS — Z931 Gastrostomy status: Secondary | ICD-10-CM

## 2014-07-23 DIAGNOSIS — R7401 Elevation of levels of liver transaminase levels: Secondary | ICD-10-CM

## 2014-07-23 DIAGNOSIS — R740 Nonspecific elevation of levels of transaminase and lactic acid dehydrogenase [LDH]: Secondary | ICD-10-CM

## 2014-07-23 DIAGNOSIS — R7402 Elevation of levels of lactic acid dehydrogenase (LDH): Secondary | ICD-10-CM

## 2014-07-23 DIAGNOSIS — R634 Abnormal weight loss: Secondary | ICD-10-CM

## 2014-07-23 LAB — COMPREHENSIVE METABOLIC PANEL (CC13)
ALBUMIN: 2.9 g/dL — AB (ref 3.5–5.0)
ANION GAP: 8 meq/L (ref 3–11)
Alkaline Phosphatase: 58 U/L (ref 40–150)
BUN: 16.2 mg/dL (ref 7.0–26.0)
CO2: 27 mEq/L (ref 22–29)
CREATININE: 0.9 mg/dL (ref 0.7–1.3)
Calcium: 9.2 mg/dL (ref 8.4–10.4)
Chloride: 101 mEq/L (ref 98–109)
Glucose: 137 mg/dl (ref 70–140)
POTASSIUM: 4 meq/L (ref 3.5–5.1)
Sodium: 136 mEq/L (ref 136–145)
Total Bilirubin: 0.91 mg/dL (ref 0.20–1.20)
Total Protein: 7.5 g/dL (ref 6.4–8.3)

## 2014-07-23 LAB — CBC WITH DIFFERENTIAL/PLATELET
BASO%: 0.6 % (ref 0.0–2.0)
BASOS ABS: 0.1 10*3/uL (ref 0.0–0.1)
EOS ABS: 0.2 10*3/uL (ref 0.0–0.5)
EOS%: 2.1 % (ref 0.0–7.0)
HCT: 39.2 % (ref 38.4–49.9)
HEMOGLOBIN: 13.2 g/dL (ref 13.0–17.1)
LYMPH%: 19.3 % (ref 14.0–49.0)
MCH: 32.2 pg (ref 27.2–33.4)
MCHC: 33.7 g/dL (ref 32.0–36.0)
MCV: 95.6 fL (ref 79.3–98.0)
MONO#: 1 10*3/uL — ABNORMAL HIGH (ref 0.1–0.9)
MONO%: 13.3 % (ref 0.0–14.0)
NEUT%: 64.7 % (ref 39.0–75.0)
NEUTROS ABS: 5 10*3/uL (ref 1.5–6.5)
Platelets: 232 10*3/uL (ref 140–400)
RBC: 4.1 10*6/uL — ABNORMAL LOW (ref 4.20–5.82)
RDW: 16.5 % — AB (ref 11.0–14.6)
WBC: 7.7 10*3/uL (ref 4.0–10.3)
lymph#: 1.5 10*3/uL (ref 0.9–3.3)

## 2014-07-23 NOTE — Assessment & Plan Note (Signed)
I recommend a trial of oral food intake.

## 2014-07-23 NOTE — Assessment & Plan Note (Signed)
Unfortunately, the patient had significant flare of hepatitis with his second dose of cisplatin. The patient had completed all his treatment In the meantime, I will continue aggressive supportive therapy.

## 2014-07-23 NOTE — Telephone Encounter (Signed)
Pt confirmed labs/ov per 09/08 POF, gave pt AVS...KJ °

## 2014-07-23 NOTE — Assessment & Plan Note (Signed)
His feeding tube looks okay with no signs of infection.

## 2014-07-23 NOTE — Progress Notes (Signed)
To provide support and encouragement, care continuity and to assess for needs, met with patient and his wife during follow-up appt with Dr. Alvy Bimler.  He reported: 1)  Nutrition -  "Bad" appetite, i.e food not appealing.  He stated he ate eggs over the weekend and experienced N&V which had not previously been the case.  I encouraged him to try at least 1-2 new foods each day to identify something palatable.  I explained that foods that are tolerable one day may not be the next day and that this is a normal post-tmt experience.  I stressed the importance of trying new foods.  He stated he is consuming ca 2000 calories/day via nutritional supplements via PEG in addition to ca 64 oz fluids "most days".  2)  Pain - Rates pain 5-6, only when swallows.  Using Roxanol on occasion. No longer using Fentanyl. 3)  Bowels - Occasional constipation relieved by Miralax. I encouraged him to contact me should needs/concerns arise.  Gayleen Orem, RN, BSN, [place] at [place] [phone]

## 2014-07-23 NOTE — Progress Notes (Signed)
[place] OFFICE PROGRESS NOTE  Patient Care Team: Gavin Pound, MD as PCP - General (Family Medicine) Brooks Sailors, RN as Oncology Nurse Navigator (Oncology)  SUMMARY OF ONCOLOGIC HISTORY: Oncology History   Tonsil cancer, Right, HPV positive,    Primary site: Pharynx - Oropharynx   Staging method: AJCC 7th Edition   Clinical: Stage IVA (T1, N2, M0) signed by Heath Lark, MD on 04/17/2014  1:04 PM   Summary: Stage IVA (T1, N2, M0)       Tonsil cancer   03/20/2014 Imaging Ct scan of neck showed several complex solid and cystic lesions within the right neck and abnormalities in the pharynx   03/25/2014 Procedure 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 Multiple hypermetabolic right level 2 lymph nodes compatible with metastatic adenopathy   05/10/2014 Surgery The patient has placement of port and feeding tube.   05/15/2014 - 06/11/2014 Chemotherapy He received high dose cisplatin every 3 weeks. He only received 2 doses due to severe liver function test abnormalities.   05/15/2014 - 07/04/2014 Radiation Therapy He received radiation treatment   06/05/2014 Adverse Reaction Cycle 2 of chemotherapy is delayed due to neutropenia & cycle 3 was abondoned due to severe hepatitis    INTERVAL HISTORY: Please see below for problem oriented charting. He has not lost weight since I saw him. His recent his infection has resolved.  REVIEW OF SYSTEMS:   Constitutional: Denies fevers, chills or abnormal weight loss Eyes: Denies blurriness of vision Ears, nose, mouth, throat, and face: Denies mucositis or sore throat. He is off all pain medicine. Respiratory: Denies cough, dyspnea or wheezes Cardiovascular: Denies palpitation, chest discomfort or lower extremity swelling Gastrointestinal:   Denies nausea, heartburn or change in bowel habits Skin: Denies abnormal skin rashes Lymphatics: Denies new lymphadenopathy or easy bruising Neurological:Denies numbness, tingling or new weaknesses Behavioral/Psych: Mood is stable, no new changes  All other systems were reviewed with the patient and are negative.  I have reviewed the past medical history, past surgical history, social history and family history with the patient and they are unchanged from previous note.  ALLERGIES:  is allergic to sulfa antibiotics.  MEDICATIONS:  Current Outpatient Prescriptions  Medication Sig Dispense Refill  . Alum & Mag Hydroxide-Simeth (MAGIC MOUTHWASH W/LIDOCAINE) SOLN Take 10 mLs by mouth 4 (four) times daily as needed for mouth pain.      Marland Kitchen emollient (BIAFINE) cream Apply 1 application topically 3 (three) times daily.      Marland Kitchen lidocaine-prilocaine (EMLA) cream Apply 1 application topically as needed. Apply to [facility] a Cath site one hour prior to needle stick.  30 g  3  . morphine (ROXANOL) 20 MG/ML concentrated solution Take 1 mL (20 mg total) by mouth every 2 (two) hours as needed for severe pain.  120 mL  0  . ondansetron (ZOFRAN) 8 MG tablet Take 1 tablet (8 mg total) by mouth every 8 (eight) hours as needed for nausea or vomiting. Start on the third day after chemotherapy.  30 tablet  1  . polyethylene glycol (MIRALAX / GLYCOLAX) packet Take 17 g by mouth daily as needed (for constipation).      . prochlorperazine (COMPAZINE) 10 MG tablet Take 1 tablet (10 mg total) by mouth every 6 (six) hours as needed (Nausea or vomiting).  30 tablet  1  . sodium fluoride (FLUORISHIELD) 1.1 % GEL dental gel Instill one drop of fluoride per tooth space of fluoride tray. Place over teeth for 5 minutes. Remove. Spit out excess. Repeat nightly.  120 mL  prn   No current facility-administered medications for this visit.    PHYSICAL EXAMINATION: ECOG PERFORMANCE STATUS: 0 - Asymptomatic  Filed Vitals:   07/23/14  1304  BP: 116/73  Pulse: 115  Temp: 97.9 F (36.6 C)  Resp: 18   Filed Weights   07/23/14 1304  Weight: 164 lb 14.4 oz (74.798 kg)    GENERAL:alert, no distress and comfortable SKIN: skin color, texture, turgor are normal, no rashes or significant lesions. Radiation-induced skin injury has resolved. EYES: normal, Conjunctiva are pink and non-injected, sclera clear OROPHARYNX:no exudate, no erythema and lips, buccal mucosa, and tongue normal . No thrush is noted. NECK: supple, thyroid normal size, non-tender, without nodularity LYMPH:  no palpable lymphadenopathy in the cervical, axillary or inguinal LUNGS: clear to auscultation and percussion with normal breathing effort HEART: regular rate & rhythm and no murmurs and no lower extremity edema ABDOMEN:abdomen soft, non-tender and normal bowel sounds. Feeding tube site looks okay Musculoskeletal:no cyanosis of digits and no clubbing  NEURO: alert & oriented x 3 with fluent speech, no focal motor/sensory deficits  LABORATORY DATA:  I have reviewed the data as listed    Component Value Date/Time   NA 136 07/23/2014 1255   NA 129* 06/25/2014 0125   K 4.0 07/23/2014 1255   K 4.4 06/25/2014 0125   CL 93* 06/25/2014 0125   CO2 27 07/23/2014 1255   CO2 26 06/25/2014 0125   GLUCOSE 137 07/23/2014 1255   GLUCOSE 128* 06/25/2014 0125   BUN 16.2 07/23/2014 1255   BUN 13 06/25/2014 0125   CREATININE 0.9 07/23/2014 1255   CREATININE 0.95 06/25/2014 0125   CALCIUM 9.2 07/23/2014 1255   CALCIUM 8.6 06/25/2014 0125   PROT 7.5 07/23/2014 1255   PROT 6.4 06/25/2014 0125   ALBUMIN 2.9* 07/23/2014 1255   ALBUMIN 2.9* 06/25/2014 0125   AST 822 Repeated and Verified* 07/23/2014 1255   AST 739* 06/25/2014 0125   ALT 920 Repeated and Verified* 07/23/2014 1255   ALT 1023* 06/25/2014 0125   ALKPHOS 58 07/23/2014 1255   ALKPHOS 62 06/25/2014 0125   BILITOT 0.91 07/23/2014 1255   BILITOT 1.3* 06/25/2014 0125   GFRNONAA >90 06/25/2014 0125   GFRAA >90 06/25/2014 0125    No  results found for this basename: SPEP, UPEP,  kappa and lambda light chains    Lab Results  Component Value Date   WBC 7.7 07/23/2014   NEUTROABS 5.0 07/23/2014   HGB 13.2 07/23/2014   HCT 39.2 07/23/2014   MCV 95.6 07/23/2014   PLT 232 07/23/2014      Chemistry      Component Value Date/Time   NA 136 07/23/2014 1255   NA 129* 06/25/2014 0125   K 4.0 07/23/2014 1255   K 4.4 06/25/2014 0125   CL 93* 06/25/2014 0125   CO2 27 07/23/2014 1255   CO2 26 06/25/2014 0125   BUN 16.2 07/23/2014 1255   BUN 13 06/25/2014 0125   CREATININE 0.9 07/23/2014 1255   CREATININE 0.95 06/25/2014 0125      Component Value Date/Time   CALCIUM 9.2 07/23/2014 1255   CALCIUM 8.6 06/25/2014 0125   ALKPHOS 58 07/23/2014 1255   ALKPHOS 62 06/25/2014 0125   AST 822 Repeated and Verified* 07/23/2014 1255  AST 739* 06/25/2014 0125   ALT 920 Repeated and Verified* 07/23/2014 1255   ALT 1023* 06/25/2014 0125   BILITOT 0.91 07/23/2014 1255   BILITOT 1.3* 06/25/2014 0125      ASSESSMENT & PLAN:  Tonsil cancer Unfortunately, the patient had significant flare of hepatitis with his second dose of cisplatin. The patient had completed all his treatment In the meantime, I will continue aggressive supportive therapy.      Hepatitis C The liver enzymes are worse, likely related to recent medication. I am going to refer him to infectious disease team for further management   Elevated transaminase level  This is worse, likely due to liver hepatitis C. I recommend infectious disease consultation and he agreed.  S/P percutaneous endoscopic gastrostomy (PEG) tube placement His feeding tube looks okay with no signs of infection.          Anemia in neoplastic disease         Unintentional weight loss I recommend a trial of oral food intake.    All questions were answered. The patient knows to call the clinic with any problems, questions or concerns. No barriers to learning was detected. I spent 25 minutes counseling the  patient face to face. The total time spent in the appointment was 30 minutes and more than 50% was on counseling and review of test results     [facility], [place], MD 07/23/2014 1:58 PM

## 2014-07-23 NOTE — Assessment & Plan Note (Signed)
This is worse, likely due to liver hepatitis C. I recommend infectious disease consultation and he agreed.

## 2014-07-23 NOTE — Assessment & Plan Note (Signed)
The liver enzymes are worse, likely related to recent medication. I am going to refer him to infectious disease team for further management

## 2014-07-26 ENCOUNTER — Telehealth: Payer: Self-pay | Admitting: *Deleted

## 2014-07-26 NOTE — Telephone Encounter (Signed)
Pt left VM yesterday to f/u on referral to ID for his liver.  He also called and left VM today.   Dr. Alvy Bimler did send referral to ID on 9/08.  I called over to [facility] at [facility] regarding the referral.  I spoke w/ staff member who confirmed they did receive the referral but it has not been reviewed by MD yet.  She will request it to be reviewed so she can make appt for pt..  Called pt back and informed him of above.  He verbalized understanding.

## 2014-07-29 ENCOUNTER — Other Ambulatory Visit (HOSPITAL_BASED_OUTPATIENT_CLINIC_OR_DEPARTMENT_OTHER): Payer: BC Managed Care – PPO

## 2014-07-29 ENCOUNTER — Telehealth: Payer: Self-pay | Admitting: *Deleted

## 2014-07-29 DIAGNOSIS — C099 Malignant neoplasm of tonsil, unspecified: Secondary | ICD-10-CM

## 2014-07-29 LAB — CBC WITH DIFFERENTIAL/PLATELET
BASO%: 0.9 % (ref 0.0–2.0)
Basophils Absolute: 0 10*3/uL (ref 0.0–0.1)
EOS ABS: 0.3 10*3/uL (ref 0.0–0.5)
EOS%: 5.7 % (ref 0.0–7.0)
HCT: 40 % (ref 38.4–49.9)
HGB: 13 g/dL (ref 13.0–17.1)
LYMPH%: 42.7 % (ref 14.0–49.0)
MCH: 31.9 pg (ref 27.2–33.4)
MCHC: 32.6 g/dL (ref 32.0–36.0)
MCV: 97.7 fL (ref 79.3–98.0)
MONO#: 0.7 10*3/uL (ref 0.1–0.9)
MONO%: 12.9 % (ref 0.0–14.0)
NEUT#: 2 10*3/uL (ref 1.5–6.5)
NEUT%: 37.8 % — ABNORMAL LOW (ref 39.0–75.0)
Platelets: 230 10*3/uL (ref 140–400)
RBC: 4.09 10*6/uL — AB (ref 4.20–5.82)
RDW: 16.1 % — ABNORMAL HIGH (ref 11.0–14.6)
WBC: 5.3 10*3/uL (ref 4.0–10.3)
lymph#: 2.3 10*3/uL (ref 0.9–3.3)

## 2014-07-29 LAB — COMPREHENSIVE METABOLIC PANEL (CC13)
ALBUMIN: 2.8 g/dL — AB (ref 3.5–5.0)
ALT: 924 U/L (ref 0–55)
ANION GAP: 8 meq/L (ref 3–11)
AST: 607 U/L (ref 5–34)
Alkaline Phosphatase: 63 U/L (ref 40–150)
BUN: 16.9 mg/dL (ref 7.0–26.0)
CALCIUM: 9.4 mg/dL (ref 8.4–10.4)
CO2: 28 mEq/L (ref 22–29)
Chloride: 102 mEq/L (ref 98–109)
Creatinine: 0.9 mg/dL (ref 0.7–1.3)
GLUCOSE: 138 mg/dL (ref 70–140)
POTASSIUM: 4.1 meq/L (ref 3.5–5.1)
Sodium: 139 mEq/L (ref 136–145)
Total Bilirubin: 0.78 mg/dL (ref 0.20–1.20)
Total Protein: 7.2 g/dL (ref 6.4–8.3)

## 2014-07-29 NOTE — Telephone Encounter (Signed)
Message copied by Cathlean Cower on Mon Jul 29, 2014 11:37 AM ------      Message from: [facility], [place]: Mon Jul 29, 2014 11:20 AM      Regarding: LFT       Please let him know LFT is still very high. What is the status of ID consult?      If he is not drinking enough, recommend a trial of IVF      ----- Message -----         From: [facility]         Sent: 07/29/2014  10:33 AM           To: Heath Lark, MD                   ------

## 2014-07-29 NOTE — Telephone Encounter (Signed)
Informed Mr. Pomplun of LFT results and Dr. Calton Dach recommendation for IVFs this week.   Pt states he has appt w/ ID on 9/23 and sees [place] again w/ lab on 9/21.  He feels he can drink enough fluids at home and does not need to come in for IVFs again.  He will try his best to drink 2 liters of fluid per day.

## 2014-08-05 ENCOUNTER — Telehealth: Payer: Self-pay | Admitting: *Deleted

## 2014-08-05 ENCOUNTER — Ambulatory Visit (HOSPITAL_BASED_OUTPATIENT_CLINIC_OR_DEPARTMENT_OTHER): Payer: BC Managed Care – PPO | Admitting: Hematology and Oncology

## 2014-08-05 ENCOUNTER — Encounter: Payer: Self-pay | Admitting: Hematology and Oncology

## 2014-08-05 ENCOUNTER — Ambulatory Visit: Payer: BC Managed Care – PPO | Admitting: Nutrition

## 2014-08-05 ENCOUNTER — Ambulatory Visit (HOSPITAL_COMMUNITY): Payer: Medicaid - Dental | Admitting: Dentistry

## 2014-08-05 ENCOUNTER — Other Ambulatory Visit (HOSPITAL_BASED_OUTPATIENT_CLINIC_OR_DEPARTMENT_OTHER): Payer: BC Managed Care – PPO

## 2014-08-05 ENCOUNTER — Telehealth: Payer: Self-pay | Admitting: Hematology and Oncology

## 2014-08-05 ENCOUNTER — Encounter (HOSPITAL_COMMUNITY): Payer: Self-pay | Admitting: Dentistry

## 2014-08-05 VITALS — BP 120/66 | HR 99 | Temp 98.5°F | Resp 18 | Ht 68.0 in | Wt 167.1 lb

## 2014-08-05 VITALS — BP 110/68 | HR 84 | Temp 97.8°F

## 2014-08-05 DIAGNOSIS — K036 Deposits [accretions] on teeth: Secondary | ICD-10-CM

## 2014-08-05 DIAGNOSIS — R1310 Dysphagia, unspecified: Secondary | ICD-10-CM

## 2014-08-05 DIAGNOSIS — R432 Parageusia: Secondary | ICD-10-CM

## 2014-08-05 DIAGNOSIS — Z923 Personal history of irradiation: Secondary | ICD-10-CM

## 2014-08-05 DIAGNOSIS — K117 Disturbances of salivary secretion: Secondary | ICD-10-CM

## 2014-08-05 DIAGNOSIS — R682 Dry mouth, unspecified: Secondary | ICD-10-CM

## 2014-08-05 DIAGNOSIS — Z931 Gastrostomy status: Secondary | ICD-10-CM

## 2014-08-05 DIAGNOSIS — R634 Abnormal weight loss: Secondary | ICD-10-CM

## 2014-08-05 DIAGNOSIS — K1233 Oral mucositis (ulcerative) due to radiation: Secondary | ICD-10-CM

## 2014-08-05 DIAGNOSIS — C099 Malignant neoplasm of tonsil, unspecified: Secondary | ICD-10-CM

## 2014-08-05 DIAGNOSIS — R740 Nonspecific elevation of levels of transaminase and lactic acid dehydrogenase [LDH]: Secondary | ICD-10-CM

## 2014-08-05 DIAGNOSIS — Z0189 Encounter for other specified special examinations: Secondary | ICD-10-CM

## 2014-08-05 DIAGNOSIS — R7401 Elevation of levels of liver transaminase levels: Secondary | ICD-10-CM

## 2014-08-05 DIAGNOSIS — Z9221 Personal history of antineoplastic chemotherapy: Secondary | ICD-10-CM

## 2014-08-05 DIAGNOSIS — B182 Chronic viral hepatitis C: Secondary | ICD-10-CM

## 2014-08-05 DIAGNOSIS — K1231 Oral mucositis (ulcerative) due to antineoplastic therapy: Secondary | ICD-10-CM

## 2014-08-05 LAB — COMPREHENSIVE METABOLIC PANEL (CC13)
ALBUMIN: 2.9 g/dL — AB (ref 3.5–5.0)
ALK PHOS: 56 U/L (ref 40–150)
ALT: 221 U/L — AB (ref 0–55)
AST: 104 U/L — ABNORMAL HIGH (ref 5–34)
Anion Gap: 9 mEq/L (ref 3–11)
BUN: 18.4 mg/dL (ref 7.0–26.0)
CO2: 28 mEq/L (ref 22–29)
Calcium: 9.3 mg/dL (ref 8.4–10.4)
Chloride: 105 mEq/L (ref 98–109)
Creatinine: 0.8 mg/dL (ref 0.7–1.3)
GLUCOSE: 127 mg/dL (ref 70–140)
POTASSIUM: 4 meq/L (ref 3.5–5.1)
Sodium: 141 mEq/L (ref 136–145)
Total Bilirubin: 0.46 mg/dL (ref 0.20–1.20)
Total Protein: 7.4 g/dL (ref 6.4–8.3)

## 2014-08-05 NOTE — Assessment & Plan Note (Signed)
Infectious disease consultation is pending.

## 2014-08-05 NOTE — Telephone Encounter (Signed)
Message copied by Patton Salles on Mon Aug 05, 2014 12:23 PM ------      Message from: [facility], [place]: Mon Aug 05, 2014 12:11 PM      Regarding: LFt       pls let patient know labs are better      ----- Message -----         From: [facility]         Sent: 08/05/2014  11:52 AM           To: Heath Lark, MD                   ------

## 2014-08-05 NOTE — Telephone Encounter (Signed)
Notified of results

## 2014-08-05 NOTE — Telephone Encounter (Signed)
s.w. pt and advised on OCT appt....pt ok and aware °

## 2014-08-05 NOTE — Assessment & Plan Note (Signed)
This is improving. He will be visiting with nutritionist today. Hopefully we can remove the feeding tube in the future.

## 2014-08-05 NOTE — Assessment & Plan Note (Signed)
His feeding tube looks okay with no signs of infection.

## 2014-08-05 NOTE — Progress Notes (Signed)
Nutrition followup completed with patient and wife.  Patient continues to consume a minimum of 2500 calories.  He has decreased the amount of nutrition going through his feeding tube and is using impact 1.5 one bottle daily through feeding tube.  He consumes 2 boost plus daily and four Carnation essential breakfast daily, mostly by mouth.  He has begun to introduce some soft food intake.  His main complaint is dry mouth.  He denies mouth sores or sore throat.  Taste alterations continue.  Weight documented as 167.1 pounds September 21.  Increased from 164.7 pounds September 2.  Nutrition diagnosis: Food and nutrition related knowledge deficit improved.  Intervention: Educated patient to continue to increase liquids and soft textures by mouth.  At least 3 times a day with meals.  Provided list of foods to try. Recommended patient strive for goal weight of 170-175 pounds. Patient to continue using feeding tube until patient able to maintain weight on oral intake alone for minimum of 2 weeks. Questions answered.  Teach back method used.  Monitoring, evaluation, goals: Patient will tolerate increased oral intake and decreased feeding tube usage to meet minimum weight of 170 pounds.  Next visit: Approximately one month.    **Disclaimer: This note was dictated with voice recognition software. Similar sounding words can inadvertently be transcribed and this note may contain transcription errors which may not have been corrected upon publication of note.**

## 2014-08-05 NOTE — Assessment & Plan Note (Addendum)
Unfortunately, the patient had significant flare of hepatitis with his second dose of cisplatin. The patient had completed all his treatment In the meantime, I will continue aggressive supportive therapy. He is better since the last time I saw him.

## 2014-08-05 NOTE — Assessment & Plan Note (Signed)
This is improving, likely due to liver hepatitis C. I recommend infectious disease consultation and he agreed.

## 2014-08-05 NOTE — Progress Notes (Signed)
08/05/2014   Patient:            Christopher Mullins Date of Birth:  06-Oct-1956 MRN:                [id number]  BP 110/68  Pulse 84  Temp(Src) 97.8 F (36.6 C)  Christopher Mullins presents for periodic oral examination after radiation therapy. Patient has completed all chemoradiation therapy. The patient received radiation therapy from 05/15/2014 through 07/04/2014. Patient had 2 cycles of chemotherapy.  REVIEW OF CHIEF COMPLAINTS:  DRY MOUTH: Yes. HARD TO SWALLOW: Yes, at times.  HURT TO SWALLOW: Yes. TASTE CHANGES: Taste is returning slowly. SORES IN MOUTH: Yes. TRISMUS: No problems with trismus. WEIGHT: 165 lbs. HOME OH REGIMEN:  BRUSHING: 2-3 times a day. FLOSSING: 2 times a week RINSING: Using salt water rinses. FLUORIDE: Using fluoride trays 5 times a week. TRISMUS EXERCISES:  Maximum interincisal opening: 45 mm   DENTAL EXAM:  Oral Hygiene:(PLAQUE): Plaque noted. Oral hygiene improvement was highly suggested. LOCATION OF MUCOSITIS: Generalized erythema to oral tissues. DESCRIPTION OF SALIVA: Decreased-moderate xerostomia ANY EXPOSED BONE: None noted OTHER WATCHED AREAS: Previous extraction sites 2 and 31. DX: Xerostomia, Dysgeusia, Dysphagia, Accretions and Mucositis  RECOMMENDATIONS: 1. Brush after meals and at bedtime.  Use fluoride at bedtime. 2. Use trismus exercises as directed. 3. Use Biotene Rinse or salt water/baking soda rinses. 4. Multiple sips of water as needed. 5. Schedule visit with Dr. Nicki Reaper Minor for insertion of crown and periodontal therapy in one to 2 months.    Lenn Cal, DDS

## 2014-08-05 NOTE — Progress Notes (Signed)
[place] OFFICE PROGRESS NOTE  Patient Care Team: Gavin Pound, MD as PCP - General (Family Medicine) Brooks Sailors, RN as Oncology Nurse Navigator (Oncology)  SUMMARY OF ONCOLOGIC HISTORY: Oncology History   Tonsil cancer, Right, HPV positive,    Primary site: Pharynx - Oropharynx   Staging method: AJCC 7th Edition   Clinical: Stage IVA (T1, N2, M0) signed by Heath Lark, MD on 04/17/2014  1:04 PM   Summary: Stage IVA (T1, N2, M0)       Tonsil cancer   03/20/2014 Imaging Ct scan of neck showed several complex solid and cystic lesions within the right neck and abnormalities in the pharynx   03/25/2014 Procedure 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 Multiple hypermetabolic right level 2 lymph nodes compatible with metastatic adenopathy   05/10/2014 Surgery The patient has placement of port and feeding tube.   05/15/2014 - 06/11/2014 Chemotherapy He received high dose cisplatin every 3 weeks. He only received 2 doses due to severe liver function test abnormalities.   05/15/2014 - 07/04/2014 Radiation Therapy He received radiation treatment   06/05/2014 Adverse Reaction Cycle 2 of chemotherapy is delayed due to neutropenia & cycle 3 was abondoned due to severe hepatitis    INTERVAL HISTORY: Please see below for problem oriented charting. Overall, he is improved compared to last week. He is to have anorexia but is able to tolerate oral intake. No nausea. His throat pain is stable and he is weaning himself off pain medicine.  REVIEW OF SYSTEMS:   Constitutional: Denies fevers, chills or abnormal weight loss Eyes: Denies blurriness of vision Respiratory: Denies cough, dyspnea or wheezes Cardiovascular: Denies palpitation, chest discomfort or lower extremity  swelling Gastrointestinal:  Denies nausea, heartburn or change in bowel habits Skin: Denies abnormal skin rashes Lymphatics: Denies new lymphadenopathy or easy bruising Neurological:Denies numbness, tingling or new weaknesses Behavioral/Psych: Mood is stable, no new changes  All other systems were reviewed with the patient and are negative.  I have reviewed the past medical history, past surgical history, social history and family history with the patient and they are unchanged from previous note.  ALLERGIES:  is allergic to sulfa antibiotics.  MEDICATIONS:  Current Outpatient Prescriptions  Medication Sig Dispense Refill  . Alum & Mag Hydroxide-Simeth (MAGIC MOUTHWASH W/LIDOCAINE) SOLN Take 10 mLs by mouth 4 (four) times daily as needed for mouth pain.      Marland Kitchen emollient (BIAFINE) cream Apply 1 application topically 3 (three) times daily.      Marland Kitchen lidocaine-prilocaine (EMLA) cream Apply 1 application topically as needed. Apply to [facility] a Cath site one hour prior to needle stick.  30 g  3  . morphine (ROXANOL) 20 MG/ML concentrated solution Take 1 mL (20 mg total) by mouth every 2 (two) hours as needed for severe pain.  120 mL  0  . ondansetron (ZOFRAN) 8 MG tablet Take 1 tablet (8 mg total) by mouth every 8 (eight) hours as needed for nausea or vomiting. Start on the third day after chemotherapy.  30 tablet  1  . polyethylene glycol (MIRALAX / GLYCOLAX) packet Take 17 g by mouth daily as needed (for constipation).      . prochlorperazine (COMPAZINE) 10 MG tablet Take 1 tablet (10 mg total) by mouth every 6 (six) hours as needed (Nausea or vomiting).  30 tablet  1  . sodium fluoride (FLUORISHIELD) 1.1 % GEL dental gel Instill one drop of fluoride per tooth space of fluoride tray. Place over teeth for 5 minutes. Remove. Spit out excess. Repeat nightly.  120 mL  prn   No current facility-administered medications for this visit.    PHYSICAL EXAMINATION: ECOG PERFORMANCE STATUS: 1 - Symptomatic  but completely ambulatory  Filed Vitals:   08/05/14 1124  BP: 120/66  Pulse: 99  Temp: 98.5 F (36.9 C)  Resp: 18   Filed Weights   08/05/14 1124  Weight: 167 lb 1.6 oz (75.796 kg)    GENERAL:alert, no distress and comfortable SKIN: skin color, texture, turgor are normal, no rashes or significant lesions EYES: normal, Conjunctiva are pink and non-injected, sclera clear OROPHARYNX:no exudate, no erythema and lips, buccal mucosa, and tongue normal  NECK: supple, thyroid normal size, non-tender, without nodularity LYMPH:  no palpable lymphadenopathy in the cervical, axillary or inguinal LUNGS: clear to auscultation and percussion with normal breathing effort HEART: regular rate & rhythm and no murmurs and no lower extremity edema ABDOMEN:abdomen soft, non-tender and normal bowel sounds. Feeding tube site looks okay Musculoskeletal:no cyanosis of digits and no clubbing  NEURO: alert & oriented x 3 with fluent speech, no focal motor/sensory deficits  LABORATORY DATA:  I have reviewed the data as listed    Component Value Date/Time   NA 141 08/05/2014 1114   NA 129* 06/25/2014 0125   K 4.0 08/05/2014 1114   K 4.4 06/25/2014 0125   CL 93* 06/25/2014 0125   CO2 28 08/05/2014 1114   CO2 26 06/25/2014 0125   GLUCOSE 127 08/05/2014 1114   GLUCOSE 128* 06/25/2014 0125   BUN 18.4 08/05/2014 1114   BUN 13 06/25/2014 0125   CREATININE 0.8 08/05/2014 1114   CREATININE 0.95 06/25/2014 0125   CALCIUM 9.3 08/05/2014 1114   CALCIUM 8.6 06/25/2014 0125   PROT 7.4 08/05/2014 1114   PROT 6.4 06/25/2014 0125   ALBUMIN 2.9* 08/05/2014 1114   ALBUMIN 2.9* 06/25/2014 0125   AST 104* 08/05/2014 1114   AST 739* 06/25/2014 0125   ALT 221* 08/05/2014 1114   ALT 1023* 06/25/2014 0125   ALKPHOS 56 08/05/2014 1114   ALKPHOS 62 06/25/2014 0125   BILITOT 0.46 08/05/2014 1114   BILITOT 1.3* 06/25/2014 0125   GFRNONAA >90 06/25/2014 0125   GFRAA >90 06/25/2014 0125    No results found for this basename: SPEP, UPEP,   kappa and lambda light chains    Lab Results  Component Value Date   WBC 5.3 07/29/2014   NEUTROABS 2.0 07/29/2014   HGB 13.0 07/29/2014   HCT 40.0 07/29/2014   MCV 97.7 07/29/2014   PLT 230 07/29/2014      Chemistry      Component Value Date/Time   NA 141 08/05/2014 1114   NA 129* 06/25/2014 0125   K 4.0 08/05/2014 1114   K 4.4 06/25/2014 0125   CL 93* 06/25/2014 0125   CO2 28 08/05/2014 1114   CO2 26 06/25/2014 0125   BUN 18.4 08/05/2014 1114   BUN 13 06/25/2014 0125   CREATININE 0.8 08/05/2014 1114   CREATININE 0.95 06/25/2014 0125      Component Value Date/Time   CALCIUM 9.3 08/05/2014 1114   CALCIUM 8.6 06/25/2014 0125   ALKPHOS 56 08/05/2014 1114   ALKPHOS 62 06/25/2014 0125   AST 104* 08/05/2014 1114   AST 739* 06/25/2014 0125   ALT 221* 08/05/2014 1114  ALT 1023* 06/25/2014 0125   BILITOT 0.46 08/05/2014 1114   BILITOT 1.3* 06/25/2014 0125      ASSESSMENT & PLAN:  Tonsil cancer Unfortunately, the patient had significant flare of hepatitis with his second dose of cisplatin. The patient had completed all his treatment In the meantime, I will continue aggressive supportive therapy. He is better since the last time I saw him.    Chronic hepatitis C without hepatic coma Infectious disease consultation is pending.  Elevated transaminase level  This is improving, likely due to liver hepatitis C. I recommend infectious disease consultation and he agreed.  S/P percutaneous endoscopic gastrostomy (PEG) tube placement His feeding tube looks okay with no signs of infection.    Unintentional weight loss This is improving. He will be visiting with nutritionist today. Hopefully we can remove the feeding tube in the future.   Orders Placed This Encounter  Procedures  . CBC with Differential    Standing Status: Standing     Number of Occurrences: 3     Standing Expiration Date: 08/06/2015   All questions were answered. The patient knows to call the clinic with any problems,  questions or concerns. No barriers to learning was detected. I spent 25 minutes counseling the patient face to face. The total time spent in the appointment was 30 minutes and more than 50% was on counseling and review of test results     [facility], [place], MD 08/05/2014 8:49 PM

## 2014-08-06 ENCOUNTER — Encounter (HOSPITAL_COMMUNITY): Payer: Self-pay | Admitting: Dentistry

## 2014-08-06 NOTE — Patient Instructions (Addendum)
RECOMMENDATIONS: 1. Brush after meals and at bedtime.  Use fluoride at bedtime. 2. Use trismus exercises as directed. 3. Use Biotene Rinse or salt water/baking soda rinses. 4. Multiple sips of water as needed. 5. Schedule visit with Dr. Nicki Reaper Minor for insertion of crown and periodontal therapy in one to 2 months.    Lenn Cal, DDS

## 2014-08-07 ENCOUNTER — Other Ambulatory Visit: Payer: BC Managed Care – PPO

## 2014-08-07 DIAGNOSIS — B182 Chronic viral hepatitis C: Secondary | ICD-10-CM

## 2014-08-08 LAB — HEPATITIS B SURFACE ANTIBODY,QUALITATIVE: Hep B S Ab: NEGATIVE

## 2014-08-08 LAB — ANA: Anti Nuclear Antibody(ANA): POSITIVE — AB

## 2014-08-08 LAB — HEPATITIS B SURFACE ANTIGEN: Hepatitis B Surface Ag: NEGATIVE

## 2014-08-08 LAB — PROTIME-INR
INR: 1.06 (ref <1.50)
Prothrombin Time: 13.8 seconds (ref 11.6–15.2)

## 2014-08-08 LAB — HEPATITIS A ANTIBODY, TOTAL: HEP A TOTAL AB: NONREACTIVE

## 2014-08-08 LAB — HIV ANTIBODY (ROUTINE TESTING W REFLEX): HIV: NONREACTIVE

## 2014-08-08 LAB — ANTI-NUCLEAR AB-TITER (ANA TITER): ANA Titer 1: NEGATIVE

## 2014-08-08 LAB — HEPATITIS B CORE ANTIBODY, TOTAL: Hep B Core Total Ab: NONREACTIVE

## 2014-08-08 LAB — IRON: IRON: 206 ug/dL — AB (ref 42–165)

## 2014-08-11 LAB — HEPATITIS C RNA QUANTITATIVE
HCV QUANT: 9730428 [IU]/mL — AB (ref <15)
HCV Quantitative Log: 6.99 {Log} — ABNORMAL HIGH (ref <1.18)

## 2014-08-12 ENCOUNTER — Ambulatory Visit: Payer: BC Managed Care – PPO

## 2014-08-12 ENCOUNTER — Ambulatory Visit: Payer: BC Managed Care – PPO | Attending: Radiation Oncology

## 2014-08-12 DIAGNOSIS — R1310 Dysphagia, unspecified: Secondary | ICD-10-CM | POA: Diagnosis not present

## 2014-08-12 DIAGNOSIS — IMO0001 Reserved for inherently not codable concepts without codable children: Secondary | ICD-10-CM | POA: Insufficient documentation

## 2014-08-12 DIAGNOSIS — C099 Malignant neoplasm of tonsil, unspecified: Secondary | ICD-10-CM | POA: Insufficient documentation

## 2014-08-13 LAB — HEPATITIS C GENOTYPE: HCV GENOTYPE: 2

## 2014-09-03 ENCOUNTER — Encounter: Payer: Self-pay | Admitting: Hematology and Oncology

## 2014-09-03 ENCOUNTER — Ambulatory Visit (HOSPITAL_BASED_OUTPATIENT_CLINIC_OR_DEPARTMENT_OTHER): Payer: BC Managed Care – PPO | Admitting: Hematology and Oncology

## 2014-09-03 ENCOUNTER — Ambulatory Visit: Payer: BC Managed Care – PPO | Admitting: Nutrition

## 2014-09-03 ENCOUNTER — Ambulatory Visit: Payer: BC Managed Care – PPO

## 2014-09-03 ENCOUNTER — Other Ambulatory Visit (HOSPITAL_BASED_OUTPATIENT_CLINIC_OR_DEPARTMENT_OTHER): Payer: BC Managed Care – PPO

## 2014-09-03 ENCOUNTER — Telehealth: Payer: Self-pay | Admitting: Hematology and Oncology

## 2014-09-03 ENCOUNTER — Ambulatory Visit (HOSPITAL_BASED_OUTPATIENT_CLINIC_OR_DEPARTMENT_OTHER): Payer: BC Managed Care – PPO

## 2014-09-03 VITALS — BP 105/65 | HR 83 | Temp 98.4°F | Resp 18 | Ht 68.0 in | Wt 167.0 lb

## 2014-09-03 DIAGNOSIS — Z09 Encounter for follow-up examination after completed treatment for conditions other than malignant neoplasm: Secondary | ICD-10-CM

## 2014-09-03 DIAGNOSIS — B182 Chronic viral hepatitis C: Secondary | ICD-10-CM

## 2014-09-03 DIAGNOSIS — C099 Malignant neoplasm of tonsil, unspecified: Secondary | ICD-10-CM

## 2014-09-03 DIAGNOSIS — R070 Pain in throat: Secondary | ICD-10-CM

## 2014-09-03 DIAGNOSIS — I890 Lymphedema, not elsewhere classified: Secondary | ICD-10-CM

## 2014-09-03 DIAGNOSIS — E46 Unspecified protein-calorie malnutrition: Secondary | ICD-10-CM | POA: Insufficient documentation

## 2014-09-03 HISTORY — DX: Lymphedema, not elsewhere classified: I89.0

## 2014-09-03 LAB — COMPREHENSIVE METABOLIC PANEL (CC13)
ALT: 430 U/L (ref 0–55)
ANION GAP: 7 meq/L (ref 3–11)
AST: 284 U/L (ref 5–34)
Albumin: 3.2 g/dL — ABNORMAL LOW (ref 3.5–5.0)
Alkaline Phosphatase: 50 U/L (ref 40–150)
BILIRUBIN TOTAL: 0.47 mg/dL (ref 0.20–1.20)
BUN: 17 mg/dL (ref 7.0–26.0)
CALCIUM: 9.5 mg/dL (ref 8.4–10.4)
CHLORIDE: 105 meq/L (ref 98–109)
CO2: 26 mEq/L (ref 22–29)
CREATININE: 0.8 mg/dL (ref 0.7–1.3)
GLUCOSE: 107 mg/dL (ref 70–140)
Potassium: 4.1 mEq/L (ref 3.5–5.1)
Sodium: 138 mEq/L (ref 136–145)
Total Protein: 7 g/dL (ref 6.4–8.3)

## 2014-09-03 LAB — CBC WITH DIFFERENTIAL/PLATELET
BASO%: 0.6 % (ref 0.0–2.0)
BASOS ABS: 0 10*3/uL (ref 0.0–0.1)
EOS%: 2.9 % (ref 0.0–7.0)
Eosinophils Absolute: 0.1 10*3/uL (ref 0.0–0.5)
HCT: 40.4 % (ref 38.4–49.9)
HEMOGLOBIN: 13.2 g/dL (ref 13.0–17.1)
LYMPH#: 1.8 10*3/uL (ref 0.9–3.3)
LYMPH%: 37 % (ref 14.0–49.0)
MCH: 31.4 pg (ref 27.2–33.4)
MCHC: 32.6 g/dL (ref 32.0–36.0)
MCV: 96.5 fL (ref 79.3–98.0)
MONO#: 0.9 10*3/uL (ref 0.1–0.9)
MONO%: 17.7 % — ABNORMAL HIGH (ref 0.0–14.0)
NEUT#: 2.1 10*3/uL (ref 1.5–6.5)
NEUT%: 41.8 % (ref 39.0–75.0)
PLATELETS: 166 10*3/uL (ref 140–400)
RBC: 4.19 10*6/uL — ABNORMAL LOW (ref 4.20–5.82)
RDW: 12.7 % (ref 11.0–14.6)
WBC: 5 10*3/uL (ref 4.0–10.3)

## 2014-09-03 MED ORDER — HEPARIN SOD (PORK) LOCK FLUSH 100 UNIT/ML IV SOLN
500.0000 [IU] | Freq: Once | INTRAVENOUS | Status: AC
  Administered 2014-09-03: 500 [IU] via INTRAVENOUS
  Filled 2014-09-03: qty 5

## 2014-09-03 MED ORDER — SODIUM CHLORIDE 0.9 % IJ SOLN
10.0000 mL | INTRAMUSCULAR | Status: DC | PRN
  Administered 2014-09-03: 10 mL via INTRAVENOUS
  Filled 2014-09-03: qty 10

## 2014-09-03 MED ORDER — MORPHINE SULFATE (CONCENTRATE) 20 MG/ML PO SOLN: 20.0000 mg | ORAL | Status: DC | PRN

## 2014-09-03 NOTE — Assessment & Plan Note (Signed)
Infectious disease consultation is pending.

## 2014-09-03 NOTE — Assessment & Plan Note (Signed)
His feeding tube looks okay with no signs of infection.

## 2014-09-03 NOTE — Assessment & Plan Note (Signed)
His pain is adequately controlled with current morphine. I refilled his prescription today.

## 2014-09-03 NOTE — Assessment & Plan Note (Signed)
Unfortunately, the patient had significant flare of hepatitis with his second dose of cisplatin. The patient had completed all his treatment In the meantime, I will continue aggressive supportive therapy. He is better since the last time I saw him. Plan to order a PET CT scan at the end of the year to assess response to treatment. Clinically, he has no signs of residual disease

## 2014-09-03 NOTE — Assessment & Plan Note (Signed)
He is doing well and has not lost weight. He wants to reach target goal weight to around 175 pounds. I recommend increase oral intake with plan to remove the feeding tube in the near future if possible.

## 2014-09-03 NOTE — Patient Instructions (Signed)

## 2014-09-03 NOTE — Assessment & Plan Note (Signed)
This is related to side effects of radiation treatment. I recommend physical therapy referral/to the lymphedema clinic

## 2014-09-03 NOTE — Progress Notes (Signed)
[facility] OFFICE PROGRESS NOTE  Patient Care Team: Gavin Pound, MD as PCP - General (Family Medicine) Brooks Sailors, RN as Oncology Nurse Navigator (Oncology)  SUMMARY OF ONCOLOGIC HISTORY: Oncology History   Tonsil cancer, Right, HPV positive,    Primary site: Pharynx - Oropharynx   Staging method: AJCC 7th Edition   Clinical: Stage IVA (T1, N2, M0) signed by Heath Lark, MD on 04/17/2014  1:04 PM   Summary: Stage IVA (T1, N2, M0)       Tonsil cancer   03/20/2014 Imaging Ct scan of neck showed several complex solid and cystic lesions within the right neck and abnormalities in the pharynx   03/25/2014 Procedure 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 Multiple hypermetabolic right level 2 lymph nodes compatible with metastatic adenopathy   05/10/2014 Surgery The patient has placement of port and feeding tube.   05/15/2014 - 06/11/2014 Chemotherapy He received high dose cisplatin every 3 weeks. He only received 2 doses due to severe liver function test abnormalities.   05/15/2014 - 07/04/2014 Radiation Therapy He received radiation treatment   06/05/2014 Adverse Reaction Cycle 2 of chemotherapy is delayed due to neutropenia & cycle 3 was abondoned due to severe hepatitis    INTERVAL HISTORY: Please see below for problem oriented charting. He is seen as part of his monthly supportive care visit. He has occasional difficulties with swallowing food and throat pain. He has not lost weight and he is eating almost 80% of his caloric intake by mouth. He denies any recent infection. He complained of swelling around his neck recently.  REVIEW OF SYSTEMS:   Constitutional: Denies fevers, chills or abnormal weight loss Eyes: Denies blurriness of vision Ears, nose, mouth, throat, and  face: Denies mucositis  Respiratory: Denies cough, dyspnea or wheezes Cardiovascular: Denies palpitation, chest discomfort or lower extremity swelling Gastrointestinal:  Denies nausea, heartburn or change in bowel habits Skin: Denies abnormal skin rashes Lymphatics: Denies new lymphadenopathy or easy bruising Neurological:Denies numbness, tingling or new weaknesses Behavioral/Psych: Mood is stable, no new changes  All other systems were reviewed with the patient and are negative.  I have reviewed the past medical history, past surgical history, social history and family history with the patient and they are unchanged from previous note.  ALLERGIES:  is allergic to sulfa antibiotics.  MEDICATIONS:  Current Outpatient Prescriptions  Medication Sig Dispense Refill  . Alum & Mag Hydroxide-Simeth (MAGIC MOUTHWASH W/LIDOCAINE) SOLN Take 10 mLs by mouth 4 (four) times daily as needed for mouth pain.      Marland Kitchen emollient (BIAFINE) cream Apply 1 application topically 3 (three) times daily.      Marland Kitchen lidocaine-prilocaine (EMLA) cream Apply 1 application topically as needed. Apply to [facility] a Cath site one hour prior to needle stick.  30 g  3  . morphine (ROXANOL) 20 MG/ML concentrated solution Take 1 mL (20 mg total) by mouth every 2 (two) hours as needed for severe pain.  120 mL  0  . ondansetron (ZOFRAN) 8 MG tablet Take 1 tablet (8 mg total) by mouth every 8 (eight) hours as needed for nausea or vomiting. Start on the third day after chemotherapy.  30 tablet  1  . polyethylene glycol (MIRALAX / GLYCOLAX) packet Take 17 g by mouth daily as needed (for constipation).      Marland Kitchen  prochlorperazine (COMPAZINE) 10 MG tablet Take 1 tablet (10 mg total) by mouth every 6 (six) hours as needed (Nausea or vomiting).  30 tablet  1  . sodium fluoride (FLUORISHIELD) 1.1 % GEL dental gel Instill one drop of fluoride per tooth space of fluoride tray. Place over teeth for 5 minutes. Remove. Spit out excess. Repeat nightly.  120  mL  prn   No current facility-administered medications for this visit.    PHYSICAL EXAMINATION: ECOG PERFORMANCE STATUS: 1 - Symptomatic but completely ambulatory  Filed Vitals:   09/03/14 1249  BP: 105/65  Pulse: 83  Temp: 98.4 F (36.9 C)  Resp: 18   Filed Weights   09/03/14 1249  Weight: 167 lb (75.751 kg)    GENERAL:alert, no distress and comfortable SKIN: skin color, texture, turgor are normal, no rashes or significant lesions EYES: normal, Conjunctiva are pink and non-injected, sclera clear OROPHARYNX:no exudate, no erythema and lips, buccal mucosa, and tongue normal  NECK: supple, thyroid normal size, non-tender, without nodularity LYMPH:  no palpable lymphadenopathy in the cervical, axillary or inguinal LUNGS: clear to auscultation and percussion with normal breathing effort HEART: regular rate & rhythm and no murmurs and no lower extremity edema ABDOMEN:abdomen soft, non-tender and normal bowel sounds the gastrostomy tube looks fine Musculoskeletal:no cyanosis of digits and no clubbing  NEURO: alert & oriented x 3 with fluent speech, no focal motor/sensory deficits  LABORATORY DATA:  I have reviewed the data as listed    Component Value Date/Time   NA 138 09/03/2014 1216   NA 129* 06/25/2014 0125   K 4.1 09/03/2014 1216   K 4.4 06/25/2014 0125   CL 93* 06/25/2014 0125   CO2 26 09/03/2014 1216   CO2 26 06/25/2014 0125   GLUCOSE 107 09/03/2014 1216   GLUCOSE 128* 06/25/2014 0125   BUN 17.0 09/03/2014 1216   BUN 13 06/25/2014 0125   CREATININE 0.8 09/03/2014 1216   CREATININE 0.95 06/25/2014 0125   CALCIUM 9.5 09/03/2014 1216   CALCIUM 8.6 06/25/2014 0125   PROT 7.0 09/03/2014 1216   PROT 6.4 06/25/2014 0125   ALBUMIN 3.2* 09/03/2014 1216   ALBUMIN 2.9* 06/25/2014 0125   AST 284* 09/03/2014 1216   AST 739* 06/25/2014 0125   ALT 430* 09/03/2014 1216   ALT 1023* 06/25/2014 0125   ALKPHOS 50 09/03/2014 1216   ALKPHOS 62 06/25/2014 0125   BILITOT 0.47 09/03/2014  1216   BILITOT 1.3* 06/25/2014 0125   GFRNONAA >90 06/25/2014 0125   GFRAA >90 06/25/2014 0125    No results found for this basename: SPEP, UPEP,  kappa and lambda light chains    Lab Results  Component Value Date   WBC 5.0 09/03/2014   NEUTROABS 2.1 09/03/2014   HGB 13.2 09/03/2014   HCT 40.4 09/03/2014   MCV 96.5 09/03/2014   PLT 166 09/03/2014      Chemistry      Component Value Date/Time   NA 138 09/03/2014 1216   NA 129* 06/25/2014 0125   K 4.1 09/03/2014 1216   K 4.4 06/25/2014 0125   CL 93* 06/25/2014 0125   CO2 26 09/03/2014 1216   CO2 26 06/25/2014 0125   BUN 17.0 09/03/2014 1216   BUN 13 06/25/2014 0125   CREATININE 0.8 09/03/2014 1216   CREATININE 0.95 06/25/2014 0125      Component Value Date/Time   CALCIUM 9.5 09/03/2014 1216   CALCIUM 8.6 06/25/2014 0125   ALKPHOS 50 09/03/2014 1216   ALKPHOS 62 06/25/2014  0125   AST 284* 09/03/2014 1216   AST 739* 06/25/2014 0125   ALT 430* 09/03/2014 1216   ALT 1023* 06/25/2014 0125   BILITOT 0.47 09/03/2014 1216   BILITOT 1.3* 06/25/2014 0125     ASSESSMENT & PLAN:  Tonsil cancer Unfortunately, the patient had significant flare of hepatitis with his second dose of cisplatin. The patient had completed all his treatment In the meantime, I will continue aggressive supportive therapy. He is better since the last time I saw him. Plan to order a PET CT scan at the end of the year to assess response to treatment. Clinically, he has no signs of residual disease  Chronic hepatitis C without hepatic coma Infectious disease consultation is pending.    S/P gastrostomy tube (G tube) placement, follow-up exam His feeding tube looks okay with no signs of infection.   Lymphedema This is related to side effects of radiation treatment. I recommend physical therapy referral/to the lymphedema clinic   Throat pain His pain is adequately controlled with current morphine. I refilled his prescription today.    Protein calorie  malnutrition He is doing well and has not lost weight. He wants to reach target goal weight to around 175 pounds. I recommend increase oral intake with plan to remove the feeding tube in the near future if possible.   Orders Placed This Encounter  Procedures  . Ambulatory referral to Physical Therapy    Referral Priority:  Routine    Referral Type:  Physical Medicine    Referral Reason:  Specialty Services Required    Requested Specialty:  Physical Therapy    Number of Visits Requested:  1   All questions were answered. The patient knows to call the clinic with any problems, questions or concerns. No barriers to learning was detected. I spent 30 minutes counseling the patient face to face. The total time spent in the appointment was 40 minutes and more than 50% was on counseling and review of test results     [facility], [place], MD 09/03/2014 9:23 PM

## 2014-09-03 NOTE — Telephone Encounter (Signed)
Pt confirmed ov per 10/20 POF, called PT no answer will c/b to sch pt, gave pt AVS.... KJ

## 2014-09-03 NOTE — Progress Notes (Signed)
error 

## 2014-09-03 NOTE — Progress Notes (Signed)
Nutrition followup completed with patient and wife.  Patient reports he continues to work to increase oral nutrition by mouth.  He is only using one bottle of impact 1.5 daily through his feeding tube.  He does not like the taste of boost plus or Ensure Plus and has resorted to [employer] essentials 3 times a day along with meals.  Weight is stable and documented as 167 pounds October 20 from 167.1 pounds September 21.  Patient describes a variety of foods he is tolerating.  He continues to only tolerate small amounts of bland food at one time.  Taste alterations and texture continues to be problematic for him.  Nutrition diagnosis: Food and nutrition related knowledge deficit resolved.  Intervention: Patient to continue meals 3 times a day along with Carnation breakfast shakes 3 times a day between meals.  Patient will work to increase volumes. He will continue to try different textures and varieties of foods.   Educated patient on strategies for adding fiber to diet to improve constipation. Recommended patient work to DC final bottle of impact 1.5 via feeding tube by increasing oral intake to promote weight maintenance. Questions were answered.  Teach back method used.  Monitoring, evaluation, goals: Patient has tolerated increased oral intake and has had weight stabilization over the past month.  He will now work to discontinue the last bottle of tube feeding by increasing oral intake.  Next visit: Approximately one month.  **Disclaimer: This note was dictated with voice recognition software. Similar sounding words can inadvertently be transcribed and this note may contain transcription errors which may not have been corrected upon publication of note.**

## 2014-09-05 ENCOUNTER — Encounter: Payer: Self-pay | Admitting: Internal Medicine

## 2014-09-05 ENCOUNTER — Ambulatory Visit (INDEPENDENT_AMBULATORY_CARE_PROVIDER_SITE_OTHER): Payer: BLUE CROSS/BLUE SHIELD | Admitting: Internal Medicine

## 2014-09-05 ENCOUNTER — Ambulatory Visit: Payer: BC Managed Care – PPO | Attending: Radiation Oncology | Admitting: Physical Therapy

## 2014-09-05 VITALS — BP 122/84 | HR 111 | Temp 98.1°F | Ht 68.0 in | Wt 166.0 lb

## 2014-09-05 DIAGNOSIS — I890 Lymphedema, not elsewhere classified: Secondary | ICD-10-CM | POA: Insufficient documentation

## 2014-09-05 DIAGNOSIS — C099 Malignant neoplasm of tonsil, unspecified: Secondary | ICD-10-CM | POA: Diagnosis not present

## 2014-09-05 DIAGNOSIS — Z9221 Personal history of antineoplastic chemotherapy: Secondary | ICD-10-CM | POA: Diagnosis not present

## 2014-09-05 DIAGNOSIS — B182 Chronic viral hepatitis C: Secondary | ICD-10-CM

## 2014-09-05 DIAGNOSIS — Z923 Personal history of irradiation: Secondary | ICD-10-CM | POA: Insufficient documentation

## 2014-09-05 DIAGNOSIS — Z23 Encounter for immunization: Secondary | ICD-10-CM

## 2014-09-05 NOTE — Addendum Note (Signed)
Addended by: Myrtis Hopping A on: 09/05/2014 12:10 PM   Modules accepted: Orders

## 2014-09-05 NOTE — Progress Notes (Signed)
+Christopher Mullins is a 58 y.o. male who presents for initial evaluation and management of a positive Hepatitis C antibody test.  Patient tested positive 2002. Hepatitis C risk factors present are: history of blood transfusion (details: had a splenectomy and transfusion at age 41). Patient denies accidental needle stick, acupuncture, intranasal drug use, IV drug abuse, multiple sexual partners, renal dialysis, sexual contact with person with liver disease, tattoos. Patient has had other studies performed. Results: hepatitis C RNA by PCR, result: positive. Patient has not had prior treatment for Hepatitis C. Patient does not have a past history of liver disease. Patient does not have a family history of liver disease.   HPI: He was evaluated in 2002 and had a biopsy then of F0, A2.  Did not get tretament.  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  He is to get a PET scan in Dec to see if it returned or if he is cured.  His LFTs significantly elevated during chemo, possibly due to reactivation of the hepatitis C.    Patient does not have documented immunity to Hepatitis A. Patient does not have documented immunity to Hepatitis B.     Review of Systems A comprehensive review of systems was negative.   Past Medical History  Diagnosis Date  . Psoriasis   . Hepatitis C 1977    from blood transfusion  . 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 Cancer   . Conjunctivitis 05/13/2014  . Throat pain 05/30/2014  . Constipation 05/30/2014  . HIV infection   . Thrush 07/10/2014  . S/P radiation therapy 05/15/2014-07/04/2014       Right tonsil and bilateral neck / 70 Gy in 35 fractions to gross disease, 63 Gy in 35 fractions to high risk nodal echelons, and 56 Gy in 35 fractions to intermediate risk nodal echelons    . Status post chemotherapy 05/15/14- 06/11/14    high dose cisplatin every 3 weeks. He only received 2 doses due  to severe liver function test abnormalities  . Lymphedema 09/03/2014    Prior to Admission medications   Medication Sig Start Date End Date Taking? Authorizing Provider  emollient (BIAFINE) cream Apply 1 application topically 3 (three) times daily.   Yes Historical Provider, MD  morphine (ROXANOL) 20 MG/ML concentrated solution Take 1 mL (20 mg total) by mouth every 2 (two) hours as needed for severe pain. 09/03/14  Yes Heath Lark, MD  polyethylene glycol (MIRALAX / GLYCOLAX) packet Take 17 g by mouth daily as needed (for constipation).   Yes Historical Provider, MD  sodium fluoride (FLUORISHIELD) 1.1 % GEL dental gel Instill one drop of fluoride per tooth space of fluoride tray. Place over teeth for 5 minutes. Remove. Spit out excess. Repeat nightly. 04/23/14  Yes Lenn Cal, DDS  Alum & Mag Hydroxide-Simeth (MAGIC MOUTHWASH W/LIDOCAINE) SOLN Take 10 mLs by mouth 4 (four) times daily as needed for mouth pain.    Historical Provider, MD  lidocaine-prilocaine (EMLA) cream Apply 1 application topically as needed. Apply to [facility] a Cath site one hour prior to needle stick. 05/13/14   Heath Lark, MD    Allergies  Allergen Reactions  . Sulfa Antibiotics Other (See Comments)    Childhood reaction    History  Substance Use Topics  . Smoking status: Former Smoker -- 1.00 packs/day for 30 years    Types: Cigarettes  Quit date: 11/16/2011  . Smokeless tobacco: Never Used  . Alcohol Use: Yes     Comment: occasional last use 03/15/14    Family History  Problem Relation Age of Onset  . Cancer Neg Hx       Objective:   Filed Vitals:   09/05/14 0856  BP: 122/84  Pulse: 111  Temp: 98.1 F (36.7 C)   in no apparent distress and alert HEENT: anicteric Cor RRR and No murmurs clear Bowel sounds are normal, liver is not enlarged, spleen is not enlarged peripheral pulses normal, no pedal edema, no clubbing or cyanosis negative for - jaundice, spider hemangioma, telangiectasia, palmar  erythema, ecchymosis and atrophy  Laboratory Genotype:  Lab Results  Component Value Date   HCVGENOTYPE 2 08/07/2014   HCV viral load:  Lab Results  Component Value Date   WBC 5.0 09/03/2014   HGB 13.2 09/03/2014   HCT 40.4 09/03/2014   MCV 96.5 09/03/2014   PLT 166 09/03/2014    Lab Results  Component Value Date   CREATININE 0.8 09/03/2014   BUN 17.0 09/03/2014   NA 138 09/03/2014   K 4.1 09/03/2014   CL 93* 06/25/2014   CO2 26 09/03/2014    Lab Results  Component Value Date   ALT 430* 09/03/2014   AST 284* 09/03/2014   ALKPHOS 50 09/03/2014   BILITOT 0.47 09/03/2014     Assessment: Hepatitis C genotype 2  Plan: 1) Patient counseled extensively on limiting acetaminophen to no more than 2 grams daily, avoidance of alcohol. 2) Transmission discussed with patient including sexual transmission, sharing razors and toothbrush.   3) Will need referral to gastroenterology if elastography F4 or signs of cirrhosis 4) Will need referral for substance abuse counseling: No. 5) Will prescribe sofosbuvir and weight based ribavirin for 12 weeks once we get elastography.  Counseled the patient on side effects and monitoring.   6) Follow up after elastography to go over results.

## 2014-09-05 NOTE — Patient Instructions (Signed)
Date 09/05/2014  Dear Mr. Eisenhardt, As discussed in the [facility], your hepatitis C therapy will include the following medications:     sofosbuvir 400 mg oral daily + ribavirin 1000 mg oral daily                                              OR sofosbuvir 400 mg oral daily + daclatasvir 60 mg oral daily  Please note that ALL MEDICATIONS WILL START ON THE SAME DATE for a total of 12 weeks. ---------------------------------------------------------------- Your HCV Treatment Start Date: TBA   Your HCV genotype: 2    Liver Fibrosis: TBD    ---------------------------------------------------------------- YOUR PHARMACY CONTACT:   [place] [facility] [place] Phone: [phone] Hours: Monday to Friday 7:30 am to 6:00 pm   Please always contact your pharmacy at least 3-4 business days before you run out of medications to ensure your next month's medication is ready or 1 week prior to running out if you receive it by mail.  Remember, each prescription is for 28 days. ---------------------------------------------------------------- GENERAL NOTES REGARDING YOUR HEPATITIS C MEDICATION:  RIBAVIRIN - Ribavirin is dosed twice daily approximately every 10-12 hrs with or without food. - Ribavirin can be taken with food to decrease GI upset.  - Ribavirin is contraindicated in pregnancy, and we recommend two effective forms of contraception while on HCV therapy, and for 6 months after completing therapy. - Common Side Effects with Ribavirin:      1. Anemia      2. Nausea      3. Dry Cough      4. Rash      5. Insomnia      6. Photosensitivity  SOFOSBUVIR (SOVALDI): - Sofosbuvir 400 mg tablet is taken daily with OR without food. - The tablets are yellow. - The tablets should be stored at room temperature. - The common side effects with sofosbuvir:      1. Fatigue      2. Headache  - Acid reducing agents such as H2 blockers  (ie. Pepcid (famotidine), Zantac (ranitidine), Tagamet (cimetidine), Axid (nizatidine) and proton pump inhibitors (ie. Prilosec (omeprazole), Protonix (pantoprazole), Nexium (esomeprazole), or Aciphex (rabeprazole)) can decrease effectiveness of Harvoni. Do not take until you have discussed with a health care provider.    -Antacids that contain magnesium and/or aluminum hydroxide (ie. Milk of Magensia, Rolaids, Gaviscon, Maalox, Mylanta, an dArthritis Pain Formula)can reduce absorption of sofosbuvir, so take them at least 4 hours before or after Harvoni.  -Calcium carbonate (calcium supplements or antacids such as Tums, Caltrate, Os-Cal)needs to be taken at least 4 hours hours before or after sofosbuvir.  -St. John's wort or any products that contain St. John's wort like some herbal supplements  Please inform the office prior to starting any of these medications.  - The common side effects with sofosbuvir:      1. Fatigue      2. Headache      3. Nausea      4. Diarrhea      5. Insomnia  DACLATASVIR [facility]) - The common side effects with daclatasvir:      1. Fatigue      2. Headache   Support Path is a suite of resources designed to help patients start with HARVONI and SOVALDI move toward treatment completion  GETTING STARTED Support Path helps patients access therapy and get off to an efficient start  Benefits investigation and prior authorization support Co-pay and other financial assistance A specialty pharmacy finder CO-PAY COUPON The sofosbuvir co-pay coupon may help eligible patients lower their out-of-pocket costs. With a co-pay coupon, most eligible patients may pay no more than $5 per co-pay (restrictions apply) www.harvoni.com call 2708338795 Not valid for patients enrolled in government healthcare prescription drug programs, such as Medicare Part D and Medicaid. Patients in the coverage gap known as the "donut hole" also are not eligible   Please note that this  only lists the most common side effects and is NOT a comprehensive list of the potential side effects of these medications. For more information, please review the drug information sheets that come with your medication package from the pharmacy.  ---------------------------------------------------------------- GENERAL HELPFUL HINTS ON HCV THERAPY: 1. No alcohol. 2. Protect against sun-sensitivity/sunburns (wear sunglasses, hat, long sleeves, pants and sunscreen). 3. Stay well-hydrated/well-moisturized. 4. Notify the [facility] of any changes in your other over-the-counter/herbal or prescription medications. 5. If you miss a dose of your medication, take the missed dose as soon as you remember. Return to your regular time/dose schedule the next day.  6.  Do not stop taking your medications without first talking with your healthcare provider. 7.  You may take Tylenol (acetaminophen), as long as the dose is less than 2000 mg (OR no more than 4 tablets of the Tylenol Extra Strengths 500mg  tablet) in 24 hours. 8.  You will need to obtain routine labs and/or office visits at [facility] at weeks 2, 4, 8,  and 12 as well as 12 and 24 weeks after completion of treatment.   Scharlene Gloss, [place] for [place] [address], [place]  [phone]

## 2014-09-10 ENCOUNTER — Ambulatory Visit: Payer: BC Managed Care – PPO | Admitting: Physical Therapy

## 2014-09-10 DIAGNOSIS — I890 Lymphedema, not elsewhere classified: Secondary | ICD-10-CM | POA: Diagnosis not present

## 2014-09-11 ENCOUNTER — Ambulatory Visit (HOSPITAL_COMMUNITY)
Admission: RE | Admit: 2014-09-11 | Discharge: 2014-09-11 | Disposition: A | Discharge status: Home / Self Care | Payer: BC Managed Care – PPO | Source: Ambulatory Visit | Attending: Internal Medicine | Admitting: Internal Medicine

## 2014-09-11 DIAGNOSIS — B182 Chronic viral hepatitis C: Secondary | ICD-10-CM

## 2014-09-12 ENCOUNTER — Ambulatory Visit: Payer: BC Managed Care – PPO | Admitting: Physical Therapy

## 2014-09-12 DIAGNOSIS — I890 Lymphedema, not elsewhere classified: Secondary | ICD-10-CM | POA: Diagnosis not present

## 2014-09-13 ENCOUNTER — Other Ambulatory Visit (HOSPITAL_COMMUNITY): Payer: BC Managed Care – PPO

## 2014-09-17 ENCOUNTER — Ambulatory Visit: Payer: BC Managed Care – PPO | Attending: Radiation Oncology | Admitting: Physical Therapy

## 2014-09-17 DIAGNOSIS — I890 Lymphedema, not elsewhere classified: Secondary | ICD-10-CM | POA: Diagnosis not present
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
## 2014-09-18 ENCOUNTER — Ambulatory Visit (HOSPITAL_COMMUNITY)
Admission: RE | Admit: 2014-09-18 | Discharge: 2014-09-18 | Disposition: A | Discharge status: Home / Self Care | Payer: BC Managed Care – PPO | Source: Ambulatory Visit | Attending: Diagnostic Radiology | Admitting: Diagnostic Radiology

## 2014-09-18 ENCOUNTER — Ambulatory Visit: Payer: BC Managed Care – PPO | Admitting: Physical Therapy

## 2014-09-18 DIAGNOSIS — I890 Lymphedema, not elsewhere classified: Secondary | ICD-10-CM | POA: Diagnosis not present

## 2014-09-18 DIAGNOSIS — Z9081 Acquired absence of spleen: Secondary | ICD-10-CM | POA: Diagnosis not present

## 2014-09-18 DIAGNOSIS — B182 Chronic viral hepatitis C: Secondary | ICD-10-CM | POA: Insufficient documentation

## 2014-09-18 DIAGNOSIS — K7689 Other specified diseases of liver: Secondary | ICD-10-CM | POA: Diagnosis not present
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
## 2014-09-19 ENCOUNTER — Other Ambulatory Visit: Payer: Self-pay | Admitting: Hematology and Oncology

## 2014-09-19 ENCOUNTER — Other Ambulatory Visit: Payer: Self-pay | Admitting: Internal Medicine

## 2014-09-19 MED ORDER — RIBAVIRIN 200 MG PO CAPS: 200.0000 mg | ORAL_CAPSULE | Freq: Two times a day (BID) | ORAL | Status: DC

## 2014-09-19 MED ORDER — SOFOSBUVIR 400 MG PO TABS: 1.0000 | ORAL_TABLET | Freq: Every day | ORAL | Status: DC

## 2014-09-22 IMAGING — CR DG CHEST 2V
2 series · 2 of 2 positions shown · non-contrast
Comparison: Chest radiograph performed 05/10/2014

CLINICAL DATA: Fever. On chemotherapy and radiation therapy for
tonsillar cancer.

EXAM:
CHEST  2 VIEW

[w chest pa]
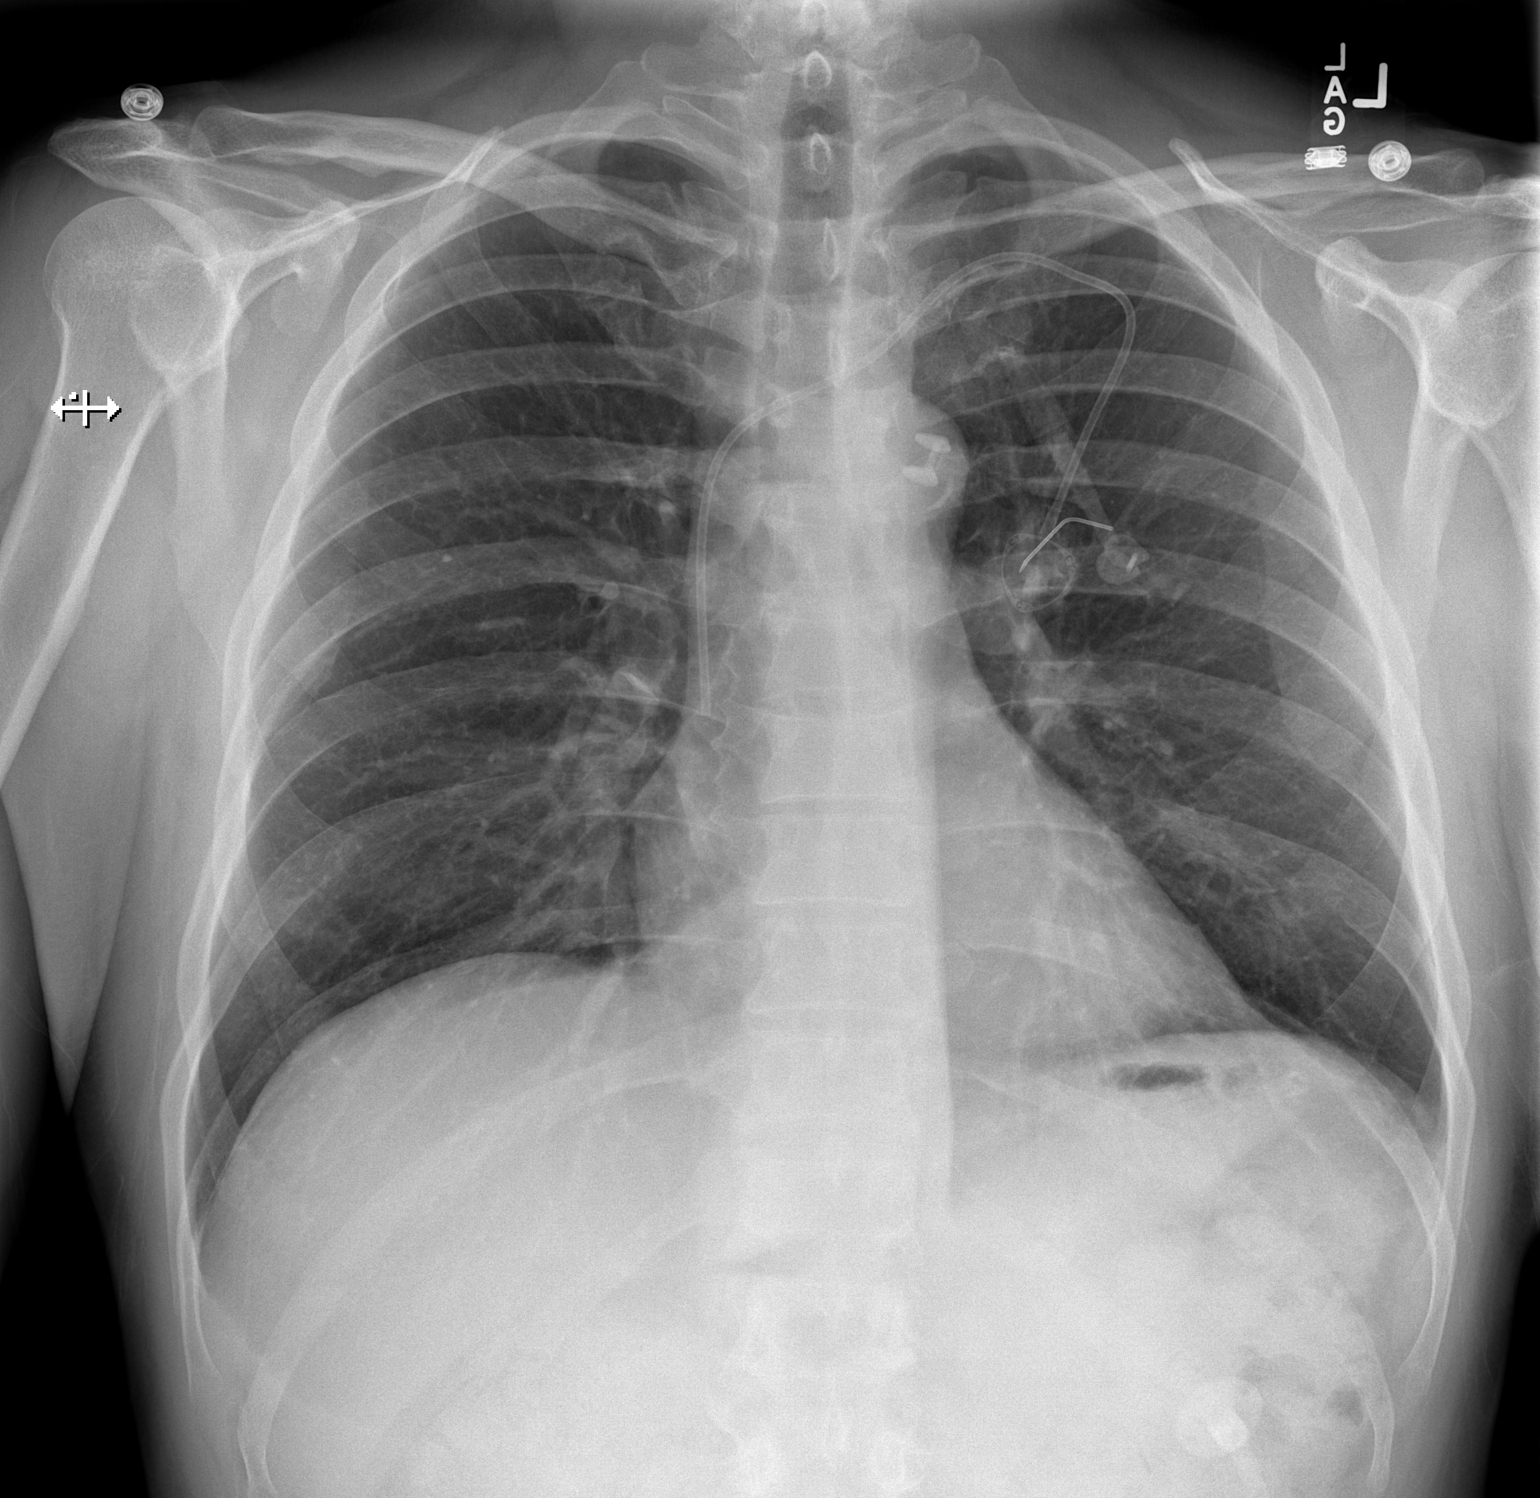

[w chest lat]
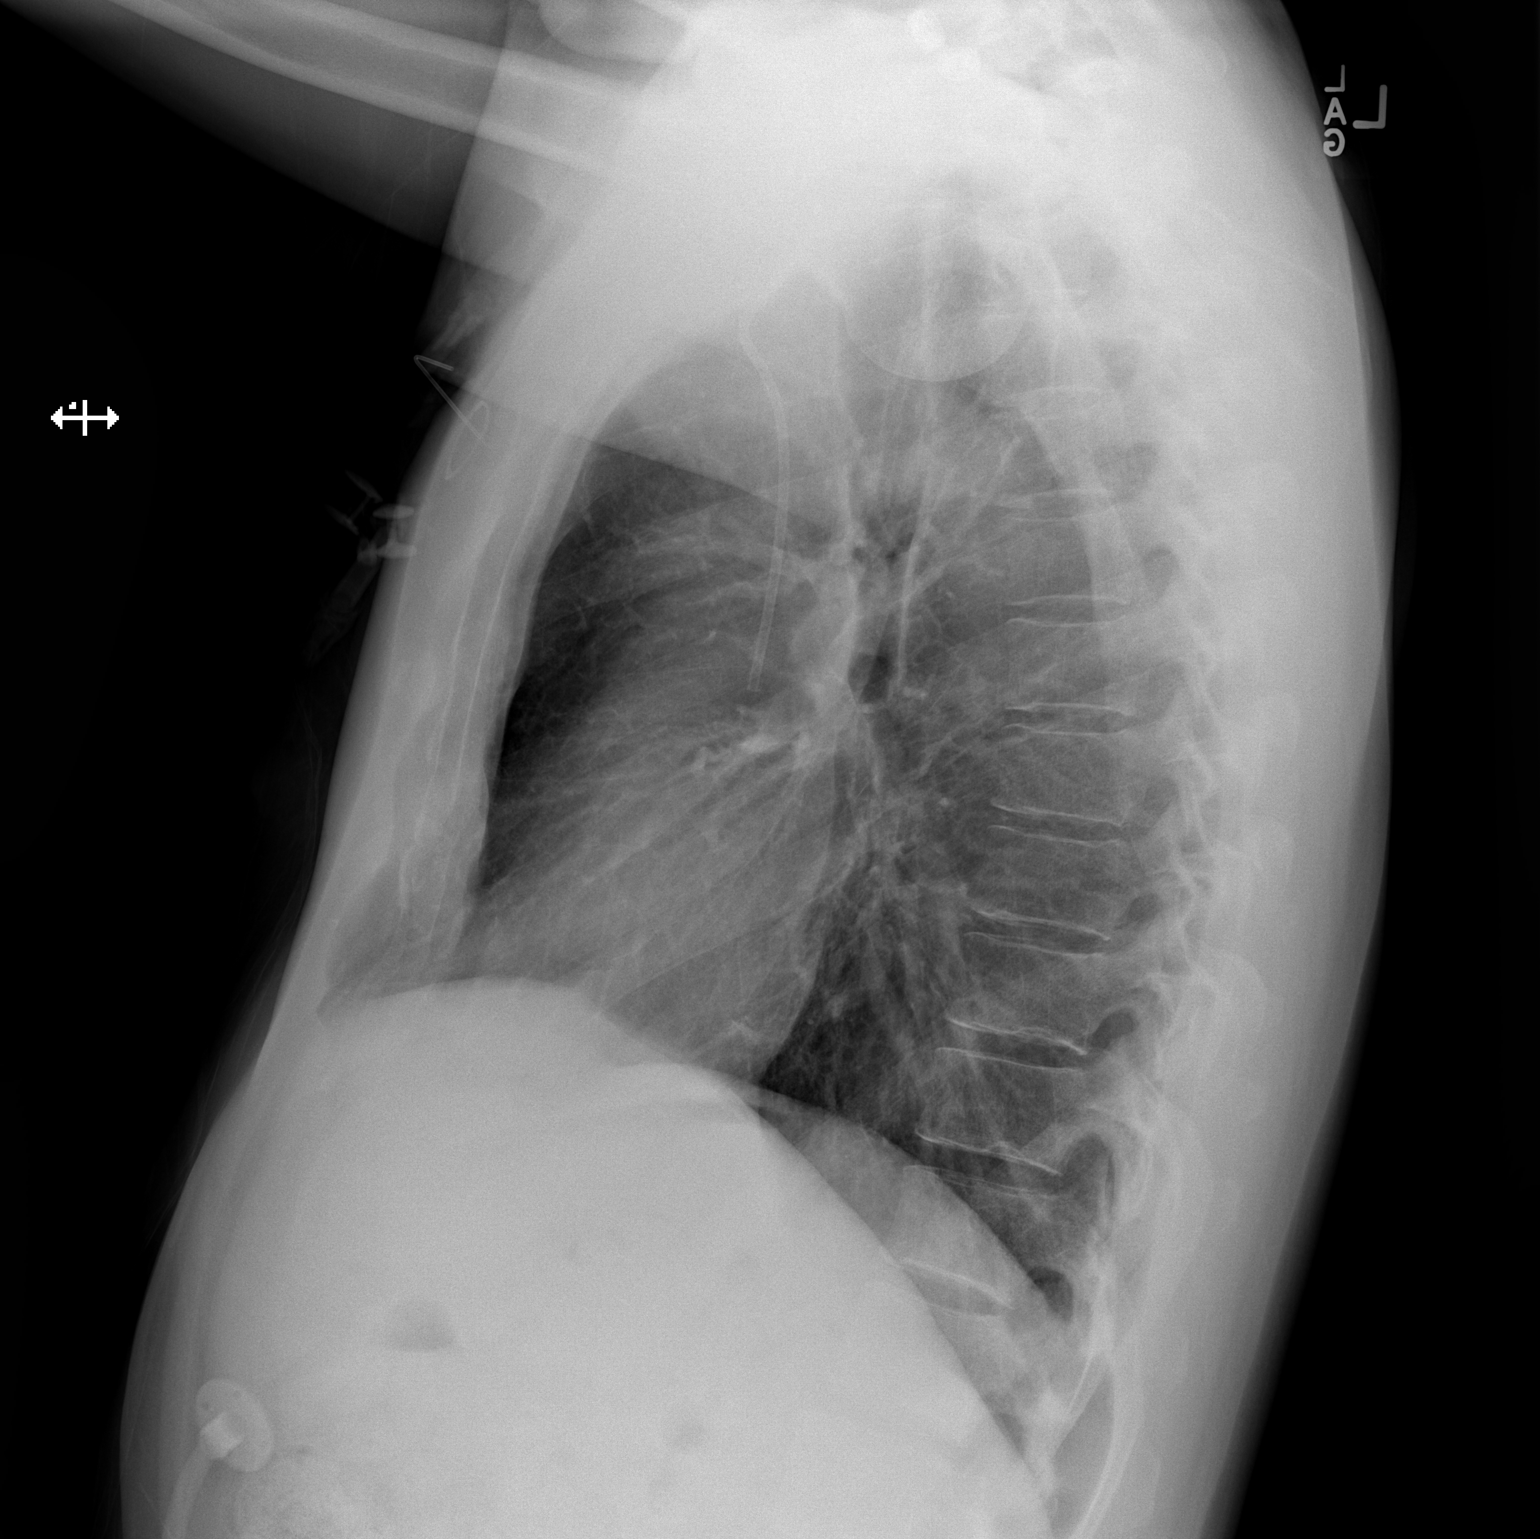

[2 of 2 positions shown; findings below may reference images not displayed]

FINDINGS: The lungs are well-aerated and clear. There is no evidence of focal
opacification, pleural effusion or pneumothorax.

The heart is normal in size; the mediastinal contour is within
normal limits. A left-sided chest port is noted ending about the
distal SVC. No acute osseous abnormalities are seen.
IMPRESSION: No acute cardiopulmonary process seen.

## 2014-09-25 ENCOUNTER — Telehealth: Payer: Self-pay | Admitting: Physical Therapy

## 2014-09-25 ENCOUNTER — Ambulatory Visit: Payer: BC Managed Care – PPO | Admitting: Physical Therapy

## 2014-09-25 NOTE — Telephone Encounter (Signed)
Called pt to inquire if he still coming to his 0930 appt, he is in [place] and forgot the appt, he will come next week, Maudry Diego PT notified

## 2014-09-26 ENCOUNTER — Encounter: Payer: Self-pay | Admitting: Internal Medicine

## 2014-09-26 ENCOUNTER — Ambulatory Visit (INDEPENDENT_AMBULATORY_CARE_PROVIDER_SITE_OTHER): Payer: BLUE CROSS/BLUE SHIELD | Admitting: Internal Medicine

## 2014-09-26 VITALS — BP 108/71 | HR 87 | Temp 98.2°F | Ht 68.0 in | Wt 165.0 lb

## 2014-09-26 DIAGNOSIS — B182 Chronic viral hepatitis C: Secondary | ICD-10-CM | POA: Diagnosis not present

## 2014-09-26 MED ORDER — SOFOSBUVIR 400 MG PO TABS: 1.0000 | ORAL_TABLET | Freq: Every day | ORAL | Status: DC

## 2014-09-26 MED ORDER — RIBAVIRIN 200 MG PO CAPS: 200.0000 mg | ORAL_CAPSULE | Freq: Two times a day (BID) | ORAL | Status: DC

## 2014-09-26 MED ORDER — RIBAVIRIN 200 MG PO CAPS: 1000.0000 mg | ORAL_CAPSULE | Freq: Two times a day (BID) | ORAL | Status: DC

## 2014-09-26 NOTE — Progress Notes (Signed)
   Subjective:    Patient ID: Christopher Mullins, male    DOB: Aug 07, 1956, 58 y.o.   MRN: [id number]  HPI Here for follow up of HCV, genotype 2.  Never treated.  Now approved for Solvaldi and ribavirin 1000 mg divided bid for 12 weeks.  To start next week after return from [place] on elastography.     Review of Systems  Constitutional: Negative for fatigue.  Gastrointestinal: Negative for diarrhea.  Skin: Negative for rash.  Neurological: Negative for dizziness, light-headedness and headaches.       Objective:   Physical Exam  Constitutional: He appears well-developed and well-nourished. No distress.  HENT:  Mouth/Throat: No oropharyngeal exudate.  Eyes: No scleral icterus.  Cardiovascular: Normal rate, regular rhythm and normal heart sounds.           Assessment & Plan:

## 2014-09-26 NOTE — Assessment & Plan Note (Addendum)
Approved and to start.  Will check cbc cmp in 2 weeks after starting and he will follow up 2 weeks after that and I will repeat cbc and check RNA then.  Discussed side effects, handout provided.

## 2014-09-26 NOTE — Patient Instructions (Signed)
Date  Dear , As discussed in the [facility], your hepatitis C therapy will include the following medications:     sofosbuvir 400 mg oral daily + ribavirin 1000 mg/1200 mg oral daily                                              OR sofosbuvir 400 mg oral daily + daclatasvir 60 mg oral daily  Please note that ALL MEDICATIONS WILL START ON THE SAME DATE for a total of 12 weeks. ---------------------------------------------------------------- Your HCV Treatment Start Date: 09/30/14  Your HCV genotype: 2    Liver Fibrosis: F2/F3    ---------------------------------------------------------------- YOUR PHARMACY CONTACT:   [place] [facility] [place] Phone: [phone] Hours: Monday to Friday 7:30 am to 6:00 pm   Please always contact your pharmacy at least 3-4 business days before you run out of medications to ensure your next month's medication is ready or 1 week prior to running out if you receive it by mail.  Remember, each prescription is for 28 days. ---------------------------------------------------------------- GENERAL NOTES REGARDING YOUR HEPATITIS C MEDICATION:  RIBAVIRIN - Ribavirin is dosed twice daily approximately every 10-12 hrs with or without food. - Ribavirin can be taken with food to decrease GI upset.  - Ribavirin is contraindicated in pregnancy, and we recommend two effective forms of contraception while on HCV therapy, and for 6 months after completing therapy. - Common Side Effects with Ribavirin:      1. Anemia      2. Nausea      3. Dry Cough      4. Rash      5. Insomnia      6. Photosensitivity  SOFOSBUVIR (SOVALDI): - Sofosbuvir 400 mg tablet is taken daily with OR without food. - The tablets are yellow. - The tablets should be stored at room temperature. - The common side effects with sofosbuvir:      1. Fatigue      2. Headache  - Acid reducing agents such as H2 blockers (ie. Pepcid  (famotidine), Zantac (ranitidine), Tagamet (cimetidine), Axid (nizatidine) and proton pump inhibitors (ie. Prilosec (omeprazole), Protonix (pantoprazole), Nexium (esomeprazole), or Aciphex (rabeprazole)) can decrease effectiveness of Harvoni. Do not take until you have discussed with a health care provider.    -Antacids that contain magnesium and/or aluminum hydroxide (ie. Milk of Magensia, Rolaids, Gaviscon, Maalox, Mylanta, an dArthritis Pain Formula)can reduce absorption of sofosbuvir, so take them at least 4 hours before or after Harvoni.  -Calcium carbonate (calcium supplements or antacids such as Tums, Caltrate, Os-Cal)needs to be taken at least 4 hours hours before or after sofosbuvir.  -St. John's wort or any products that contain St. John's wort like some herbal supplements  Please inform the office prior to starting any of these medications.  - The common side effects with sofosbuvir:      1. Fatigue      2. Headache      3. Nausea      4. Diarrhea      5. Insomnia  DACLATASVIR [facility]) - The common side effects with daclatasvir:      1. Fatigue      2. Headache   Support Path is a suite of resources designed to help patients start with HARVONI and SOVALDI move toward treatment completion GETTING STARTED  Support Path helps patients access therapy and get off to an efficient start  Benefits investigation and prior authorization support Co-pay and other financial assistance A specialty pharmacy finder CO-PAY COUPON The sofosbuvir co-pay coupon may help eligible patients lower their out-of-pocket costs. With a co-pay coupon, most eligible patients may pay no more than $5 per co-pay (restrictions apply) www.harvoni.com call 904 660 3767 Not valid for patients enrolled in government healthcare prescription drug programs, such as Medicare Part D and Medicaid. Patients in the coverage gap known as the "donut hole" also are not eligible   Please note that this only lists  the most common side effects and is NOT a comprehensive list of the potential side effects of these medications. For more information, please review the drug information sheets that come with your medication package from the pharmacy.  ---------------------------------------------------------------- GENERAL HELPFUL HINTS ON HCV THERAPY: 1. No alcohol. 2. Protect against sun-sensitivity/sunburns (wear sunglasses, hat, long sleeves, pants and sunscreen). 3. Stay well-hydrated/well-moisturized. 4. Notify the [facility] of any changes in your other over-the-counter/herbal or prescription medications. 5. If you miss a dose of your medication, take the missed dose as soon as you remember. Return to your regular time/dose schedule the next day.  6.  Do not stop taking your medications without first talking with your healthcare provider. 7.  You may take Tylenol (acetaminophen), as long as the dose is less than 2000 mg (OR no more than 4 tablets of the Tylenol Extra Strengths 500mg  tablet) in 24 hours. 8.  You will need to obtain routine labs and/or office visits at [facility] at weeks 2, 4, 8,  and 12 as well as 12 and 24 weeks after completion of treatment.   Scharlene Gloss, [place] for [place], [place]  [id number] [phone]

## 2014-09-27 ENCOUNTER — Other Ambulatory Visit: Payer: Self-pay | Admitting: *Deleted

## 2014-09-27 MED ORDER — SOFOSBUVIR 400 MG PO TABS: 1.0000 | ORAL_TABLET | Freq: Every day | ORAL | Status: DC

## 2014-09-27 MED ORDER — RIBAVIRIN 200 MG PO CAPS: 1000.0000 mg | ORAL_CAPSULE | Freq: Two times a day (BID) | ORAL | Status: DC

## 2014-10-01 ENCOUNTER — Ambulatory Visit: Payer: BC Managed Care – PPO | Admitting: Physical Therapy

## 2014-10-01 DIAGNOSIS — I890 Lymphedema, not elsewhere classified: Secondary | ICD-10-CM
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
## 2014-10-03 ENCOUNTER — Ambulatory Visit: Payer: BC Managed Care – PPO | Admitting: Physical Therapy

## 2014-10-03 ENCOUNTER — Ambulatory Visit (INDEPENDENT_AMBULATORY_CARE_PROVIDER_SITE_OTHER): Payer: BLUE CROSS/BLUE SHIELD | Admitting: *Deleted

## 2014-10-03 DIAGNOSIS — I890 Lymphedema, not elsewhere classified: Secondary | ICD-10-CM | POA: Diagnosis not present

## 2014-10-03 DIAGNOSIS — B20 Human immunodeficiency virus [HIV] disease: Secondary | ICD-10-CM

## 2014-10-03 DIAGNOSIS — Z23 Encounter for immunization: Secondary | ICD-10-CM | POA: Diagnosis not present
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
## 2014-10-04 ENCOUNTER — Ambulatory Visit (HOSPITAL_BASED_OUTPATIENT_CLINIC_OR_DEPARTMENT_OTHER): Payer: BC Managed Care – PPO | Admitting: Hematology and Oncology

## 2014-10-04 ENCOUNTER — Encounter: Payer: Self-pay | Admitting: Hematology and Oncology

## 2014-10-04 ENCOUNTER — Telehealth: Payer: Self-pay | Admitting: Hematology and Oncology

## 2014-10-04 VITALS — BP 119/62 | HR 74 | Temp 98.0°F | Resp 18 | Ht 68.0 in | Wt 164.4 lb

## 2014-10-04 DIAGNOSIS — Z09 Encounter for follow-up examination after completed treatment for conditions other than malignant neoplasm: Secondary | ICD-10-CM

## 2014-10-04 DIAGNOSIS — C099 Malignant neoplasm of tonsil, unspecified: Secondary | ICD-10-CM

## 2014-10-04 DIAGNOSIS — I890 Lymphedema, not elsewhere classified: Secondary | ICD-10-CM

## 2014-10-04 DIAGNOSIS — B182 Chronic viral hepatitis C: Secondary | ICD-10-CM

## 2014-10-04 NOTE — Assessment & Plan Note (Signed)
His feeding tube looks okay with no signs of infection. We will get the feeding tube removed.

## 2014-10-04 NOTE — Assessment & Plan Note (Signed)
He is being followed by infectious disease service and he start on anti-viral medication. I would defer to them for further treatment.

## 2014-10-04 NOTE — Assessment & Plan Note (Signed)
This is related to side effects of radiation treatment. I recommend physical therapy referral and he is being followed with recommendation for neck exercises.

## 2014-10-04 NOTE — Assessment & Plan Note (Signed)
He is doing well and most of his side effects have resolved. PET CT scan has been ordered for next month and radiation oncologist will review this result with the patient. He desired to have his feeding tube removed and I will consult general surgery to have his feeding tube removed first and port later after we have results of PET CT scan.

## 2014-10-04 NOTE — Progress Notes (Signed)
[place] OFFICE PROGRESS NOTE  Patient Care Team: Gavin Pound, MD as PCP - General (Family Medicine) Brooks Sailors, RN as Oncology Nurse Navigator (Oncology)  SUMMARY OF ONCOLOGIC HISTORY: Oncology History   Tonsil cancer, Right, HPV positive,    Primary site: Pharynx - Oropharynx   Staging method: AJCC 7th Edition   Clinical: Stage IVA (T1, N2, M0) signed by Heath Lark, MD on 04/17/2014  1:04 PM   Summary: Stage IVA (T1, N2, M0)       Tonsil cancer   03/20/2014 Imaging Ct scan of neck showed several complex solid and cystic lesions within the right neck and abnormalities in the pharynx   03/25/2014 Procedure 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 Multiple hypermetabolic right level 2 lymph nodes compatible with metastatic adenopathy   05/10/2014 Surgery The patient has placement of port and feeding tube.   05/15/2014 - 06/11/2014 Chemotherapy He received high dose cisplatin every 3 weeks. He only received 2 doses due to severe liver function test abnormalities.   05/15/2014 - 07/04/2014 Radiation Therapy He received radiation treatment   06/05/2014 Adverse Reaction Cycle 2 of chemotherapy is delayed due to neutropenia & cycle 3 was abondoned due to severe hepatitis    INTERVAL HISTORY: Please see below for problem oriented charting. He feels well. He is maintaining his weight. He has not used his feeding tube for a month. He is setting some neck exercises for lymphedema. He is prescribed antiviral treatment for hepatitis C starting today.  REVIEW OF SYSTEMS:   Constitutional: Denies fevers, chills or abnormal weight loss Eyes: Denies blurriness of vision Ears, nose, mouth, throat, and face: Denies mucositis or sore throat Respiratory: Denies cough, dyspnea or  wheezes Cardiovascular: Denies palpitation, chest discomfort or lower extremity swelling Gastrointestinal:  Denies nausea, heartburn or change in bowel habits Skin: Denies abnormal skin rashes Lymphatics: Denies new lymphadenopathy or easy bruising Neurological:Denies numbness, tingling or new weaknesses Behavioral/Psych: Mood is stable, no new changes  All other systems were reviewed with the patient and are negative.  I have reviewed the past medical history, past surgical history, social history and family history with the patient and they are unchanged from previous note.  ALLERGIES:  is allergic to sulfa antibiotics.  MEDICATIONS:  Current Outpatient Prescriptions  Medication Sig Dispense Refill  . Alum & Mag Hydroxide-Simeth (MAGIC MOUTHWASH W/LIDOCAINE) SOLN Take 10 mLs by mouth 4 (four) times daily as needed for mouth pain.    Marland Kitchen emollient (BIAFINE) cream Apply 1 application topically 3 (three) times daily.    Marland Kitchen lidocaine-prilocaine (EMLA) cream Apply 1 application topically as needed. Apply to [facility] a Cath site one hour prior to needle stick. 30 g 3  . morphine (ROXANOL) 20 MG/ML concentrated solution Take 1 mL (20 mg total) by mouth every 2 (two) hours as needed for severe pain. 120 mL 0  . polyethylene glycol (MIRALAX / GLYCOLAX) packet Take 17 g by mouth daily as needed (for constipation).    . ribavirin (REBETOL) 200 MG capsule Take 5 capsules (1,000 mg total) by mouth 2 (two) times daily. Take 600 mg in the am and 400 mg in the pm 140 capsule 2  . sodium fluoride (FLUORISHIELD) 1.1 % GEL dental gel Instill one drop of fluoride per tooth space of fluoride tray. Place over teeth for 5  minutes. Remove. Spit out excess. Repeat nightly. 120 mL prn  . Sofosbuvir 400 MG TABS Take 1 tablet by mouth daily. 28 tablet 2   No current facility-administered medications for this visit.    PHYSICAL EXAMINATION: ECOG PERFORMANCE STATUS: 0 - Asymptomatic  Filed Vitals:   10/04/14 1231  BP:  119/62  Pulse: 74  Temp: 98 F (36.7 C)  Resp: 18   Filed Weights   10/04/14 1231  Weight: 164 lb 6.4 oz (74.571 kg)    GENERAL:alert, no distress and comfortable SKIN: skin color, texture, turgor are normal, no rashes or significant lesions EYES: normal, Conjunctiva are pink and non-injected, sclera clear OROPHARYNX:no exudate, no erythema and lips, buccal mucosa, and tongue normal  NECK: supple, thyroid normal size, non-tender, without nodularity. Lymphedema is noted NEURO: alert & oriented x 3 with fluent speech, no focal motor/sensory deficits  LABORATORY DATA:  I have reviewed the data as listed    Component Value Date/Time   NA 138 09/03/2014 1216   NA 129* 06/25/2014 0125   K 4.1 09/03/2014 1216   K 4.4 06/25/2014 0125   CL 93* 06/25/2014 0125   CO2 26 09/03/2014 1216   CO2 26 06/25/2014 0125   GLUCOSE 107 09/03/2014 1216   GLUCOSE 128* 06/25/2014 0125   BUN 17.0 09/03/2014 1216   BUN 13 06/25/2014 0125   CREATININE 0.8 09/03/2014 1216   CREATININE 0.95 06/25/2014 0125   CALCIUM 9.5 09/03/2014 1216   CALCIUM 8.6 06/25/2014 0125   PROT 7.0 09/03/2014 1216   PROT 6.4 06/25/2014 0125   ALBUMIN 3.2* 09/03/2014 1216   ALBUMIN 2.9* 06/25/2014 0125   AST 284* 09/03/2014 1216   AST 739* 06/25/2014 0125   ALT 430* 09/03/2014 1216   ALT 1023* 06/25/2014 0125   ALKPHOS 50 09/03/2014 1216   ALKPHOS 62 06/25/2014 0125   BILITOT 0.47 09/03/2014 1216   BILITOT 1.3* 06/25/2014 0125   GFRNONAA >90 06/25/2014 0125   GFRAA >90 06/25/2014 0125    No results found for: SPEP, UPEP  Lab Results  Component Value Date   WBC 5.0 09/03/2014   NEUTROABS 2.1 09/03/2014   HGB 13.2 09/03/2014   HCT 40.4 09/03/2014   MCV 96.5 09/03/2014   PLT 166 09/03/2014      Chemistry      Component Value Date/Time   NA 138 09/03/2014 1216   NA 129* 06/25/2014 0125   K 4.1 09/03/2014 1216   K 4.4 06/25/2014 0125   CL 93* 06/25/2014 0125   CO2 26 09/03/2014 1216   CO2 26 06/25/2014  0125   BUN 17.0 09/03/2014 1216   BUN 13 06/25/2014 0125   CREATININE 0.8 09/03/2014 1216   CREATININE 0.95 06/25/2014 0125      Component Value Date/Time   CALCIUM 9.5 09/03/2014 1216   CALCIUM 8.6 06/25/2014 0125   ALKPHOS 50 09/03/2014 1216   ALKPHOS 62 06/25/2014 0125   AST 284* 09/03/2014 1216   AST 739* 06/25/2014 0125   ALT 430* 09/03/2014 1216   ALT 1023* 06/25/2014 0125   BILITOT 0.47 09/03/2014 1216   BILITOT 1.3* 06/25/2014 0125      ASSESSMENT & PLAN:  Tonsil cancer He is doing well and most of his side effects have resolved. PET CT scan has been ordered for next month and radiation oncologist will review this result with the patient. He desired to have his feeding tube removed and I will consult general surgery to have his feeding tube removed first and port  later after we have results of PET CT scan.  Chronic hepatitis C without hepatic coma He is being followed by infectious disease service and he start on anti-viral medication. I would defer to them for further treatment.  Lymphedema This is related to side effects of radiation treatment. I recommend physical therapy referral and he is being followed with recommendation for neck exercises.    S/P gastrostomy tube (G tube) placement, follow-up exam His feeding tube looks okay with no signs of infection. We will get the feeding tube removed.      Orders Placed This Encounter  Procedures  . Ambulatory referral to General Surgery    Referral Priority:  Routine    Referral Type:  Surgical    Referral Reason:  Specialty Services Required    Referred to Provider:  Pedro Earls, MD    Requested Specialty:  General Surgery    Number of Visits Requested:  1   All questions were answered. The patient knows to call the clinic with any problems, questions or concerns. No barriers to learning was detected. I spent 25 minutes counseling the patient face to face. The total time spent in the appointment was 30  minutes and more than 50% was on counseling and review of test results     [facility], [place], MD 10/04/2014 1:08 PM

## 2014-10-04 NOTE — Telephone Encounter (Signed)
NO RTN VISIT W/NG PER 11/20 POF. VISIT W/DR Hassell Done 12/23 @ 10AM - ARRIVE @ 9:45AM. S/W Baker Janus - PT ALSO ON WAIT LIST FOR SOONER APPT.

## 2014-10-14 ENCOUNTER — Other Ambulatory Visit: Payer: BC Managed Care – PPO

## 2014-10-14 DIAGNOSIS — B182 Chronic viral hepatitis C: Secondary | ICD-10-CM

## 2014-10-14 LAB — CBC WITH DIFFERENTIAL/PLATELET
BASOS ABS: 0 10*3/uL (ref 0.0–0.1)
Basophils Relative: 0 % (ref 0–1)
EOS ABS: 0.3 10*3/uL (ref 0.0–0.7)
Eosinophils Relative: 5 % (ref 0–5)
HCT: 38 % — ABNORMAL LOW (ref 39.0–52.0)
Hemoglobin: 13.3 g/dL (ref 13.0–17.0)
Lymphocytes Relative: 37 % (ref 12–46)
Lymphs Abs: 2 10*3/uL (ref 0.7–4.0)
MCH: 31.4 pg (ref 26.0–34.0)
MCHC: 35 g/dL (ref 30.0–36.0)
MCV: 89.6 fL (ref 78.0–100.0)
MONO ABS: 0.8 10*3/uL (ref 0.1–1.0)
MPV: 10.8 fL (ref 9.4–12.4)
Monocytes Relative: 15 % — ABNORMAL HIGH (ref 3–12)
Neutro Abs: 2.3 10*3/uL (ref 1.7–7.7)
Neutrophils Relative %: 43 % (ref 43–77)
PLATELETS: 195 10*3/uL (ref 150–400)
RBC: 4.24 MIL/uL (ref 4.22–5.81)
RDW: 12.2 % (ref 11.5–15.5)
WBC: 5.3 10*3/uL (ref 4.0–10.5)

## 2014-10-14 LAB — COMPLETE METABOLIC PANEL WITH GFR
ALK PHOS: 36 U/L — AB (ref 39–117)
ALT: 19 U/L (ref 0–53)
AST: 20 U/L (ref 0–37)
Albumin: 3.7 g/dL (ref 3.5–5.2)
BUN: 13 mg/dL (ref 6–23)
CO2: 30 mEq/L (ref 19–32)
CREATININE: 0.9 mg/dL (ref 0.50–1.35)
Calcium: 9.4 mg/dL (ref 8.4–10.5)
Chloride: 103 mEq/L (ref 96–112)
GFR, Est African American: >89 mL/min
GFR, Est Non African American: >89 mL/min
GLUCOSE: 93 mg/dL (ref 70–99)
Potassium: 4.5 mEq/L (ref 3.5–5.3)
Sodium: 139 mEq/L (ref 135–145)
Total Bilirubin: 0.8 mg/dL (ref 0.2–1.2)
Total Protein: 6.9 g/dL (ref 6.0–8.3)

## 2014-10-15 ENCOUNTER — Ambulatory Visit: Payer: BC Managed Care – PPO | Attending: Radiation Oncology

## 2014-10-15 DIAGNOSIS — I890 Lymphedema, not elsewhere classified: Secondary | ICD-10-CM
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
## 2014-10-17 ENCOUNTER — Ambulatory Visit: Payer: BC Managed Care – PPO | Admitting: Physical Therapy

## 2014-10-17 DIAGNOSIS — I890 Lymphedema, not elsewhere classified: Secondary | ICD-10-CM
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
## 2014-10-22 ENCOUNTER — Ambulatory Visit: Payer: BC Managed Care – PPO

## 2014-10-22 DIAGNOSIS — I890 Lymphedema, not elsewhere classified: Secondary | ICD-10-CM
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
## 2014-10-24 ENCOUNTER — Ambulatory Visit: Payer: BC Managed Care – PPO

## 2014-10-29 ENCOUNTER — Encounter: Payer: Self-pay | Admitting: Internal Medicine

## 2014-10-29 ENCOUNTER — Ambulatory Visit (INDEPENDENT_AMBULATORY_CARE_PROVIDER_SITE_OTHER): Payer: BLUE CROSS/BLUE SHIELD | Admitting: Internal Medicine

## 2014-10-29 VITALS — BP 115/81 | HR 99 | Temp 98.1°F | Wt 162.0 lb

## 2014-10-29 DIAGNOSIS — B182 Chronic viral hepatitis C: Secondary | ICD-10-CM | POA: Diagnosis not present

## 2014-10-29 LAB — COMPLETE METABOLIC PANEL WITH GFR
ALT: 10 U/L (ref 0–53)
AST: 18 U/L (ref 0–37)
Albumin: 4 g/dL (ref 3.5–5.2)
Alkaline Phosphatase: 35 U/L — ABNORMAL LOW (ref 39–117)
BILIRUBIN TOTAL: 0.8 mg/dL (ref 0.2–1.2)
BUN: 15 mg/dL (ref 6–23)
CO2: 25 meq/L (ref 19–32)
CREATININE: 0.96 mg/dL (ref 0.50–1.35)
Calcium: 9.6 mg/dL (ref 8.4–10.5)
Chloride: 103 mEq/L (ref 96–112)
GFR, Est African American: >89 mL/min
GFR, Est Non African American: 88 mL/min
Glucose, Bld: 102 mg/dL — ABNORMAL HIGH (ref 70–99)
Potassium: 4 mEq/L (ref 3.5–5.3)
Sodium: 138 mEq/L (ref 135–145)
TOTAL PROTEIN: 7 g/dL (ref 6.0–8.3)

## 2014-10-29 LAB — CBC WITH DIFFERENTIAL/PLATELET
BASOS ABS: 0.1 10*3/uL (ref 0.0–0.1)
Basophils Relative: 1 % (ref 0–1)
EOS ABS: 0.2 10*3/uL (ref 0.0–0.7)
EOS PCT: 3 % (ref 0–5)
HCT: 36.3 % — ABNORMAL LOW (ref 39.0–52.0)
HEMOGLOBIN: 12.2 g/dL — AB (ref 13.0–17.0)
LYMPHS PCT: 40 % (ref 12–46)
Lymphs Abs: 2 10*3/uL (ref 0.7–4.0)
MCH: 30.7 pg (ref 26.0–34.0)
MCHC: 33.6 g/dL (ref 30.0–36.0)
MCV: 91.2 fL (ref 78.0–100.0)
MPV: 9.9 fL (ref 9.4–12.4)
Monocytes Absolute: 0.6 10*3/uL (ref 0.1–1.0)
Monocytes Relative: 11 % (ref 3–12)
Neutro Abs: 2.3 10*3/uL (ref 1.7–7.7)
Neutrophils Relative %: 45 % (ref 43–77)
PLATELETS: 231 10*3/uL (ref 150–400)
RBC: 3.98 MIL/uL — AB (ref 4.22–5.81)
RDW: 12.8 % (ref 11.5–15.5)
WBC: 5 10*3/uL (ref 4.0–10.5)

## 2014-10-29 NOTE — Assessment & Plan Note (Signed)
He is doing great with his medication with no missed doses and no issues with anemia 2 weeks after starting. He will get his labs today including CBC and viral load and will continue for his projected 12 weeks. He will come back in 4 weeks and I will again check a CBC though if there is a significant decrease today with his hemoglobin, I will have him return in 1-2 weeks. Labs reviewed and show resolution of his transaminitis.

## 2014-10-29 NOTE — Progress Notes (Signed)
   Subjective:    Patient ID: Christopher Mullins, male    DOB: June 04, 1956, 58 y.o.   MRN: [id number]  HPI Here for follow up of HCV, genotype 2.  Never treated.  Now approved for Solvaldi and ribavirin 1000 mg divided bid for 12 weeks and has completed about 4 weeks.   F2/3 on elastography.  Some weakness though also notes he is increasing his activity and exercise and did have radiation earlier this year.   Review of Systems  Constitutional: Negative for fatigue.  Gastrointestinal: Negative for diarrhea.  Skin: Negative for rash.  Neurological: Negative for dizziness, light-headedness and headaches.       Objective:   Physical Exam  Constitutional: He appears well-developed and well-nourished. No distress.  HENT:  Mouth/Throat: No oropharyngeal exudate.  Eyes: No scleral icterus.  Cardiovascular: Normal rate, regular rhythm and normal heart sounds.           Assessment & Plan:

## 2014-10-30 LAB — HEPATITIS C RNA QUANTITATIVE: HCV Quantitative: NOT DETECTED IU/mL (ref <15)

## 2014-10-31 ENCOUNTER — Ambulatory Visit (HOSPITAL_COMMUNITY): Payer: Self-pay

## 2014-11-01 ENCOUNTER — Ambulatory Visit
Admission: RE | Admit: 2014-11-01 | Discharge: 2014-11-01 | Disposition: A | Discharge status: Home / Self Care | Payer: BC Managed Care – PPO | Source: Ambulatory Visit | Attending: Radiation Oncology | Admitting: Radiation Oncology

## 2014-11-01 ENCOUNTER — Encounter: Payer: Self-pay | Admitting: Radiation Oncology

## 2014-11-01 ENCOUNTER — Ambulatory Visit (HOSPITAL_COMMUNITY)
Admission: RE | Admit: 2014-11-01 | Discharge: 2014-11-01 | Disposition: A | Discharge status: Home / Self Care | Payer: BC Managed Care – PPO | Source: Ambulatory Visit | Attending: Radiation Oncology | Admitting: Radiation Oncology

## 2014-11-01 VITALS — BP 127/78 | HR 92 | Temp 98.1°F | Ht 68.0 in | Wt 163.4 lb

## 2014-11-01 DIAGNOSIS — C099 Malignant neoplasm of tonsil, unspecified: Secondary | ICD-10-CM | POA: Diagnosis not present

## 2014-11-01 DIAGNOSIS — R634 Abnormal weight loss: Secondary | ICD-10-CM

## 2014-11-01 LAB — GLUCOSE, CAPILLARY: GLUCOSE-CAPILLARY: 96 mg/dL (ref 70–99)

## 2014-11-01 LAB — TSH CHCC: TSH: 1.123 m[IU]/L (ref 0.320–4.118)

## 2014-11-01 MED ORDER — FLUDEOXYGLUCOSE F - 18 (FDG) INJECTION
8.5000 | Freq: Once | INTRAVENOUS | Status: AC | PRN
  Administered 2014-11-01: 8.5 via INTRAVENOUS

## 2014-11-01 NOTE — Progress Notes (Addendum)
Christopher Mullins reports that he is eating without any difficulty or pain, but has to take sips of liquids when eating, due to dry mouth.  His oral mucosa is moist, pink and intact.  Working full-time.

## 2014-11-01 NOTE — Progress Notes (Signed)
Radiation Oncology         [phone]) [phone] ________________________________  Name: Christopher Mullins MRN: [id number]  Date: 11/01/2014  DOB: 31-Jan-1956  Follow-Up Visit Note  CC: Christopher Pound, MD  Christopher Marble, MD   REFERRING:  Christopher Marble, MD    HQI-6-NG ICD-10-CM   1. 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 Christopher Mullins reports that he is eating without any difficulty or pain, but has to take sips of liquids when eating, due to dry mouth.  His oral mucosa is moist, pink and intact.  Working full-time.     Receiving Ribavirin for Hep C with good effect.    Tolerating it well. PET shows complete metabolic response.               ALLERGIES:  is allergic to sulfa antibiotics.  Meds: Current Outpatient Prescriptions  Medication Sig Dispense Refill  . lidocaine-prilocaine (EMLA) cream Apply 1 application topically as needed. Apply to [facility] a Cath site one hour prior to needle stick. 30 g 3  . ribavirin (REBETOL) 200 MG capsule Take 5 capsules (1,000 mg total) by mouth 2 (two) times daily. Take 600 mg in the am and 400 mg in the pm 140 capsule 2  . sodium fluoride (FLUORISHIELD) 1.1 % GEL dental gel Instill one drop of fluoride per tooth space of fluoride tray. Place over teeth for 5 minutes. Remove. Spit out excess. Repeat nightly. 120 mL prn  . Sofosbuvir 400 MG TABS Take 1 tablet by mouth daily. 28 tablet 2   No current facility-administered medications for this encounter.     Physical Findings: The patient is in no acute distress. Patient is alert and oriented.  height is 5\' 8"  (1.727 m) and weight is 163 lb 6.4 oz (74.118 kg). His temperature is 98.1 F (36.7 C). His blood pressure is 127/78 and his pulse is 92. .   Oropharyngeal mucosa clear. No tumor or thrush. No  cervical or supraclavicular lymphadenopathy.  Skin slightly dry.  Mild neck lymphedema.    Lab Findings: Lab Results  Component Value Date   WBC 5.0 10/29/2014   HGB 12.2* 10/29/2014   HCT 36.3* 10/29/2014   MCV 91.2 10/29/2014   PLT 231 10/29/2014   CMP     Component Value Date/Time   NA 138 10/29/2014 1043   NA 138 09/03/2014 1216   K 4.0 10/29/2014 1043   K 4.1 09/03/2014 1216   CL 103 10/29/2014 1043   CO2 25 10/29/2014 1043   CO2 26 09/03/2014 1216   GLUCOSE 102* 10/29/2014 1043   GLUCOSE 107 09/03/2014 1216   BUN 15 10/29/2014 1043   BUN 17.0 09/03/2014 1216   CREATININE 0.96 10/29/2014 1043   CREATININE 0.8 09/03/2014 1216   CREATININE 0.95 06/25/2014 0125   CALCIUM 9.6 10/29/2014 1043   CALCIUM 9.5 09/03/2014 1216   PROT 7.0 10/29/2014 1043   PROT 7.0 09/03/2014 1216   ALBUMIN 4.0 10/29/2014 1043   ALBUMIN 3.2* 09/03/2014 1216  AST 18 10/29/2014 1043   AST 284* 09/03/2014 1216   ALT 10 10/29/2014 1043   ALT 430* 09/03/2014 1216   ALKPHOS 35* 10/29/2014 1043   ALKPHOS 50 09/03/2014 1216   BILITOT 0.8 10/29/2014 1043   BILITOT 0.47 09/03/2014 1216   GFRNONAA 88 10/29/2014 1043   GFRNONAA >90 06/25/2014 0125   GFRAA >89 10/29/2014 1043   GFRAA >90 06/25/2014 0125      Lab Results  Component Value Date   TSH 1.123 11/01/2014    Radiographic Findings: Nm Pet Image Restag (ps) Skull Base To Thigh  11/01/2014   CLINICAL DATA:  Subsequent treatment strategy for head/neck cancer.  EXAM: NUCLEAR MEDICINE PET SKULL BASE TO THIGH  TECHNIQUE: 8.5 mCi F-18 FDG was injected intravenously. Full-ring PET imaging was performed from the skull base to thigh after the  radiotracer. CT data was obtained and used for attenuation correction and anatomic localization.  FASTING BLOOD GLUCOSE:  Value: 162 mg/dl  COMPARISON:  PET-CT dated 04/15/2014.  CT neck dated 03/20/2014.  FINDINGS: NECK  Prior hypermetabolism in the right tonsillar region has resolved.  No hypermetabolic lymph nodes in the neck.  CHEST  No suspicious pulmonary nodules on the CT scan.  No hypermetabolic mediastinal or hilar nodes.  Left chest port terminates the cavoatrial junction.  ABDOMEN/PELVIS  No abnormal hypermetabolic activity within the liver, pancreas, adrenal glands, or spleen.  No hypermetabolic lymph nodes in the abdomen or pelvis.  Gastrostomy tube in satisfactory position. Associated mild hypermetabolism along the gastrostomy tract is likely reactive.  Atherosclerotic calcifications of the aortic arch and branch vessels.  Bladder is mildly thick-walled although underdistended.  SKELETON  No focal hypermetabolic activity to suggest skeletal metastasis.  IMPRESSION: Complete metabolic response.   Electronically Signed   By: Julian Hy M.D.   On: 11/01/2014 09:14      Impression/Plan:    1) Head and Neck Cancer Status: NED  2) Nutritional Status: doing well - PEG tube: not using, scheduled to be removed  3) Risk Factors: The patient has been educated about risk factors including alcohol and tobacco abuse; they understand that avoidance of alcohol and tobacco is important to prevent recurrences as well as other cancers  4) Swallowing: no issues  5) Dental: Encouraged to continue regular followup with dentistry, and dental hygiene including fluoride rinses.   6) Thyroid: TSH normal  Lab Results  Component Value Date   TSH 1.123 11/01/2014   7) Social: No active social issues to address at this time  8) Follow-up in 6 months with TSH. The patient was encouraged to call with any issues or questions before then.  Will refer back to ENT.   >25 minutes spent face to face today  with patient, >50% on counseling and coordination of care. _____________________________________   Eppie Gibson, MD

## 2014-11-04 ENCOUNTER — Telehealth: Payer: Self-pay | Admitting: *Deleted

## 2014-11-04 NOTE — Telephone Encounter (Signed)
CALLED PATIENT TO INFORM OF LAB ON 04-30-15 @ 10:30 AM, SPOKE WITH PATIENT'S WIFE, ANNETTE AND SHE IS AWARE OF THIS LAB.

## 2014-11-05 ENCOUNTER — Telehealth: Payer: Self-pay | Admitting: *Deleted

## 2014-11-05 NOTE — Telephone Encounter (Signed)
Spoke with Anderson Malta at [facility].  Requested per Dr. Isidore Moos that patient be seen by Dr. Erik Obey for 3 mo f/u.  She verbalized understanding, indicated she will contact patient to schedule.  Gayleen Orem, RN, BSN, [place] [facility] [place] [phone]

## 2014-11-06 ENCOUNTER — Ambulatory Visit (INDEPENDENT_AMBULATORY_CARE_PROVIDER_SITE_OTHER): Payer: Self-pay | Admitting: Surgery
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
## 2014-11-11 ENCOUNTER — Ambulatory Visit: Payer: BC Managed Care – PPO

## 2014-11-11 DIAGNOSIS — I890 Lymphedema, not elsewhere classified: Secondary | ICD-10-CM | POA: Diagnosis not present
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
## 2014-11-19 ENCOUNTER — Encounter (HOSPITAL_BASED_OUTPATIENT_CLINIC_OR_DEPARTMENT_OTHER): Payer: Self-pay | Admitting: *Deleted

## 2014-11-21 ENCOUNTER — Ambulatory Visit: Payer: BLUE CROSS/BLUE SHIELD | Attending: Radiation Oncology

## 2014-11-21 DIAGNOSIS — I890 Lymphedema, not elsewhere classified: Secondary | ICD-10-CM | POA: Diagnosis not present
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
## 2014-11-26 ENCOUNTER — Encounter (HOSPITAL_BASED_OUTPATIENT_CLINIC_OR_DEPARTMENT_OTHER)
Admission: RE | Disposition: A | Discharge status: Home / Self Care | Payer: Self-pay | Source: Ambulatory Visit | Attending: Surgery

## 2014-11-26 ENCOUNTER — Encounter (HOSPITAL_BASED_OUTPATIENT_CLINIC_OR_DEPARTMENT_OTHER): Payer: Self-pay | Admitting: *Deleted

## 2014-11-26 ENCOUNTER — Ambulatory Visit (HOSPITAL_BASED_OUTPATIENT_CLINIC_OR_DEPARTMENT_OTHER): Payer: BLUE CROSS/BLUE SHIELD | Admitting: Anesthesiology

## 2014-11-26 ENCOUNTER — Encounter: Payer: Self-pay | Admitting: Internal Medicine

## 2014-11-26 ENCOUNTER — Ambulatory Visit (INDEPENDENT_AMBULATORY_CARE_PROVIDER_SITE_OTHER): Payer: BLUE CROSS/BLUE SHIELD | Admitting: Internal Medicine

## 2014-11-26 ENCOUNTER — Ambulatory Visit (HOSPITAL_BASED_OUTPATIENT_CLINIC_OR_DEPARTMENT_OTHER)
Admission: RE | Admit: 2014-11-26 | Discharge: 2014-11-26 | Disposition: A | Discharge status: Home / Self Care | Payer: BLUE CROSS/BLUE SHIELD | Source: Ambulatory Visit | Attending: Surgery | Admitting: Surgery

## 2014-11-26 VITALS — BP 103/67 | HR 63 | Temp 97.7°F | Wt 166.0 lb

## 2014-11-26 DIAGNOSIS — Z9221 Personal history of antineoplastic chemotherapy: Secondary | ICD-10-CM | POA: Insufficient documentation

## 2014-11-26 DIAGNOSIS — B182 Chronic viral hepatitis C: Secondary | ICD-10-CM | POA: Diagnosis not present

## 2014-11-26 DIAGNOSIS — L409 Psoriasis, unspecified: Secondary | ICD-10-CM | POA: Diagnosis not present

## 2014-11-26 DIAGNOSIS — Z21 Asymptomatic human immunodeficiency virus [HIV] infection status: Secondary | ICD-10-CM | POA: Insufficient documentation

## 2014-11-26 DIAGNOSIS — Z923 Personal history of irradiation: Secondary | ICD-10-CM | POA: Insufficient documentation

## 2014-11-26 DIAGNOSIS — C099 Malignant neoplasm of tonsil, unspecified: Secondary | ICD-10-CM | POA: Diagnosis not present

## 2014-11-26 DIAGNOSIS — Z452 Encounter for adjustment and management of vascular access device: Secondary | ICD-10-CM | POA: Diagnosis not present

## 2014-11-26 DIAGNOSIS — Z79899 Other long term (current) drug therapy: Secondary | ICD-10-CM | POA: Diagnosis not present

## 2014-11-26 HISTORY — PX: PORT-A-CATH REMOVAL: SHX5289

## 2014-11-26 LAB — POCT HEMOGLOBIN-HEMACUE: HEMOGLOBIN: 10.8 g/dL — AB (ref 13.0–17.0)

## 2014-11-26 SURGERY — REMOVAL PORT-A-CATH
Anesthesia: Monitor Anesthesia Care | Site: Chest

## 2014-11-26 MED ORDER — ACETAMINOPHEN 325 MG PO TABS: 650.0000 mg | ORAL_TABLET | ORAL | Status: DC | PRN

## 2014-11-26 MED ORDER — OXYCODONE-ACETAMINOPHEN 5-325 MG PO TABS
ORAL_TABLET | ORAL | Status: AC
  Filled 2014-11-26: qty 1

## 2014-11-26 MED ORDER — CHLORHEXIDINE GLUCONATE 4 % EX LIQD: 1.0000 "application " | Freq: Once | CUTANEOUS | Status: DC

## 2014-11-26 MED ORDER — FENTANYL CITRATE 0.05 MG/ML IJ SOLN: 50.0000 ug | INTRAMUSCULAR | Status: DC | PRN

## 2014-11-26 MED ORDER — MIDAZOLAM HCL 5 MG/5ML IJ SOLN
INTRAMUSCULAR | Status: DC | PRN
  Administered 2014-11-26: 2 mg via INTRAVENOUS

## 2014-11-26 MED ORDER — SODIUM CHLORIDE 0.9 % IV SOLN: 250.0000 mL | INTRAVENOUS | Status: DC | PRN

## 2014-11-26 MED ORDER — OXYCODONE HCL 5 MG PO TABS
5.0000 mg | ORAL_TABLET | ORAL | Status: DC | PRN
  Administered 2014-11-26: 5 mg via ORAL

## 2014-11-26 MED ORDER — RIBAVIRIN 200 MG PO CAPS: 600.0000 mg | ORAL_CAPSULE | Freq: Every day | ORAL | Status: DC

## 2014-11-26 MED ORDER — HYDROCODONE-ACETAMINOPHEN 5-325 MG PO TABS: 1.0000 | ORAL_TABLET | ORAL | Status: DC | PRN

## 2014-11-26 MED ORDER — OXYCODONE HCL 5 MG PO TABS
ORAL_TABLET | ORAL | Status: AC
  Filled 2014-11-26: qty 1

## 2014-11-26 MED ORDER — LIDOCAINE HCL 1 % IJ SOLN
INTRAMUSCULAR | Status: DC | PRN
  Administered 2014-11-26: 3 mL

## 2014-11-26 MED ORDER — MIDAZOLAM HCL 2 MG/2ML IJ SOLN: 1.0000 mg | INTRAMUSCULAR | Status: DC | PRN

## 2014-11-26 MED ORDER — SODIUM CHLORIDE 0.9 % IJ SOLN: 3.0000 mL | Freq: Two times a day (BID) | INTRAMUSCULAR | Status: DC

## 2014-11-26 MED ORDER — LACTATED RINGERS IV SOLN
INTRAVENOUS | Status: DC
  Administered 2014-11-26: 07:00:00 via INTRAVENOUS

## 2014-11-26 MED ORDER — ONDANSETRON HCL 4 MG/2ML IJ SOLN
INTRAMUSCULAR | Status: DC | PRN
  Administered 2014-11-26: 4 mg via INTRAVENOUS

## 2014-11-26 MED ORDER — CEFAZOLIN SODIUM-DEXTROSE 2-3 GM-% IV SOLR
INTRAVENOUS | Status: AC
  Filled 2014-11-26: qty 50

## 2014-11-26 MED ORDER — FENTANYL CITRATE 0.05 MG/ML IJ SOLN
25.0000 ug | INTRAMUSCULAR | Status: DC | PRN
  Administered 2014-11-26: 25 ug via INTRAVENOUS

## 2014-11-26 MED ORDER — FENTANYL CITRATE 0.05 MG/ML IJ SOLN
INTRAMUSCULAR | Status: AC
  Filled 2014-11-26: qty 6

## 2014-11-26 MED ORDER — ACETAMINOPHEN 650 MG RE SUPP: 650.0000 mg | RECTAL | Status: DC | PRN

## 2014-11-26 MED ORDER — SODIUM BICARBONATE 4 % IV SOLN
INTRAVENOUS | Status: AC
  Filled 2014-11-26: qty 5

## 2014-11-26 MED ORDER — CEFAZOLIN SODIUM-DEXTROSE 2-3 GM-% IV SOLR
2.0000 g | INTRAVENOUS | Status: AC
  Administered 2014-11-26: 2 g via INTRAVENOUS

## 2014-11-26 MED ORDER — PROPOFOL 10 MG/ML IV BOLUS
INTRAVENOUS | Status: DC | PRN
  Administered 2014-11-26 (×4): 20 mg via INTRAVENOUS

## 2014-11-26 MED ORDER — SODIUM CHLORIDE 0.9 % IJ SOLN: 3.0000 mL | INTRAMUSCULAR | Status: DC | PRN

## 2014-11-26 MED ORDER — ONDANSETRON HCL 4 MG/2ML IJ SOLN: 4.0000 mg | Freq: Once | INTRAMUSCULAR | Status: DC | PRN

## 2014-11-26 MED ORDER — HEPARIN SODIUM (PORCINE) 5000 UNIT/ML IJ SOLN
5000.0000 [IU] | Freq: Once | INTRAMUSCULAR | Status: AC
  Administered 2014-11-26: 5000 [IU] via SUBCUTANEOUS

## 2014-11-26 MED ORDER — LIDOCAINE HCL (PF) 1 % IJ SOLN
INTRAMUSCULAR | Status: AC
  Filled 2014-11-26: qty 30

## 2014-11-26 MED ORDER — FENTANYL CITRATE 0.05 MG/ML IJ SOLN
INTRAMUSCULAR | Status: DC | PRN
  Administered 2014-11-26: 100 ug via INTRAVENOUS

## 2014-11-26 MED ORDER — FENTANYL CITRATE 0.05 MG/ML IJ SOLN
INTRAMUSCULAR | Status: AC
  Filled 2014-11-26: qty 2

## 2014-11-26 MED ORDER — HEPARIN SODIUM (PORCINE) 5000 UNIT/ML IJ SOLN
INTRAMUSCULAR | Status: AC
  Filled 2014-11-26: qty 1

## 2014-11-26 MED ORDER — MIDAZOLAM HCL 2 MG/2ML IJ SOLN
INTRAMUSCULAR | Status: AC
  Filled 2014-11-26: qty 2

## 2014-11-26 SURGICAL SUPPLY — 36 items
BENZOIN TINCTURE PRP APPL 2/3 (GAUZE/BANDAGES/DRESSINGS) ×2 IMPLANT
BLADE SURG 15 STRL LF DISP TIS (BLADE) ×1 IMPLANT
BLADE SURG 15 STRL SS (BLADE) ×1
CANISTER SUCT 1200ML W/VALVE (MISCELLANEOUS) IMPLANT
CLEANER CAUTERY TIP 5X5 PAD (MISCELLANEOUS) IMPLANT
COVER BACK TABLE 60X90IN (DRAPES) ×2 IMPLANT
COVER MAYO STAND STRL (DRAPES) ×2 IMPLANT
DECANTER SPIKE VIAL GLASS SM (MISCELLANEOUS) IMPLANT
DRAPE PED LAPAROTOMY (DRAPES) ×2 IMPLANT
ELECT REM PT RETURN 9FT ADLT (ELECTROSURGICAL) ×2
ELECTRODE REM PT RTRN 9FT ADLT (ELECTROSURGICAL) ×1 IMPLANT
GLOVE BIO SURGEON STRL SZ8 (GLOVE) ×2 IMPLANT
GLOVE BIOGEL PI IND STRL 7.0 (GLOVE) ×1 IMPLANT
GLOVE BIOGEL PI INDICATOR 7.0 (GLOVE) ×1
GLOVE ECLIPSE 6.5 STRL STRAW (GLOVE) ×2 IMPLANT
GOWN STRL REUS W/ TWL LRG LVL3 (GOWN DISPOSABLE) ×1 IMPLANT
GOWN STRL REUS W/ TWL XL LVL3 (GOWN DISPOSABLE) ×1 IMPLANT
GOWN STRL REUS W/TWL LRG LVL3 (GOWN DISPOSABLE) ×1
GOWN STRL REUS W/TWL XL LVL3 (GOWN DISPOSABLE) ×1
LIQUID BAND (GAUZE/BANDAGES/DRESSINGS) ×2 IMPLANT
NEEDLE HYPO 25X1 1.5 SAFETY (NEEDLE) ×2 IMPLANT
PACK BASIN DAY SURGERY FS (CUSTOM PROCEDURE TRAY) ×2 IMPLANT
PAD CLEANER CAUTERY TIP 5X5 (MISCELLANEOUS)
PENCIL BUTTON HOLSTER BLD 10FT (ELECTRODE) ×2 IMPLANT
SLEEVE SCD COMPRESS KNEE MED (MISCELLANEOUS) ×2 IMPLANT
SPONGE GAUZE 4X4 12PLY STER LF (GAUZE/BANDAGES/DRESSINGS) ×2 IMPLANT
STRIP CLOSURE SKIN 1/2X4 (GAUZE/BANDAGES/DRESSINGS) ×2 IMPLANT
SUT VIC AB 4-0 SH 18 (SUTURE) ×2 IMPLANT
SUT VIC AB 5-0 P-3 18X BRD (SUTURE) IMPLANT
SUT VIC AB 5-0 P3 18 (SUTURE)
SYR BULB 3OZ (MISCELLANEOUS) ×2 IMPLANT
SYR CONTROL 10ML LL (SYRINGE) ×2 IMPLANT
TOWEL OR 17X24 6PK STRL BLUE (TOWEL DISPOSABLE) ×2 IMPLANT
TRAY DSU PREP LF (CUSTOM PROCEDURE TRAY) ×2 IMPLANT
TUBE CONNECTING 20X1/4 (TUBING) IMPLANT
YANKAUER SUCT BULB TIP NO VENT (SUCTIONS) IMPLANT

## 2014-11-26 NOTE — Assessment & Plan Note (Signed)
He continues to do well and I will dose reduce his ribavirin to 600 mg daily since he has a decrease of 3 g of hemoglobin. I will then check his labs at the end of treatment and see him after that.

## 2014-11-26 NOTE — Progress Notes (Signed)
   Subjective:    Patient ID: Christopher Mullins, male    DOB: September 01, 1956, 59 y.o.   MRN: [id number]  HPI Here for follow up of HCV, genotype 2.  Never treated.  Now approved for Solvaldi and ribavirin 1000 mg divided bid for 12 weeks and has completed about 6 weeks.   F2/3 on elastography.  Had his Port-A-Cath removed this a.m.  He has had about a 3 g drop in his hemoglobin from 13-10.8.  Review of Systems  Constitutional: Negative for fatigue.  Gastrointestinal: Negative for diarrhea.  Skin: Negative for rash.  Neurological: Negative for dizziness, light-headedness and headaches.       Objective:   Physical Exam  Constitutional: He appears well-developed and well-nourished. No distress.  HENT:  Mouth/Throat: No oropharyngeal exudate.  Eyes: No scleral icterus.  Cardiovascular: Normal rate, regular rhythm and normal heart sounds.           Assessment & Plan:

## 2014-11-26 NOTE — Anesthesia Postprocedure Evaluation (Signed)
  Anesthesia Post-op Note  Patient: Christopher Mullins  Procedure(s) Performed: Procedure(s): REMOVAL PORT-A-CATH (N/A)  Patient Location: PACU  Anesthesia Type: MAC   Level of Consciousness: awake, alert  and oriented  Airway and Oxygen Therapy: Patient Spontanous Breathing  Post-op Pain: mild  Post-op Assessment: Post-op Vital signs reviewed  Post-op Vital Signs: Reviewed  Last Vitals:  Filed Vitals:   11/26/14 0915  BP: 100/48  Pulse: 61  Temp:   Resp: 13    Complications: No apparent anesthesia complications

## 2014-11-26 NOTE — Interval H&P Note (Signed)
History and Physical Interval Note:  11/26/2014 7:32 AM  Christopher Mullins  has presented today for surgery, with the diagnosis of HX OF CANCER  The various methods of treatment have been discussed with the patient and family. After consideration of risks, benefits and other options for treatment, the patient has consented to  Procedure(s): REMOVAL PORT-A-CATH (N/A) as a surgical intervention .  The patient's history has been reviewed, patient examined, no change in status, stable for surgery.  I have reviewed the patient's chart and labs.  Questions were answered to the patient's satisfaction.     [name] B

## 2014-11-26 NOTE — Transfer of Care (Signed)
Immediate Anesthesia Transfer of Care Note  Patient: Christopher Mullins  Procedure(s) Performed: Procedure(s): REMOVAL PORT-A-CATH (N/A)  Patient Location: PACU  Anesthesia Type:MAC  Level of Consciousness: awake, alert  and oriented  Airway & Oxygen Therapy: Patient Spontanous Breathing and Patient connected to face mask oxygen  Post-op Assessment: Report given to PACU RN and Post -op Vital signs reviewed and stable  Post vital signs: Reviewed and stable  Complications: No apparent anesthesia complications

## 2014-11-26 NOTE — Op Note (Signed)
Surgeon: Kaylyn Lim, MD, FACS  Asst:  none  Anes:  Local 1 % lido with neut and MAC  Procedure: Explantation of left subclavian portacath  Diagnosis: History of treated tonsillar cancer  Complications: none  EBL:   minimal cc  Drains: none  Description of Procedure:  The patient was taken to OR 2 at [facility].  After anesthesia was administered and the patient was prepped a timeout was performed.  The old scar on the left chest was removed.  The incision went down to the port and the pseudocapsule was invaded and the tubing removed first.  The device was tilted and the single prolene suture removed.  The entire device was then removed.  The incision was closed with 2 and 4-0 vicryl and LiquiBan.    The patient tolerated the procedure well and was taken to the PACU in stable condition.     Matt B. Hassell Done, [place], [facility], [place]

## 2014-11-26 NOTE — Anesthesia Preprocedure Evaluation (Addendum)
Anesthesia Evaluation  Patient identified by MRN, date of birth, ID band Patient awake    Reviewed: Allergy & Precautions, NPO status , Patient's Chart, lab work & pertinent test results  Airway Mallampati: II  TM Distance: >3 FB Neck ROM: Full    Dental  (+) Teeth Intact, Dental Advisory Given   Pulmonary neg pulmonary ROS, former smoker,  breath sounds clear to auscultation        Cardiovascular negative cardio ROS  Rhythm:Regular Rate:Normal     Neuro/Psych    GI/Hepatic negative GI ROS, Neg liver ROS, (+) Hepatitis -, C  Endo/Other    Renal/GU      Musculoskeletal   Abdominal   Peds  Hematology   Anesthesia Other Findings   Reproductive/Obstetrics                            Anesthesia Physical Anesthesia Plan  ASA: III  Anesthesia Plan: MAC   Post-op Pain Management:    Induction: Intravenous  Airway Management Planned: Simple Face Mask  Additional Equipment:   Intra-op Plan:   Post-operative Plan:   Informed Consent: I have reviewed the patients History and Physical, chart, labs and discussed the procedure including the risks, benefits and alternatives for the proposed anesthesia with the patient or authorized representative who has indicated his/her understanding and acceptance.   Dental advisory given  Plan Discussed with: CRNA, Anesthesiologist and Surgeon  Anesthesia Plan Comments:        Anesthesia Quick Evaluation
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
## 2014-11-26 NOTE — Discharge Instructions (Signed)
May shower tonight.  Allow Liquiban dressing to peel off on its own.      Post Anesthesia Home Care Instructions  Activity: Get plenty of rest for the remainder of the day. A responsible adult should stay with you for 24 hours following the procedure.  For the next 24 hours, DO NOT: -Drive a car -Paediatric nurse -Drink alcoholic beverages -Take any medication unless instructed by your physician -Make any legal decisions or sign important papers.  Meals: Start with liquid foods such as gelatin or soup. Progress to regular foods as tolerated. Avoid greasy, spicy, heavy foods. If nausea and/or vomiting occur, drink only clear liquids until the nausea and/or vomiting subsides. Call your physician if vomiting continues.  Special Instructions/Symptoms: Your throat may feel dry or sore from the anesthesia or the breathing tube placed in your throat during surgery. If this causes discomfort, gargle with warm salt water. The discomfort should disappear within 24 hours.

## 2014-11-26 NOTE — Anesthesia Procedure Notes (Signed)
Procedure Name: MAC Date/Time: 11/26/2014 7:46 AM Performed by: Melynda Ripple D Pre-anesthesia Checklist: Patient identified, Timeout performed, Emergency Drugs available, Suction available and Patient being monitored Oxygen Delivery Method: Simple face mask Preoxygenation: Pre-oxygenation with 100% oxygen

## 2014-11-27 ENCOUNTER — Encounter (HOSPITAL_BASED_OUTPATIENT_CLINIC_OR_DEPARTMENT_OTHER): Payer: Self-pay | Admitting: Surgery

## 2014-11-27 NOTE — Addendum Note (Signed)
Addendum  created 11/27/14 8984 by Aldona Bar   Modules edited: Charges VN

## 2014-11-28 ENCOUNTER — Ambulatory Visit: Payer: BLUE CROSS/BLUE SHIELD

## 2014-11-28 DIAGNOSIS — I890 Lymphedema, not elsewhere classified: Secondary | ICD-10-CM | POA: Diagnosis not present
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
## 2014-12-05 ENCOUNTER — Ambulatory Visit: Payer: BLUE CROSS/BLUE SHIELD

## 2014-12-05 DIAGNOSIS — I890 Lymphedema, not elsewhere classified: Secondary | ICD-10-CM
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
## 2014-12-12 ENCOUNTER — Ambulatory Visit: Payer: BLUE CROSS/BLUE SHIELD

## 2014-12-16 IMAGING — US US ABDOMEN COMPLETE W/ ELASTOGRAPHY
1 series · 13 of 25 positions shown · non-contrast
Comparison: None.

CLINICAL DATA: Chronic hepatitis C, prior splenectomy



[Series 1: us abdomen complete w/ elastography · 0.18mm/px · 13 of 62 slices shown]
[im 1/62]
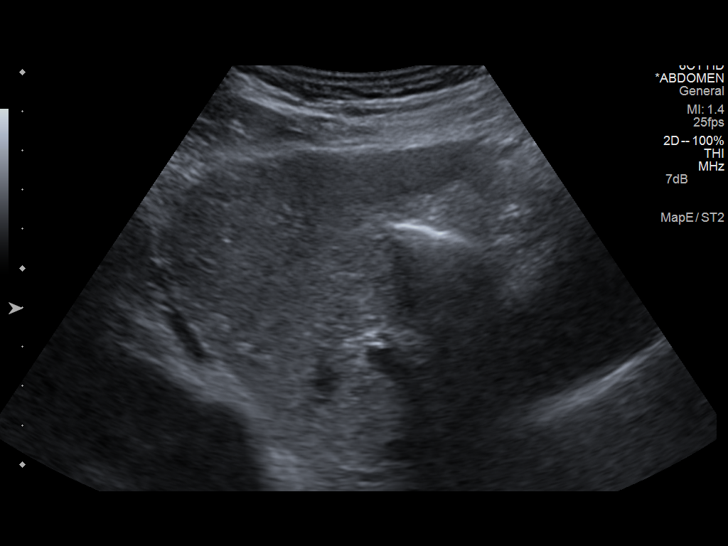
[im 6/62]
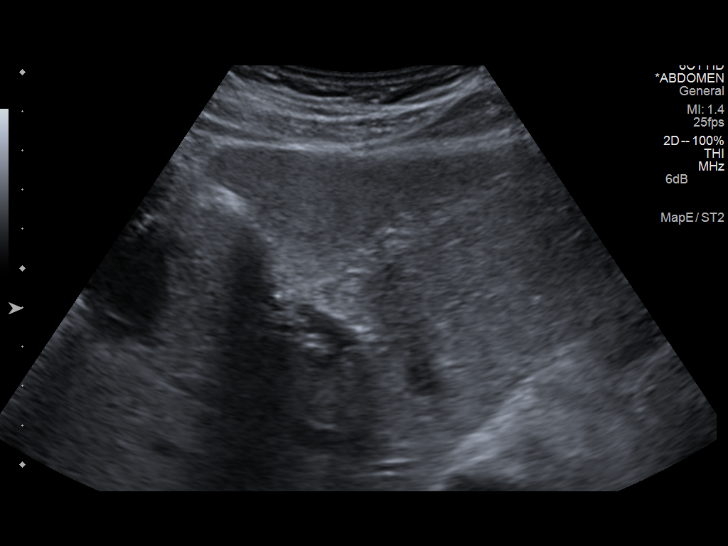
[im 11/62]
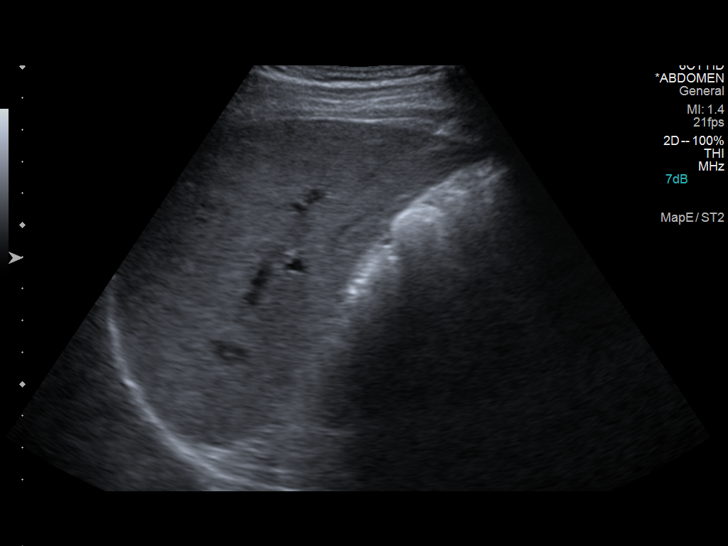
[im 16/62]
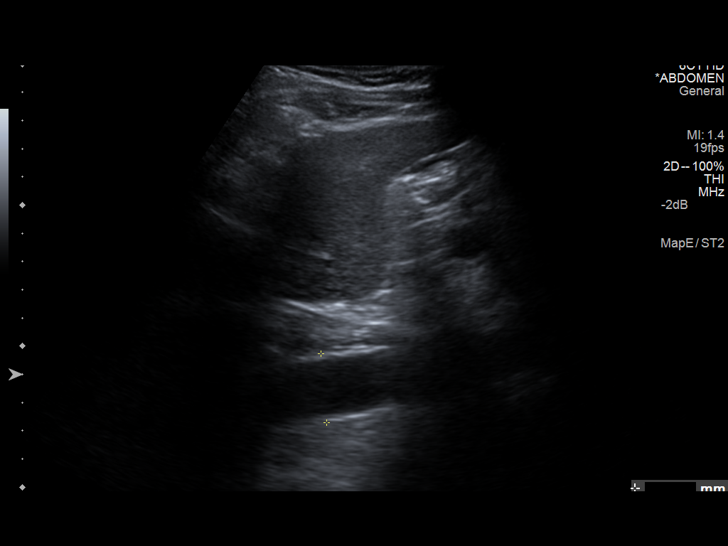
[im 21/62]
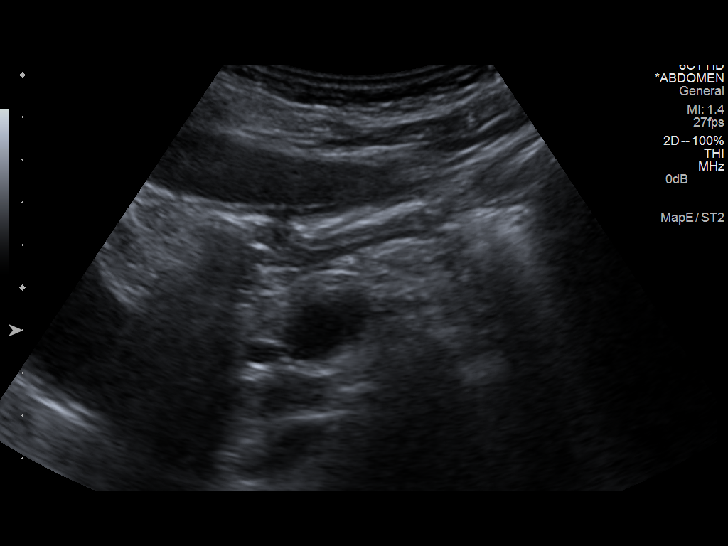
[im 26/62]
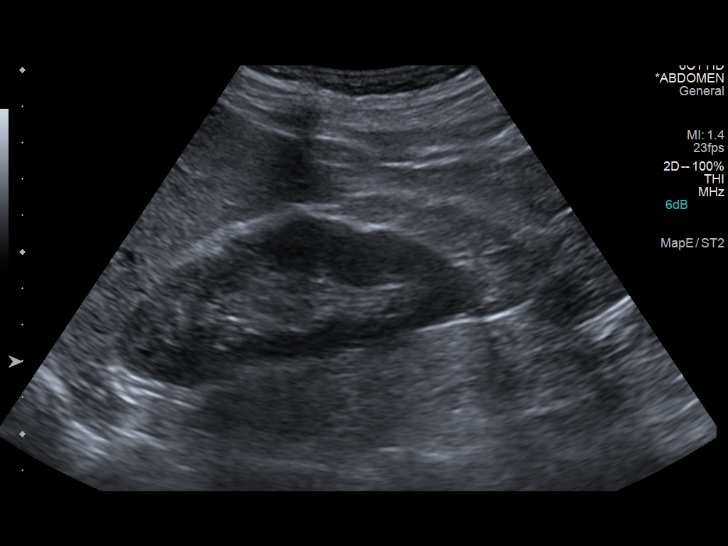
[im 31/62]
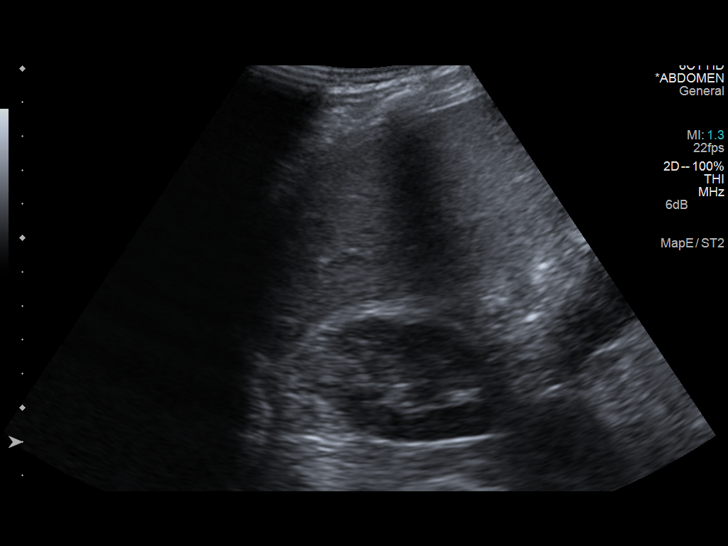
[im 36/62]
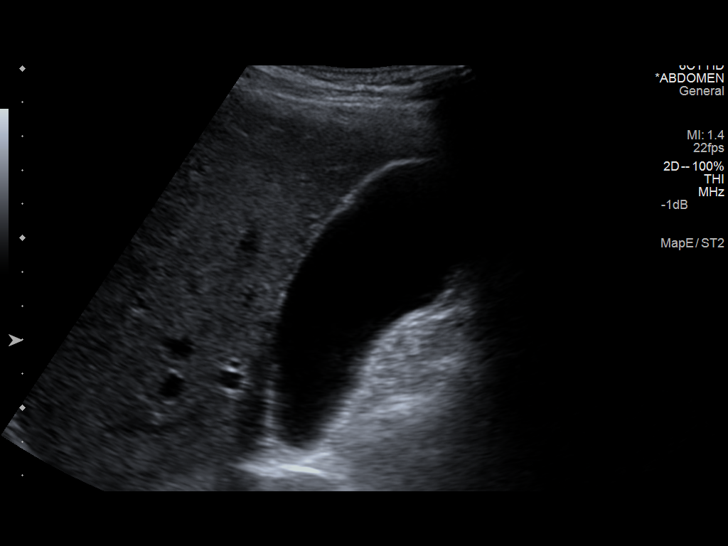
[im 41/62]
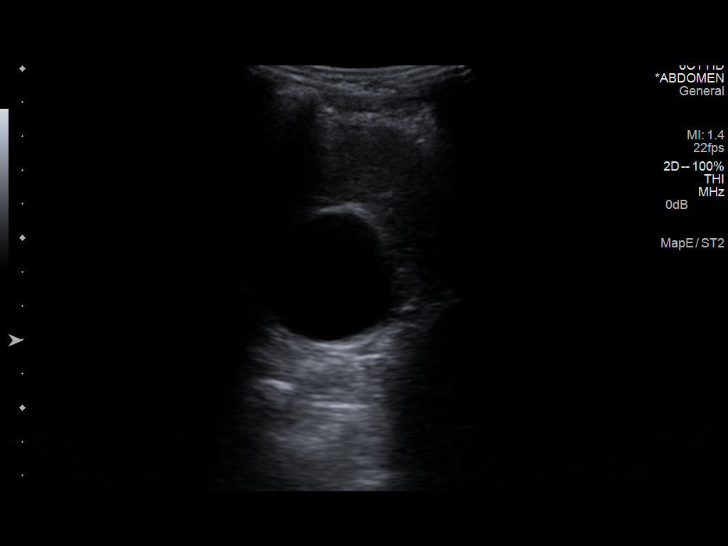
[im 46/62]
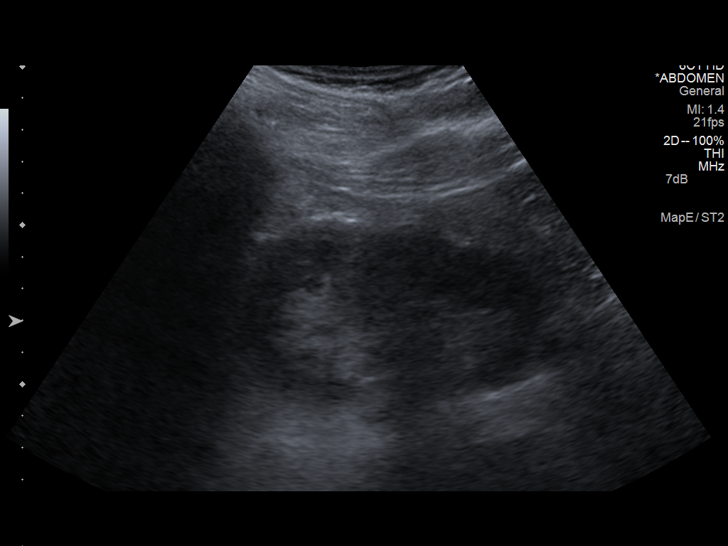
[im 51/62]
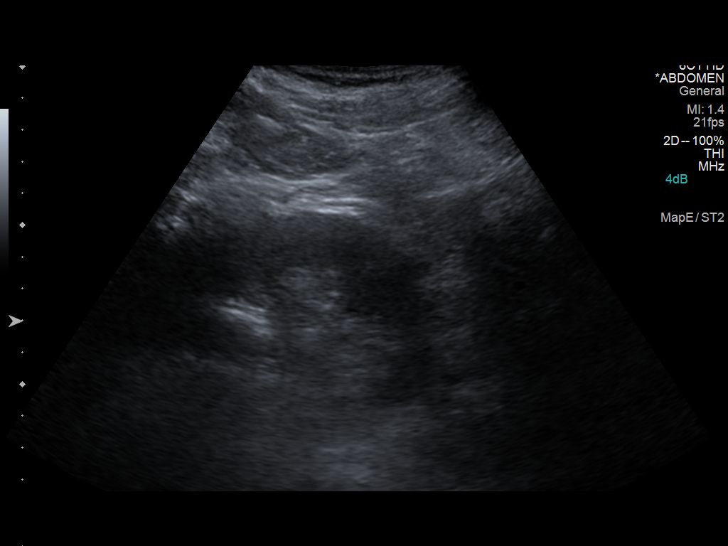
[im 56/62]
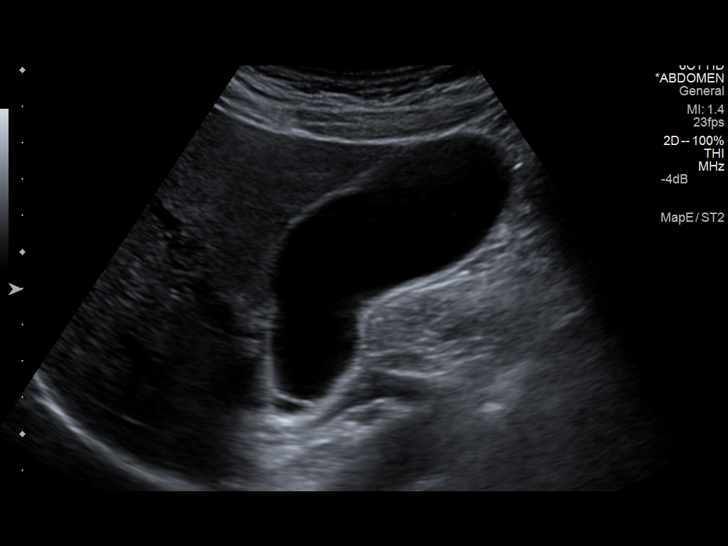
[im 62/62]
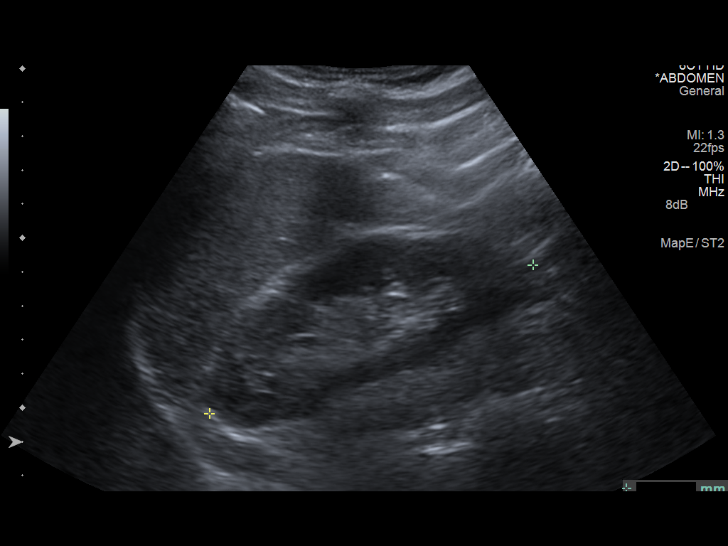

[13 of 25 positions shown; findings below may reference images not displayed]

FINDINGS: ULTRASOUND ABDOMEN

Gallbladder: No gallstones, gallbladder wall thickening, or
pericholecystic fluid. Negative sonographic Murphy's sign.

Common bile duct: Diameter: 4 mm.

Liver: Mildly nodular hepatic contour (image 2), raising the
possibility of early cirrhosis. No focal hepatic lesion is seen.

IVC: No abnormality visualized.

Pancreas: Poorly visualized due to overlying bowel gas.

Spleen: Surgically absent.

Right Kidney: Length: 10.5 cm. No mass or hydronephrosis.

Left Kidney: Length: 11.9 cm. No mass or hydronephrosis.

Abdominal aorta: No aneurysm visualized.

Other findings: None.

ULTRASOUND HEPATIC ELASTOGRAPHY

Device: Siemens Helix VTQ

Transducer 4V1

Patient position: Oblique

Number of measurements:  10

Hepatic Segment:  8

Median velocity:   1.21  m/sec

IQR:

IQR/Median velocity ratio

Corresponding Metavir fibrosis score:  F2/F3

Risk of fibrosis: Moderate

Limitations of exam: None

Pertinent findings noted on other imaging exams:  None

Please note that abnormal shear wave velocities may also be
identified in clinical settings other than with hepatic fibrosis,
such as: acute hepatitis, elevated right heart and central venous
pressures including use of beta blockers, Hidalgo disease
(Auntyjatty), infiltrative processes such as
mastocytosis/amyloidosis/infiltrative tumor, extrahepatic
cholestasis, in the post-prandial state, and liver transplantation.
Correlation with patient history, laboratory data, and clinical
condition recommended.
IMPRESSION: Mildly nodular hepatic contour, raising the possibility of early
cirrhosis.

Prior splenectomy.

Median hepatic shear wave velocity is calculated at 1.21 m/sec.

Corresponding Metavir fibrosis score is F2/F3.

Risk of fibrosis is moderate.

Follow-up:  Additional testing appropriate.

## 2014-12-19 ENCOUNTER — Ambulatory Visit: Payer: BLUE CROSS/BLUE SHIELD | Attending: Radiation Oncology

## 2014-12-19 DIAGNOSIS — I890 Lymphedema, not elsewhere classified: Secondary | ICD-10-CM | POA: Diagnosis present
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
## 2014-12-20 NOTE — Addendum Note (Signed)
Addended by: Kipp Laurence on: 12/20/2014 12:43 PM   Modules accepted: Orders

## 2014-12-26 ENCOUNTER — Ambulatory Visit: Payer: BLUE CROSS/BLUE SHIELD

## 2014-12-26 DIAGNOSIS — I890 Lymphedema, not elsewhere classified: Secondary | ICD-10-CM
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
## 2014-12-30 ENCOUNTER — Other Ambulatory Visit: Payer: Self-pay

## 2014-12-31 ENCOUNTER — Other Ambulatory Visit: Payer: Self-pay

## 2015-01-02 ENCOUNTER — Ambulatory Visit: Payer: BLUE CROSS/BLUE SHIELD

## 2015-01-02 ENCOUNTER — Other Ambulatory Visit: Payer: BLUE CROSS/BLUE SHIELD

## 2015-01-02 DIAGNOSIS — B182 Chronic viral hepatitis C: Secondary | ICD-10-CM

## 2015-01-02 DIAGNOSIS — I890 Lymphedema, not elsewhere classified: Secondary | ICD-10-CM | POA: Diagnosis not present

## 2015-01-02 LAB — CBC WITH DIFFERENTIAL/PLATELET
Basophils Absolute: 0.1 10*3/uL (ref 0.0–0.1)
Basophils Relative: 1 % (ref 0–1)
EOS ABS: 0.2 10*3/uL (ref 0.0–0.7)
Eosinophils Relative: 3 % (ref 0–5)
HEMATOCRIT: 35.8 % — AB (ref 39.0–52.0)
HEMOGLOBIN: 11.6 g/dL — AB (ref 13.0–17.0)
Lymphocytes Relative: 35 % (ref 12–46)
Lymphs Abs: 2.3 10*3/uL (ref 0.7–4.0)
MCH: 30.6 pg (ref 26.0–34.0)
MCHC: 32.4 g/dL (ref 30.0–36.0)
MCV: 94.5 fL (ref 78.0–100.0)
MONO ABS: 0.6 10*3/uL (ref 0.1–1.0)
MPV: 9.8 fL (ref 8.6–12.4)
Monocytes Relative: 9 % (ref 3–12)
Neutro Abs: 3.4 10*3/uL (ref 1.7–7.7)
Neutrophils Relative %: 52 % (ref 43–77)
Platelets: 252 10*3/uL (ref 150–400)
RBC: 3.79 MIL/uL — AB (ref 4.22–5.81)
RDW: 13.2 % (ref 11.5–15.5)
WBC: 6.5 10*3/uL (ref 4.0–10.5)
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
## 2015-01-03 LAB — HEPATITIS C RNA QUANTITATIVE: HCV Quantitative: NOT DETECTED IU/mL (ref <15)

## 2015-01-07 ENCOUNTER — Ambulatory Visit: Payer: Self-pay | Admitting: Internal Medicine

## 2015-01-09 ENCOUNTER — Ambulatory Visit: Payer: BLUE CROSS/BLUE SHIELD

## 2015-01-09 DIAGNOSIS — I890 Lymphedema, not elsewhere classified: Secondary | ICD-10-CM | POA: Diagnosis not present
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
## 2015-01-13 ENCOUNTER — Telehealth: Payer: Self-pay | Admitting: *Deleted

## 2015-01-13 NOTE — Telephone Encounter (Signed)
[facility] called asking if they should keep the patient's profile open.  He has 1 more month of ribivirin, but has completed 3 months of sofosbuvir.  RN advised that patient has follow up scheduled 3/8, asked to have his profile remain open until that visit.  Landis Gandy, RN

## 2015-01-14 ENCOUNTER — Ambulatory Visit: Payer: Self-pay | Admitting: Internal Medicine

## 2015-01-21 ENCOUNTER — Ambulatory Visit (INDEPENDENT_AMBULATORY_CARE_PROVIDER_SITE_OTHER): Payer: BLUE CROSS/BLUE SHIELD | Admitting: Internal Medicine

## 2015-01-21 VITALS — BP 128/75 | HR 76 | Temp 98.2°F | Ht 68.0 in | Wt 165.0 lb

## 2015-01-21 DIAGNOSIS — B182 Chronic viral hepatitis C: Secondary | ICD-10-CM | POA: Diagnosis not present

## 2015-01-21 NOTE — Progress Notes (Signed)
   Subjective:    Patient ID: Christopher Mullins, male    DOB: 04/22/1956, 59 y.o.   MRN: [id number]  HPI Here for follow up of HCV, genotype 2.  Never treated.  Now completed Solvaldi and ribavirin.   F2/3 on elastography.  He has had about a 3 g drop in his hemoglobin from 13-10.8, but remained stable and now back up to 11.6.  Exercising and keeping active.    Review of Systems  Constitutional: Negative for fatigue.  Gastrointestinal: Negative for diarrhea.  Skin: Negative for rash.  Neurological: Negative for dizziness, light-headedness and headaches.       Objective:   Physical Exam  Constitutional: He appears well-developed and well-nourished. No distress.  HENT:  Mouth/Throat: No oropharyngeal exudate.  Eyes: No scleral icterus.  Cardiovascular: Normal rate, regular rhythm and normal heart sounds.           Assessment & Plan:

## 2015-01-21 NOTE — Assessment & Plan Note (Signed)
Now completed treatment and end of treatment viral load remains undetectable. He will return again in 3 months for end of treatment SVR 12.  I will also consider a repeat elastography in about one year.

## 2015-01-29 IMAGING — PT NM PET TUM IMG RESTAG (PS) SKULL BASE T - THIGH
1 of 7 series · 1 of 25 positions shown · non-contrast
Comparison: PET-CT dated 04/15/2014.  CT neck dated 03/20/2014.

CLINICAL DATA: Subsequent treatment strategy for head/neck cancer.

EXAM:
NUCLEAR MEDICINE PET SKULL BASE TO THIGH
TECHNIQUE: 8.5 mCi F-18 FDG was injected intravenously. Full-ring PET imaging
was performed from the skull base to thigh after the radiotracer. CT
data was obtained and used for attenuation correction and anatomic
localization.
FASTING BLOOD GLUCOSE:  Value: 162 mg/dl

[Series 4: ct hn_sk_th 5.0 hd_fov · axial · 5.0mm · 1.12mm/px · 1 of 223 slices shown]
[im 223/223  brain]
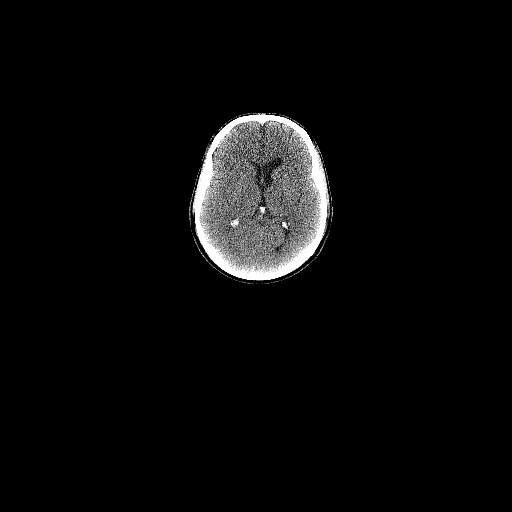

[1 of 25 positions shown; findings below may reference images not displayed]

FINDINGS: NECK

Prior hypermetabolism in the right tonsillar region has resolved.

No hypermetabolic lymph nodes in the neck.

CHEST

No suspicious pulmonary nodules on the CT scan.

No hypermetabolic mediastinal or hilar nodes.

Left chest port terminates the cavoatrial junction.

ABDOMEN/PELVIS

No abnormal hypermetabolic activity within the liver, pancreas,
adrenal glands, or spleen.

No hypermetabolic lymph nodes in the abdomen or pelvis.

Gastrostomy tube in satisfactory position. Associated mild
hypermetabolism along the gastrostomy tract is likely reactive.

Atherosclerotic calcifications of the aortic arch and branch
vessels.

Bladder is mildly thick-walled although underdistended.

SKELETON

No focal hypermetabolic activity to suggest skeletal metastasis.
IMPRESSION: Complete metabolic response.

## 2015-04-09 ENCOUNTER — Other Ambulatory Visit: Payer: BLUE CROSS/BLUE SHIELD

## 2015-04-09 DIAGNOSIS — B182 Chronic viral hepatitis C: Secondary | ICD-10-CM

## 2015-04-13 LAB — HEPATITIS C RNA QUANTITATIVE: HCV Quantitative: NOT DETECTED IU/mL (ref <15)

## 2015-04-16 ENCOUNTER — Ambulatory Visit: Payer: Self-pay | Admitting: Internal Medicine

## 2015-04-16 ENCOUNTER — Telehealth: Payer: Self-pay | Admitting: *Deleted

## 2015-04-16 NOTE — Telephone Encounter (Signed)
Patient had to cancel todays appt and per Dr.Comer he just needs to follow up in 6 months. He will order a repeat ultrasound at that time. He does need to schedule a nurse visit now for Hep A and B vaccines. Myrtis Hopping

## 2015-04-18 NOTE — Telephone Encounter (Signed)
Scheduled pt for injections on 04/23/15 prior to [place] appt.

## 2015-04-23 ENCOUNTER — Ambulatory Visit: Payer: BC Managed Care – PPO | Admitting: Radiation Oncology

## 2015-04-23 ENCOUNTER — Ambulatory Visit
Admission: RE | Admit: 2015-04-23 | Discharge: 2015-04-23 | Disposition: A | Discharge status: Home / Self Care | Payer: BLUE CROSS/BLUE SHIELD | Source: Ambulatory Visit | Attending: Radiation Oncology | Admitting: Radiation Oncology

## 2015-04-23 ENCOUNTER — Ambulatory Visit (INDEPENDENT_AMBULATORY_CARE_PROVIDER_SITE_OTHER): Payer: BLUE CROSS/BLUE SHIELD | Admitting: *Deleted

## 2015-04-23 ENCOUNTER — Other Ambulatory Visit: Payer: Self-pay

## 2015-04-23 ENCOUNTER — Encounter: Payer: Self-pay | Admitting: Radiation Oncology

## 2015-04-23 ENCOUNTER — Other Ambulatory Visit: Payer: Self-pay | Admitting: *Deleted

## 2015-04-23 ENCOUNTER — Ambulatory Visit
Admission: RE | Admit: 2015-04-23 | Payer: BLUE CROSS/BLUE SHIELD | Source: Ambulatory Visit | Admitting: Radiation Oncology

## 2015-04-23 ENCOUNTER — Encounter: Payer: Self-pay | Admitting: *Deleted

## 2015-04-23 VITALS — BP 133/88 | HR 57 | Temp 98.1°F | Resp 12 | Wt 166.9 lb

## 2015-04-23 DIAGNOSIS — C099 Malignant neoplasm of tonsil, unspecified: Secondary | ICD-10-CM

## 2015-04-23 DIAGNOSIS — Z23 Encounter for immunization: Secondary | ICD-10-CM

## 2015-04-23 DIAGNOSIS — R634 Abnormal weight loss: Secondary | ICD-10-CM | POA: Diagnosis not present

## 2015-04-23 LAB — TSH CHCC: TSH: 2.154 m(IU)/L (ref 0.320–4.118)

## 2015-04-23 MED ORDER — CLOBETASOL PROPIONATE 0.05 % EX CREA: TOPICAL_CREAM | CUTANEOUS | Status: AC

## 2015-04-23 NOTE — Progress Notes (Signed)
Received a message that there was an e prescribing issue with the Temovate cream.  [facility] and she stated the refill was received fine and has been filled without issue.

## 2015-04-23 NOTE — Progress Notes (Signed)
Radiation Oncology         [phone]) [phone] ________________________________  Name: Christopher Mullins MRN: [id number]  Date: 04/23/2015  DOB: 1956-01-21  Follow-Up Visit Note  CC: Gavin Pound, MD  Gavin Pound, MD  Diagnosis and Prior Radiotherapy:       ICD-9-CM ICD-10-CM   1. 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     He is currently in no pain.  Nutritional Status  PO Diet: Regular - weight stable   Swallowing Status: Pt denies dysphagia. Needs to have some thing to drink while eating. Reports taste changes as well, vinegar is bothersome.   Dental (if applicable): When was last visit with dentistry March 2016  Using fluoride trays daily? No   When was last ENT visit? March 2016  When is next ENT visit? September 2016  Other notable issues, if any: Dry mouth, denies swallowing difficulties or pain.  Reports having Biotene.  Reports having some lymphedema to neck area and has exercises to continue to do.    He reports a strange sensation in thighs -- only when flexing his neck while running.               ALLERGIES:  is allergic to sulfa antibiotics.  Meds: Current Outpatient Prescriptions  Medication Sig Dispense Refill  . clobetasol cream (TEMOVATE) 0.05 % Apply to skin as needed. 45 g 5   No current facility-administered medications for this encounter.    Physical Findings: The patient is in no acute distress.  Patient is alert and oriented. Wt Readings from Last 3 Encounters:  04/23/15 166 lb 14.4 oz (75.705 kg)  01/21/15 165 lb (74.844 kg)  11/26/14 166 lb (75.297 kg)    weight is 166 lb 14.4 oz (75.705 kg). His oral temperature is 98.1 F (36.7 C). His blood pressure is 133/88 and his pulse is 57. His respiration is 12 and oxygen saturation is 100%. .  General: Alert and oriented, in no acute distress HEENT: Head is normocephalic. Extraocular movements are intact. Oropharynx is notable for no lesions Neck: Neck is notable for no palpable adenopathy  Skin: Skin in treatment fields shows satisfactory healing  Heart: Regular in rate and rhythm with no murmurs, rubs, or gallops. Chest: Clear to auscultation bilaterally, with no rhonchi, wheezes, or rales. Abdomen: Soft, nontender, nondistended, with no rigidity or guarding. Extremities: No cyanosis or edema. Lymphatics: see Neck Exam Psychiatric: Judgment and insight are intact. Affect is appropriate.   Lab Findings: Lab Results  Component Value Date   WBC 6.5 01/02/2015   HGB 11.6* 01/02/2015   HCT 35.8* 01/02/2015   MCV 94.5 01/02/2015   PLT 252 01/02/2015    Lab Results  Component Value Date   TSH 2.154 04/23/2015    Radiographic Findings: No results found.  Impression/Plan:    1) Head and Neck Cancer Status: NED  2) Nutritional Status: no issues  3) Risk Factors: not using tobacco  4) Swallowing: no issues  5)  Dental: Encouraged to continue regular followup with dentistry, and dental hygiene including fluoride rinses. He will resume fluoride rinses  6) Thyroid function:  Lab Results  Component Value Date   TSH 2.154 04/23/2015    7) Other: sensation in thighs with neck flexion whiel running could be an atypical presentation of L'Hermitte's sign (which is benign)... or unrelated to his cancer treatments.  This is improving. He'll let me know if it doesn't.  8) I gave the patient a card to schedule follow-up in  January. The patient was encouraged to call with any issues or questions before then.  _____________________________________   Eppie Gibson, MD

## 2015-04-23 NOTE — Progress Notes (Signed)
Pain Status: He is currently in no pain.  Nutritional Status a) intake: PO Diet: Regular b) using a feeding tube?: removed c) weight changes, if any:  Wt Readings from Last 3 Encounters:  04/23/15 166 lb 14.4 oz (75.705 kg)  01/21/15 165 lb (74.844 kg)  11/26/14 166 lb (75.297 kg)    Swallowing Status: Pt denies dysphagia. Needs to have some thing to drink while eating. Reports taste changes as well, vinegar is bothersome.   Dental (if applicable): When was last visit with dentistry March 2016  Using fluoride trays daily? No   When was last ENT visit? March 2016  When is next ENT visit? September 2016  Other notable issues, if any: Dry mouth, denies swallowing difficulties or pain.  Reports having Biotene.  Reports having some lymphedema to neck area and has exercises to continue to do.

## 2015-04-24 ENCOUNTER — Telehealth: Payer: Self-pay | Admitting: Oncology

## 2015-04-24 NOTE — Telephone Encounter (Addendum)
[facility] regarding message that there was an error with sending the clobetasol prescription in [place] yesterday.  [facility], [facility] did [facility] the prescription and the patient has already picked it up.

## 2015-04-30 ENCOUNTER — Ambulatory Visit: Payer: BLUE CROSS/BLUE SHIELD

## 2015-04-30 ENCOUNTER — Ambulatory Visit: Payer: BLUE CROSS/BLUE SHIELD | Admitting: Radiation Oncology

## 2016-01-26 NOTE — Progress Notes (Signed)
error 

## 2016-01-27 NOTE — Progress Notes (Signed)
Mr. Kaczka presents for follow up of radiation completed 07/04/2014 to his Right Tonsil and bilateral neck.    Pain issues, if any? No Using a feeding tube?: No Weight changes, if any:  Wt Readings from Last 3 Encounters:  01/30/16 177 lb 12.8 oz (80.65 kg)  04/23/15 166 lb 14.4 oz (75.705 kg)  01/21/15 165 lb (74.844 kg)   Swallowing issues, if any: He reports he needs to swallow with water. He states he has no difficulty other wise and does not modify his diet.  Smoking or chewing tobacco? No Using fluoride trays daily? A couple times a week. Last ENT visit was on: [id number] with Dr. Erik Obey. He is alternating between between Dr. Isidore Moos and Dr. Erik Obey about every 3-4 months.  Other notable issues, if any: He is training for a half marathon in April with his children. He reports losing some weight with his training. He does state he has a dry mouth, he will occasionally use biotene and some toothpaste for dry mouth.   BP 132/81 mmHg  Pulse 66  Temp(Src) 97.8 F (36.6 C)  Wt 177 lb 12.8 oz (80.65 kg)  SpO2 100%

## 2016-01-28 ENCOUNTER — Inpatient Hospital Stay
Admission: RE | Admit: 2016-01-28 | Payer: BLUE CROSS/BLUE SHIELD | Source: Ambulatory Visit | Admitting: Radiation Oncology

## 2016-01-28 ENCOUNTER — Ambulatory Visit
Admission: RE | Admit: 2016-01-28 | Discharge: 2016-01-28 | Disposition: A | Discharge status: Home / Self Care | Payer: BLUE CROSS/BLUE SHIELD | Source: Ambulatory Visit | Attending: Radiation Oncology | Admitting: Radiation Oncology

## 2016-01-30 ENCOUNTER — Encounter: Payer: Self-pay | Admitting: Adult Health

## 2016-01-30 ENCOUNTER — Ambulatory Visit (HOSPITAL_BASED_OUTPATIENT_CLINIC_OR_DEPARTMENT_OTHER)
Admission: RE | Admit: 2016-01-30 | Discharge: 2016-01-30 | Disposition: A | Discharge status: Home / Self Care | Payer: BLUE CROSS/BLUE SHIELD | Source: Ambulatory Visit | Attending: Radiation Oncology | Admitting: Radiation Oncology

## 2016-01-30 ENCOUNTER — Encounter: Payer: Self-pay | Admitting: *Deleted

## 2016-01-30 ENCOUNTER — Telehealth: Payer: Self-pay | Admitting: *Deleted

## 2016-01-30 ENCOUNTER — Other Ambulatory Visit: Payer: Self-pay | Admitting: Adult Health

## 2016-01-30 ENCOUNTER — Ambulatory Visit
Admission: RE | Admit: 2016-01-30 | Discharge: 2016-01-30 | Disposition: A | Discharge status: Home / Self Care | Payer: BLUE CROSS/BLUE SHIELD | Source: Ambulatory Visit | Attending: Radiation Oncology | Admitting: Radiation Oncology

## 2016-01-30 ENCOUNTER — Encounter: Payer: Self-pay | Admitting: Radiation Oncology

## 2016-01-30 VITALS — BP 132/81 | HR 66 | Temp 97.8°F | Wt 177.8 lb

## 2016-01-30 DIAGNOSIS — C099 Malignant neoplasm of tonsil, unspecified: Secondary | ICD-10-CM

## 2016-01-30 DIAGNOSIS — C090 Malignant neoplasm of tonsillar fossa: Secondary | ICD-10-CM | POA: Insufficient documentation

## 2016-01-30 DIAGNOSIS — R5383 Other fatigue: Secondary | ICD-10-CM

## 2016-01-30 LAB — TSH: TSH: 1.94 m[IU]/L (ref 0.320–4.118)

## 2016-01-30 NOTE — Telephone Encounter (Signed)
  Oncology Nurse Navigator Documentation  Navigator Location: [facility] (01/30/16 1613) Navigator Encounter Type: Telephone (01/30/16 1613)               Barriers/Navigation Needs: Coordination of Care (01/30/16 1613)     Per Dr. Pearlie Oyster guidance, called [facility] to arrange routine follow-up visit for patient.  Spoke with Michel Bickers, requested that patient be contacted and appt arranged to see Dr. Erik Obey in 3 months.   She verbalized understanding.  Gayleen Orem, RN, BSN, [place] [facility] [place] [phone]                           Time Spent with Patient: 15 (01/30/16 1613)

## 2016-01-30 NOTE — Progress Notes (Unsigned)
I briefly met Christopher Mullins today during his radiation oncology visit with Dr. Isidore Moos. Christopher Mullins completed radiation therapy for cancer of the right tonsil on 07/04/14 and is clinically without evidence of disease today. Dr. Isidore Moos spoke with the patient about having subsequent visits alternating with myself, Dr. Isidore Moos, and Christopher Mullins for continued surveillance and follow-up care. Christopher Mullins agreed. I discussed with him the role of survivorship and my role in his post-treatment cancer care. I gave him a copy of the "Life After Cancer for Every Survivor" booklet, along with my business card and encouraged him to call me with any questions or concerns.   I will see him in 7 months with TSH. He was encouraged to see his ENT physician, Christopher Mullins, in 06/2955. I will mail Christopher Mullins survivorship/surveillance appointment with me per his request. I encouraged him to call me with any concerns and we could certainly see him sooner, if needed. I look forward to participating in his care.   Christopher Craze, NP Survivorship Program [facility] [phone]  Of note: Patient has moved to [place], but still travels to [place] quite a bit for work.

## 2016-01-30 NOTE — Progress Notes (Signed)
Radiation Oncology         [phone]) [phone] ________________________________  Name: Christopher Mullins MRN: [id number]  Date: 01/30/2016  DOB: 12-23-1955  Follow-Up Visit Note  CC: Christopher Pound, MD  Christopher Pound, MD  Diagnosis and Prior Radiotherapy:       ICD-9-CM ICD-10-CM   1. Tonsil cancer (Christopher Mullins) 146.0 C09.9   2. 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  Christopher Mullins presents for follow up of radiation completed 07/04/2014 to his Right Tonsil and bilateral neck. Denies any pain. He reports he needs to swallow with water. He states he has no difficulty other wise and does not modify his diet. Denies smoking or chewing tobacco. Reports using fluoride trays a couple times a week. He is training for a half marathon in April with his children. He reports losing some weight with his training but it is increased on our scale today compared to June. He does state he has a dry mouth, he will occasionally use biotene and some PreviDent toothpaste for dry mouth.    His last ENT visit was on 21/19/4174 with Dr. Erik Mullins. He is alternating between between Dr. Isidore Mullins and Dr. Erik Mullins about every 3-4 months.     ALLERGIES:  is allergic to sulfa antibiotics.  Meds: Current Outpatient Prescriptions  Medication Sig Dispense Refill  . clobetasol cream (TEMOVATE) 0.05 % Apply to skin as needed. 45 g 5  . Multiple Vitamin (MULTIVITAMIN) capsule Take 1 capsule by mouth daily.     No current  facility-administered medications for this encounter.    Physical Findings: The patient is in no acute distress. Patient is alert and oriented. Wt Readings from Last 3 Encounters:  01/30/16 177 lb 12.8 oz (80.65 kg)  04/23/15 166 lb 14.4 oz (75.705 kg)  01/21/15 165 lb (74.844 kg)    weight is 177 lb 12.8 oz (80.65 kg). His temperature is 97.8 F (36.6 C). His blood pressure is 132/81 and his pulse is 66. His oxygen saturation is 100%. .  General: Alert and oriented, in no acute distress HEENT: Head is normocephalic. Extraocular movements are intact. Mucous membranes are slightly dry. No thrush noted. Oropharynx is notable for no lesions or evidence of recurrence.  Neck: Neck is notable for no palpable adenopathy. Post treatment fibrosis in the right neck, not of clinical concern.  Skin: Skin in treatment fields shows satisfactory healing  Heart: Regular in rate and rhythm with no murmurs, rubs, or gallops. Chest: Clear to auscultation bilaterally, with no rhonchi, wheezes, or rales. Abdomen: Soft, nontender, nondistended, with no rigidity or guarding. Extremities: No cyanosis or edema. Lymphatics: see Neck Exam Psychiatric: Judgment and insight are intact. Affect is appropriate.   Lab Findings: Lab Results  Component Value Date   WBC 6.5 01/02/2015   HGB 11.6* 01/02/2015   HCT 35.8* 01/02/2015   MCV 94.5 01/02/2015   PLT 252 01/02/2015    Lab Results  Component Value Date   TSH 1.940 01/30/2016    Radiographic Findings: No  results found.  Impression/Plan:    1) Head and Neck Cancer Status: NED  2) Nutritional Status: no issues  3) Risk Factors: not using tobacco  4) Swallowing: no issues  5) Dental: Encouraged to continue regular followup with dentistry, and dental hygiene including fluoride rinses.   6) Thyroid function: good - lab WNL today  Lab Results  Component Value Date   TSH 1.940 01/30/2016    7) Other: The patient met with our survivorship  coordinator, Christopher Craze, NP today. The patient is planning to participate in a half marathon this April.  8) Patient also seen by Christopher Craze, NP of survivorship today.  Plan is to followup with Christopher Craze, NP in 7 months.  I encouraged patient to call me or Christopher Mullins with any concerns or questions that might arise before then.  I will see patient on a PRN basis from this point onward. Sees Dr Christopher Mullins in about 10mo  _____________________________________   Christopher Gibson MD   This document serves as a record of services personally performed by Christopher Gibson MD. It was created on her behalf by Christopher Mullins a trained medical scribe. The creation of this record is based on the scribe's personal observations and the provider's statements to them. This document has been checked and approved by the attending provider.

## 2016-01-30 NOTE — Progress Notes (Signed)
  Oncology Nurse Navigator Documentation  Navigator Location: [facility] (01/30/16 1030) Navigator Encounter Type: Follow-up Appt (01/30/16 1030)   Abnormal Finding Date: 03/25/14 (01/30/16 1030) Confirmed Diagnosis Date: 03/25/14 (01/30/16 1030)   Treatment Initiated Date: 05/06/14 (01/30/16 1030)   Treatment Phase: Post-Tx Follow-up (01/30/16 1030) Barriers/Navigation Needs: No barriers at this time (01/30/16 1030)   Interventions: None required (01/30/16 1030)     Met with [facility] during follow-up visit with Dr. Isidore Moos.  He completed XRT 07/04/14 for R tonsil ISCC.  [name], NP, Survivorship was also present.  He reported:  Physically very active, has been training for an upcoming half marathon he is running with his children.  Eating at baseline, has gained weight (11 lbs since 04/23/15 visit).  Sense of taste fully returned though he experiences variable sensitivity to spices; he cannot eat black pepper but can eat jalapeno peppers.  Seeing his dentist 4 times each year.  Using fluoride trays 3-4/week, uses Prevident toothpaste when not using trays.  He voiced understanding of Dr. Pearlie Oyster recommendation to see ENT Dr. Erik Obey in 3 months, understands I will contact [place] to initiate appt scheduling. He voiced understanding that he will see Elzie Rings in ~ 7 months. I spoke with him about upcoming April H&N Celebration and H&N [facility], encouraged him to attend.  He noted he had attended last year's Celebration.  He understands additional information is forthcoming. He understands he can contact us with needs or concerns.  Gayleen Orem, RN, BSN, [place] at [place] [address] [phone]    .                   Time Spent with Patient: 75 (01/30/16 1030)

## 2016-08-26 ENCOUNTER — Other Ambulatory Visit: Payer: Self-pay | Admitting: Adult Health

## 2016-08-26 DIAGNOSIS — R5383 Other fatigue: Secondary | ICD-10-CM

## 2016-08-27 ENCOUNTER — Other Ambulatory Visit (HOSPITAL_BASED_OUTPATIENT_CLINIC_OR_DEPARTMENT_OTHER): Payer: BLUE CROSS/BLUE SHIELD

## 2016-08-27 ENCOUNTER — Ambulatory Visit (HOSPITAL_BASED_OUTPATIENT_CLINIC_OR_DEPARTMENT_OTHER): Payer: BLUE CROSS/BLUE SHIELD | Admitting: Adult Health

## 2016-08-27 VITALS — BP 125/78 | HR 57 | Temp 97.9°F | Resp 18 | Ht 68.0 in | Wt 183.8 lb

## 2016-08-27 DIAGNOSIS — R635 Abnormal weight gain: Secondary | ICD-10-CM

## 2016-08-27 DIAGNOSIS — R5383 Other fatigue: Secondary | ICD-10-CM | POA: Diagnosis not present

## 2016-08-27 DIAGNOSIS — C090 Malignant neoplasm of tonsillar fossa: Secondary | ICD-10-CM

## 2016-08-27 DIAGNOSIS — Z23 Encounter for immunization: Secondary | ICD-10-CM

## 2016-08-27 LAB — TSH: TSH: 1.971 m(IU)/L (ref 0.320–4.118)

## 2016-08-27 MED ORDER — INFLUENZA VAC SPLIT QUAD 0.5 ML IM SUSY
0.5000 mL | PREFILLED_SYRINGE | Freq: Once | INTRAMUSCULAR | Status: AC
  Administered 2016-08-27: 0.5 mL via INTRAMUSCULAR
  Filled 2016-08-27: qty 0.5

## 2016-08-27 NOTE — Progress Notes (Signed)
CLINIC:  Survivorship  REASON FOR VISIT:  Routine follow-up for head & neck cancer.   BRIEF ONCOLOGIC HISTORY:  Oncology History   Tonsil cancer, Right, HPV positive,    Primary site: Pharynx - Oropharynx   Staging method: AJCC 7th Edition   Clinical: Stage IVA (T1, N2, M0) signed by Christopher Lark, MD on 04/17/2014  1:04 PM   Summary: Stage IVA (T1, N2, M0)       Tonsil cancer ([place] [facility])   03/20/2014 Imaging    Ct scan of neck showed several complex solid and cystic lesions within the right neck and abnormalities in the pharynx      03/25/2014 Procedure    Right tonsil biopsy confirmed squamous cell carcinoma, HPV positive      04/15/2014 Imaging    PET/CT scan showed  Asymmetric increased radiotracer uptake within the right parapharyngeal space which may represent site of primary head neck neoplasm. Multiple hypermetabolic right level 2 lymph nodes compatible with metastatic adenopathy      05/10/2014 Surgery    The patient has placement of port and feeding tube.      05/15/2014 - 06/11/2014 Chemotherapy    He received high dose cisplatin every 3 weeks. He only received 2 doses due to severe liver function test abnormalities.      05/15/2014 - 07/04/2014 Radiation Therapy    Right tonsil and bilateral neck / 70 Gy in 35 fractions to gross disease, 63 Gy in 35 fractions to high risk nodal echelons, and 56 Gy in 35 fractions to intermediate risk nodal echelons.      06/05/2014 Adverse Reaction    Cycle 2 of chemotherapy is delayed due to neutropenia & cycle 3 was abondoned due to severe hepatitis        INTERVAL HISTORY:  [facility] reports to the survivorship clinic for routine follow-up. Overall he tells me she has been feeling very well. His appetite & energy levels are good. He is sleeping well. Since his last visit, he has relocated to [place]. In 02/2016, he completed a half marathon in [place] with his children. As a result of his training, he has lost a little bit of weight.  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  He has some taste alterations, generally with certain spices. This is not troubling to him. He continues to have some xerostomia, which he manages with increased liquid intake with meals. Denies dysphagia or sore throat.  -ENT: Dr. Erik Mullins; saw him about 3-4 months ago.   -Dentist: saw primary dentist in 08/2016; sees dentist about 3x/year; using [place] [facility] toothpaste; is not doing fluoride trays consistently.  -Last TSH: Normal, 1.940 (01/30/16); denies fatigue, cold intolerance, depression, or constipation.    ADDITIONAL REVIEW OF SYSTEMS:  Review of Systems  Constitutional: Negative.   Eyes: Negative.   Respiratory: Negative.   Cardiovascular: Negative.   Gastrointestinal: Negative.   Genitourinary: Negative.   Musculoskeletal: Negative.   Skin: Negative.   Neurological: Negative.   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  Psychiatric/Behavioral: Negative.      CURRENT MEDICATIONS:  Current Outpatient Prescriptions on File Prior to Visit  Medication Sig Dispense Refill  . clobetasol cream (TEMOVATE) 0.05 % Apply to skin as needed. 45 g 5  . Multiple Vitamin (MULTIVITAMIN) capsule Take 1 capsule by mouth daily.     No current facility-administered medications  on file prior to visit.     ALLERGIES:  Allergies  Allergen Reactions  . Sulfa Antibiotics Other (See Comments)    Childhood reaction     PHYSICAL EXAM:  Vitals:   08/27/16 1148  BP: 125/78  Pulse: (!) 57  Resp: 18  Temp: 97.9 F (36.6 C)   Filed Weights   08/27/16 1148  Weight: 183 lb 12.8 oz (83.4 kg)    Weight Date  183 lb 12.8 oz (83.4 kg) 08/27/16  177 lb 12.8 oz (80.65 kg) 01/30/16  166 lb 14.4 oz (75.705 kg) 04/23/15  165 lb (74.844 kg) 01/21/15   171 lb 4.8 oz (77.701 kg)    07/10/14  176 lb 9.6 oz (80.105 kg). 06/08/14   184 lb 3.2  oz (83.553 kg)    Pre-treatment: 05/13/14   General: Well-nourished, well-appearing male in no acute distress.  Unaccompanied today.  HEENT: Head is atraumatic and normocephalic.  Pupils equal and reactive to light. Conjunctivae clear without exudate.  Sclerae anicteric. Oral mucosa is pink and moist without lesions.  Tongue pink, moist, and midline. No palpable masses to buccal mucosa, floor of mouth, or tongue.  Posterior oropharynx with mild telangectasia, consistent with post-radiation changes. No lesions.  Lymph: No cervical, supraclavicular, or infraclavicular lymphadenopathy noted on palpation.   Neck: No palpable masses. Mild fibrosis to anterior neck. Skin intact. Scant lymphedema.  Cardiovascular: Normal rate and rhythm. Respiratory: Clear to auscultation bilaterally. Chest expansion symmetric without accessory muscle use; breathing non-labored. GI: Abdomen soft and round. No tenderness to palpation. Bowel sounds normoactive.  GU: Deferred.   Neuro: No focal deficits. Steady gait.   Psych: Normal mood and affect for situation. Extremities: No edema.   Skin: Warm and dry.    LABORATORY DATA:  Results for [place], [place] [facility] (MRN [id number])   Ref. Range 08/27/2016 11:30  TSH Latest Ref Range: 0.320 - 4.118 m(IU)/L 1.971    DIAGNOSTIC IMAGING:  None at this visit.    ASSESSMENT & PLAN:  Mr. [place] [facility] is a pleasant 60 y.o. 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  Patient presents to survivorship clinic today for routine follow-up and continued surveillance.  1. Cancer of the right tonsil:  Mr. [place] [facility] is clinically without evidence of disease on physical exam today. He will follow-up with his ENT physician, Dr. Erik Mullins , in [id number], or sooner if indicated by Dr.Wolicki . If he sees Dr. Erik Mullins in November/December 2017 and again  in 04/2017, then he will be due to be seen at the cancer center in 10/2017.  We discussed that he will likely need visits every 6, alternating between ENT at the cancer center.  He asked for our help in getting his appointments scheduled with his ENT physicians; I will ask Gayleen Orem, RN to help make arrangements for the patient. Therefore, he will return to the survivorship clinic in 10/2017. I encouraged him to call me with any questions or concerns, and I would be happy to see him sooner if needed.  2. At risk for hypothyroidism: Mr. Oldman TSH remains normal today. We discussed the importance of TSH serial monitoring at least annually. We will continue to collect TSH labs annually here at the [facility]. He understands that after he completes his 5 years of follow-up here, that his PCP will need to check his TSH at least once per year for the  rest of his life. We discussed the reasoning behind his risk for hypothyroidism, given his history of radiation therapy for head and neck cancer.   3. Dental care: I encouraged Mr. Gaddis to use his fluoride trays. He understands the importance of oral hygiene/oral health, in the setting of being a head and neck cancer survivor. He sees his dentist regularly, which I commended, and encouraged him to continue to do so. I reiterated that should he ever need oral surgery/dental extractions, that his primary dentist should have a conversation with Dr. Enrique Sack prior to any invasive oral surgeries. He voiced understanding and agreed with this plan.   4. Health maintenance/Wellness promotion: Encouraged him to continue maintenance visits with his PCP. He received his annual flu shot today in our clinic before leaving the [facility]. I encouraged him to keep up the good work with his physical activity, and his healthy diet. He understands the importance of avoiding tobacco and alcohol as it relates to his history of head and neck cancer.   Dispo:  -Return to  cancer center in 10/2017 (to coordinate every 6 month visits alternating with ENT) to see Survivorship NP.      A total of 20 minutes was spent in the face-to-face care of this patient, with greater than 50% of that time spent in counseling and care-coordination.   [name], NP [place] [phone]

## 2016-08-30 ENCOUNTER — Encounter: Payer: Self-pay | Admitting: Adult Health

## 2016-09-01 ENCOUNTER — Telehealth: Payer: Self-pay | Admitting: *Deleted

## 2016-09-01 NOTE — Telephone Encounter (Signed)
Oncology Nurse Navigator Documentation  Per Dr. Pearlie Oyster guidance, called [facility] to arrange routine follow-up visit.  Spoke with [place], requested [name] be contacted and appt arranged to see Dr. Erik Obey in [place] [phone]. She verbalized understanding.  Gayleen Orem, RN, BSN, [place] at [place] [phone]

## 2017-10-28 ENCOUNTER — Encounter: Payer: Self-pay | Admitting: Adult Health

## 2017-10-28 ENCOUNTER — Other Ambulatory Visit: Payer: Self-pay | Admitting: Hematology and Oncology

## 2017-10-28 ENCOUNTER — Other Ambulatory Visit: Payer: BLUE CROSS/BLUE SHIELD

## 2017-10-28 ENCOUNTER — Ambulatory Visit: Payer: BLUE CROSS/BLUE SHIELD | Admitting: Nurse Practitioner

## 2017-10-28 DIAGNOSIS — C090 Malignant neoplasm of tonsillar fossa: Secondary | ICD-10-CM

## 2017-10-29 ENCOUNTER — Telehealth: Payer: Self-pay | Admitting: Hematology and Oncology

## 2017-10-29 NOTE — Telephone Encounter (Signed)
Scheduled appt per 12/14 sch message - sent reminder letter in the mail with appt date and time - patient is aware of appt added.

## 2017-12-01 ENCOUNTER — Other Ambulatory Visit: Payer: Self-pay | Admitting: *Deleted

## 2017-12-01 ENCOUNTER — Encounter: Payer: Self-pay | Admitting: Hematology and Oncology

## 2017-12-01 ENCOUNTER — Inpatient Hospital Stay: Payer: BLUE CROSS/BLUE SHIELD

## 2017-12-01 ENCOUNTER — Inpatient Hospital Stay: Payer: BLUE CROSS/BLUE SHIELD | Attending: Hematology and Oncology | Admitting: Hematology and Oncology

## 2017-12-01 VITALS — BP 153/81 | HR 69 | Temp 97.7°F | Resp 18 | Ht 68.0 in | Wt 193.1 lb

## 2017-12-01 DIAGNOSIS — K7460 Unspecified cirrhosis of liver: Secondary | ICD-10-CM

## 2017-12-01 DIAGNOSIS — K7469 Other cirrhosis of liver: Secondary | ICD-10-CM

## 2017-12-01 DIAGNOSIS — Z299 Encounter for prophylactic measures, unspecified: Secondary | ICD-10-CM

## 2017-12-01 DIAGNOSIS — B1920 Unspecified viral hepatitis C without hepatic coma: Secondary | ICD-10-CM

## 2017-12-01 DIAGNOSIS — C099 Malignant neoplasm of tonsil, unspecified: Secondary | ICD-10-CM

## 2017-12-01 DIAGNOSIS — Z923 Personal history of irradiation: Secondary | ICD-10-CM

## 2017-12-01 DIAGNOSIS — I890 Lymphedema, not elsewhere classified: Secondary | ICD-10-CM

## 2017-12-01 DIAGNOSIS — Z882 Allergy status to sulfonamides status: Secondary | ICD-10-CM

## 2017-12-01 DIAGNOSIS — R682 Dry mouth, unspecified: Secondary | ICD-10-CM

## 2017-12-01 DIAGNOSIS — C090 Malignant neoplasm of tonsillar fossa: Secondary | ICD-10-CM

## 2017-12-01 DIAGNOSIS — Z79899 Other long term (current) drug therapy: Secondary | ICD-10-CM

## 2017-12-01 DIAGNOSIS — Z9221 Personal history of antineoplastic chemotherapy: Secondary | ICD-10-CM

## 2017-12-01 DIAGNOSIS — Z931 Gastrostomy status: Secondary | ICD-10-CM | POA: Diagnosis not present

## 2017-12-01 DIAGNOSIS — Z8619 Personal history of other infectious and parasitic diseases: Secondary | ICD-10-CM

## 2017-12-01 LAB — CBC WITH DIFFERENTIAL/PLATELET
Basophils Absolute: 0 10*3/uL (ref 0.0–0.1)
Basophils Relative: 1 %
EOS ABS: 0.2 10*3/uL (ref 0.0–0.5)
Eosinophils Relative: 4 %
HEMATOCRIT: 41.7 % (ref 38.4–49.9)
Hemoglobin: 14.1 g/dL (ref 13.0–17.1)
LYMPHS ABS: 2.2 10*3/uL (ref 0.9–3.3)
Lymphocytes Relative: 40 %
MCH: 31.1 pg (ref 27.2–33.4)
MCHC: 33.8 g/dL (ref 32.0–36.0)
MCV: 92.1 fL (ref 79.3–98.0)
MONOS PCT: 9 %
Monocytes Absolute: 0.5 10*3/uL (ref 0.1–0.9)
NEUTROS ABS: 2.5 10*3/uL (ref 1.5–6.5)
Neutrophils Relative %: 46 %
Platelets: 190 10*3/uL (ref 140–400)
RBC: 4.53 MIL/uL (ref 4.20–5.82)
RDW: 13 % (ref 11.0–15.6)
WBC: 5.5 10*3/uL (ref 4.0–10.3)

## 2017-12-01 LAB — COMPREHENSIVE METABOLIC PANEL
ALT: 23 U/L (ref 0–55)
AST: 29 U/L (ref 5–34)
Albumin: 4.2 g/dL (ref 3.5–5.0)
Alkaline Phosphatase: 43 U/L (ref 40–150)
Anion gap: 7 (ref 3–11)
BUN: 16 mg/dL (ref 7–26)
CHLORIDE: 104 mmol/L (ref 98–109)
CO2: 29 mmol/L (ref 22–29)
CREATININE: 1.24 mg/dL (ref 0.70–1.30)
Calcium: 9.3 mg/dL (ref 8.4–10.4)
GFR calc Af Amer: >60 mL/min (ref >60)
GFR calc non Af Amer: >60 mL/min (ref >60)
Glucose, Bld: 103 mg/dL (ref 70–140)
Potassium: 4.4 mmol/L (ref 3.5–5.1)
Sodium: 140 mmol/L (ref 136–145)
Total Bilirubin: 0.4 mg/dL (ref 0.2–1.2)
Total Protein: 7.5 g/dL (ref 6.4–8.3)

## 2017-12-02 ENCOUNTER — Telehealth: Payer: Self-pay | Admitting: *Deleted

## 2017-12-02 ENCOUNTER — Telehealth: Payer: Self-pay | Admitting: Hematology and Oncology

## 2017-12-02 ENCOUNTER — Encounter: Payer: Self-pay | Admitting: Hematology and Oncology

## 2017-12-02 DIAGNOSIS — Z299 Encounter for prophylactic measures, unspecified: Secondary | ICD-10-CM | POA: Insufficient documentation

## 2017-12-02 LAB — AFP TUMOR MARKER: AFP, Serum, Tumor Marker: 2.9 ng/mL (ref 0.0–8.3)

## 2017-12-02 LAB — TSH: TSH: 2.08 u[IU]/mL (ref 0.320–4.118)

## 2017-12-02 NOTE — Assessment & Plan Note (Signed)
The patient was successfully treated for hepatitis C I told him about the risk of hepatoma given signs of early cirrhosis Screening for hepatoma with alpha-fetoprotein is negative I recommend baseline repeat ultrasound of the liver to be done by his local physician in the near future His last ultrasound in 2015 showed signs of liver cirrhosis

## 2017-12-02 NOTE — Telephone Encounter (Signed)
No 1/17 los -  

## 2017-12-02 NOTE — Assessment & Plan Note (Signed)
He has no clinical signs of cancer recurrence There is no benefit for routine surveillance imaging study I reinforced the importance of close ENT visit I will discharge the patient

## 2017-12-02 NOTE — Assessment & Plan Note (Signed)
The patient has gained a lot of weight since the last time I saw him  He is mildly hypertensive today but it could be due to whitecoat hypertension We discussed the importance of exercise and dietary modification I reinforced the importance of yearly influenza vaccination Given his prior exposure to radiation treatment, we discussed the importance of oral hygiene and frequent dental visits I also recommend minimum annual TSH through his primary care physician.  Repeat TSH today is within normal limits

## 2017-12-02 NOTE — Progress Notes (Signed)
[place] OFFICE PROGRESS NOTE  Patient Care Team: Leota Sauers, RN as Oncology Nurse Navigator (Oncology)  SUMMARY OF ONCOLOGIC HISTORY: Oncology History   Tonsil cancer, Right, HPV positive,    Primary site: Pharynx - Oropharynx   Staging method: AJCC 7th Edition   Clinical: Stage IVA (T1, N2, M0) signed by Heath Lark, MD on 04/17/2014  1:04 PM   Summary: Stage IVA (T1, N2, M0)       Tonsil cancer ([place])   03/20/2014 Imaging    Ct scan of neck showed several complex solid and cystic lesions within the right neck and abnormalities in the pharynx      03/25/2014 Procedure    Right tonsil biopsy confirmed squamous cell carcinoma, HPV positive      04/15/2014 Imaging    PET/CT scan showed  Asymmetric increased radiotracer uptake within the right parapharyngeal space which may represent site of primary head neck neoplasm. Multiple hypermetabolic right level 2 lymph nodes compatible with metastatic adenopathy      05/10/2014 Surgery    The patient has placement of port and feeding tube.      05/15/2014 - 06/11/2014 Chemotherapy    He received high dose cisplatin every 3 weeks. He only received 2 doses due to severe liver function test abnormalities.      05/15/2014 - 07/04/2014 Radiation Therapy    Right tonsil and bilateral neck / 70 Gy in 35 fractions to gross disease, 63 Gy in 35 fractions to high risk nodal echelons, and 56 Gy in 35 fractions to intermediate risk nodal echelons.      06/05/2014 Adverse Reaction    Cycle 2 of chemotherapy is delayed due to neutropenia & cycle 3 was abondoned due to severe hepatitis       INTERVAL HISTORY: Please see below for problem oriented charting. He is seen for further follow-up He is doing very well His last ENT visit was in December 2017 and it was normal He denies recent dental issue Denies swallowing difficulties He had mild sensation of chronic dry mouth He was successfully treated for hepatitis C.  The patient  has relocated to [place].  He has retired from his job. He has run several marathons since the last time I saw him the patient has a very different focus in life and appears to be thinking positively He denies recent infection No new lymphadenopathy  REVIEW OF SYSTEMS:   Constitutional: Denies fevers, chills or abnormal weight loss Eyes: Denies blurriness of vision Ears, nose, mouth, throat, and face: Denies mucositis or sore throat Respiratory: Denies cough, dyspnea or wheezes Cardiovascular: Denies palpitation, chest discomfort or lower extremity swelling Gastrointestinal:  Denies nausea, heartburn or change in bowel habits Skin: Denies abnormal skin rashes Lymphatics: Denies new lymphadenopathy or easy bruising Neurological:Denies numbness, tingling or new weaknesses Behavioral/Psych: Mood is stable, no new changes  All other systems were reviewed with the patient and are negative.  I have reviewed the past medical history, past surgical history, social history and family history with the patient and they are unchanged from previous note.  ALLERGIES:  is allergic to sulfa antibiotics.  MEDICATIONS:  Current Outpatient Medications  Medication Sig Dispense Refill  . clobetasol cream (TEMOVATE) 0.05 % Apply to skin as needed. (Patient not taking: Reported on 08/27/2016) 45 g 5  . Multiple Vitamin (MULTIVITAMIN) capsule Take 1 capsule by mouth daily.     No current facility-administered medications for this visit.     PHYSICAL EXAMINATION: ECOG PERFORMANCE  STATUS: 0 - Asymptomatic  Vitals:   12/01/17 1509  BP: (!) 153/81  Pulse: 69  Resp: 18  Temp: 97.7 F (36.5 C)  SpO2: 100%   Filed Weights   12/01/17 1509  Weight: 193 lb 1.6 oz (87.6 kg)    GENERAL:alert, no distress and comfortable SKIN: skin color, texture, turgor are normal, no rashes or significant lesions EYES: normal, Conjunctiva are pink and non-injected, sclera clear OROPHARYNX:no exudate, no erythema  and lips, buccal mucosa, and tongue normal  NECK: Noted minimum lymphedema LYMPH:  no palpable lymphadenopathy in the cervical, axillary or inguinal LUNGS: clear to auscultation and percussion with normal breathing effort HEART: regular rate & rhythm and no murmurs and no lower extremity edema ABDOMEN:abdomen soft, non-tender and normal bowel sounds Musculoskeletal:no cyanosis of digits and no clubbing  NEURO: alert & oriented x 3 with fluent speech, no focal motor/sensory deficits  LABORATORY DATA:  I have reviewed the data as listed    Component Value Date/Time   NA 140 12/01/2017 1447   NA 138 09/03/2014 1216   K 4.4 12/01/2017 1447   K 4.1 09/03/2014 1216   CL 104 12/01/2017 1447   CO2 29 12/01/2017 1447   CO2 26 09/03/2014 1216   GLUCOSE 103 12/01/2017 1447   GLUCOSE 107 09/03/2014 1216   BUN 16 12/01/2017 1447   BUN 17.0 09/03/2014 1216   CREATININE 1.24 12/01/2017 1447   CREATININE 0.96 10/29/2014 1043   CREATININE 0.8 09/03/2014 1216   CALCIUM 9.3 12/01/2017 1447   CALCIUM 9.5 09/03/2014 1216   PROT 7.5 12/01/2017 1447   PROT 7.0 09/03/2014 1216   ALBUMIN 4.2 12/01/2017 1447   ALBUMIN 3.2 (L) 09/03/2014 1216   AST 29 12/01/2017 1447   AST 284 (HH) 09/03/2014 1216   ALT 23 12/01/2017 1447   ALT 430 (HH) 09/03/2014 1216   ALKPHOS 43 12/01/2017 1447   ALKPHOS 50 09/03/2014 1216   BILITOT 0.4 12/01/2017 1447   BILITOT 0.47 09/03/2014 1216   GFRNONAA >60 12/01/2017 1447   GFRNONAA 88 10/29/2014 1043   GFRAA >60 12/01/2017 1447   GFRAA >89 10/29/2014 1043    No results found for: SPEP, UPEP  Lab Results  Component Value Date   WBC 5.5 12/01/2017   NEUTROABS 2.5 12/01/2017   HGB 14.1 12/01/2017   HCT 41.7 12/01/2017   MCV 92.1 12/01/2017   PLT 190 12/01/2017      Chemistry      Component Value Date/Time   NA 140 12/01/2017 1447   NA 138 09/03/2014 1216   K 4.4 12/01/2017 1447   K 4.1 09/03/2014 1216   CL 104 12/01/2017 1447   CO2 29 12/01/2017 1447    CO2 26 09/03/2014 1216   BUN 16 12/01/2017 1447   BUN 17.0 09/03/2014 1216   CREATININE 1.24 12/01/2017 1447   CREATININE 0.96 10/29/2014 1043   CREATININE 0.8 09/03/2014 1216      Component Value Date/Time   CALCIUM 9.3 12/01/2017 1447   CALCIUM 9.5 09/03/2014 1216   ALKPHOS 43 12/01/2017 1447   ALKPHOS 50 09/03/2014 1216   AST 29 12/01/2017 1447   AST 284 (HH) 09/03/2014 1216   ALT 23 12/01/2017 1447   ALT 430 (HH) 09/03/2014 1216   BILITOT 0.4 12/01/2017 1447   BILITOT 0.47 09/03/2014 1216       ASSESSMENT & PLAN:  Tonsil cancer He has no clinical signs of cancer recurrence There is no benefit for routine surveillance imaging study I reinforced the  importance of close ENT visit I will discharge the patient  History of hepatitis C The patient was successfully treated for hepatitis C I told him about the risk of hepatoma given signs of early cirrhosis Screening for hepatoma with alpha-fetoprotein is negative I recommend baseline repeat ultrasound of the liver to be done by his local physician in the near future His last ultrasound in 2015 showed signs of liver cirrhosis  Preventive measure The patient has gained a lot of weight since the last time I saw him  He is mildly hypertensive today but it could be due to whitecoat hypertension We discussed the importance of exercise and dietary modification I reinforced the importance of yearly influenza vaccination Given his prior exposure to radiation treatment, we discussed the importance of oral hygiene and frequent dental visits I also recommend minimum annual TSH through his primary care physician.  Repeat TSH today is within normal limits   Orders Placed This Encounter  Procedures  . AFP tumor marker    Standing Status:   Future    Number of Occurrences:   1    Standing Expiration Date:   01/05/2019   All questions were answered. The patient knows to call the clinic with any problems, questions or concerns. No  barriers to learning was detected. I spent 15 minutes counseling the patient face to face. The total time spent in the appointment was 20 minutes and more than 50% was on counseling and review of test results     Heath Lark, MD 12/02/2017 6:23 PM

## 2017-12-02 NOTE — Telephone Encounter (Signed)
-----   Message from Heath Lark, MD sent at 12/02/2017 10:46 AM EST ----- Regarding: labs Pls let him know TSH and alpha feto protein levels are normal ----- Message ----- From: Interface, Lab In [facility] Sent: 12/01/2017   3:04 PM To: Heath Lark, MD

## 2017-12-02 NOTE — Telephone Encounter (Signed)
As noted below by Dr. Alvy Bimler, I left a message informing patient that his labs are normal. Instructed him to call [facility] if he had any questions or concerns.

## 2022-06-08 ENCOUNTER — Other Ambulatory Visit: Payer: Self-pay | Admitting: Radiation Oncology

## 2022-06-08 ENCOUNTER — Ambulatory Visit
Admission: RE | Admit: 2022-06-08 | Discharge: 2022-06-08 | Disposition: A | Discharge status: Home / Self Care | Payer: Self-pay | Source: Ambulatory Visit | Attending: Radiation Oncology | Admitting: Radiation Oncology

## 2022-06-08 DIAGNOSIS — C099 Malignant neoplasm of tonsil, unspecified: Secondary | ICD-10-CM
